# Patient Record
Sex: Female | Born: 1935 | Race: White | Hispanic: No | Smoking: Former smoker
Health system: Southern US, Community
[De-identification: ages and names within clinical notes are randomized; demographics above are authoritative.]

## PROBLEM LIST (undated history)

## (undated) DIAGNOSIS — F0280 Dementia in other diseases classified elsewhere without behavioral disturbance: Secondary | ICD-10-CM

## (undated) DIAGNOSIS — I4891 Unspecified atrial fibrillation: Secondary | ICD-10-CM

## (undated) DIAGNOSIS — I739 Peripheral vascular disease, unspecified: Secondary | ICD-10-CM

## (undated) DIAGNOSIS — J449 Chronic obstructive pulmonary disease, unspecified: Secondary | ICD-10-CM

## (undated) DIAGNOSIS — Z7901 Long term (current) use of anticoagulants: Secondary | ICD-10-CM

## (undated) DIAGNOSIS — K219 Gastro-esophageal reflux disease without esophagitis: Secondary | ICD-10-CM

## (undated) DIAGNOSIS — Z973 Presence of spectacles and contact lenses: Secondary | ICD-10-CM

## (undated) DIAGNOSIS — N289 Disorder of kidney and ureter, unspecified: Secondary | ICD-10-CM

## (undated) DIAGNOSIS — I2510 Atherosclerotic heart disease of native coronary artery without angina pectoris: Secondary | ICD-10-CM

## (undated) DIAGNOSIS — I10 Essential (primary) hypertension: Secondary | ICD-10-CM

## (undated) DIAGNOSIS — G459 Transient cerebral ischemic attack, unspecified: Secondary | ICD-10-CM

## (undated) DIAGNOSIS — I509 Heart failure, unspecified: Secondary | ICD-10-CM

## (undated) DIAGNOSIS — F17201 Nicotine dependence, unspecified, in remission: Secondary | ICD-10-CM

## (undated) DIAGNOSIS — E785 Hyperlipidemia, unspecified: Secondary | ICD-10-CM

## (undated) DIAGNOSIS — I679 Cerebrovascular disease, unspecified: Secondary | ICD-10-CM

## (undated) DIAGNOSIS — H9190 Unspecified hearing loss, unspecified ear: Secondary | ICD-10-CM

## (undated) DIAGNOSIS — Z970 Presence of artificial eye: Secondary | ICD-10-CM

## (undated) DIAGNOSIS — M40209 Unspecified kyphosis, site unspecified: Secondary | ICD-10-CM

## (undated) DIAGNOSIS — G309 Alzheimer's disease, unspecified: Secondary | ICD-10-CM

## (undated) HISTORY — DX: Unspecified kyphosis, site unspecified: M40.209

## (undated) HISTORY — PX: ENUCLEATION: SHX628

## (undated) HISTORY — PX: ABDOMINAL HYSTERECTOMY: SHX81

## (undated) HISTORY — DX: Long term (current) use of anticoagulants: Z79.01

## (undated) HISTORY — DX: Peripheral vascular disease, unspecified: I73.9

## (undated) HISTORY — DX: Heart failure, unspecified: I50.9

## (undated) HISTORY — DX: Unspecified atrial fibrillation: I48.91

## (undated) HISTORY — DX: Essential (primary) hypertension: I10

## (undated) HISTORY — PX: TONSILLECTOMY: SUR1361

## (undated) HISTORY — DX: Atherosclerotic heart disease of native coronary artery without angina pectoris: I25.10

## (undated) HISTORY — DX: Nicotine dependence, unspecified, in remission: F17.201

## (undated) HISTORY — DX: Cerebrovascular disease, unspecified: I67.9

## (undated) HISTORY — PX: APPENDECTOMY: SHX54

## (undated) HISTORY — DX: Hyperlipidemia, unspecified: E78.5

---

## 1998-03-12 ENCOUNTER — Ambulatory Visit: Admission: RE | Admit: 1998-03-12 | Discharge: 1998-03-12 | Payer: Self-pay | Admitting: Cardiovascular Disease

## 2002-01-29 ENCOUNTER — Ambulatory Visit (HOSPITAL_COMMUNITY): Admission: RE | Admit: 2002-01-29 | Discharge: 2002-01-29 | Payer: Self-pay | Admitting: Family Medicine

## 2002-01-29 ENCOUNTER — Encounter: Payer: Self-pay | Admitting: Family Medicine

## 2003-11-10 ENCOUNTER — Ambulatory Visit (HOSPITAL_COMMUNITY): Admission: RE | Admit: 2003-11-10 | Discharge: 2003-11-10 | Payer: Self-pay | Admitting: Ophthalmology

## 2005-06-13 ENCOUNTER — Ambulatory Visit (HOSPITAL_COMMUNITY): Admission: RE | Admit: 2005-06-13 | Discharge: 2005-06-13 | Payer: Self-pay | Admitting: Family Medicine

## 2005-10-30 ENCOUNTER — Emergency Department (HOSPITAL_COMMUNITY): Admission: EM | Admit: 2005-10-30 | Discharge: 2005-10-30 | Payer: Self-pay | Admitting: Emergency Medicine

## 2005-11-01 ENCOUNTER — Ambulatory Visit: Payer: Self-pay | Admitting: *Deleted

## 2005-11-08 ENCOUNTER — Ambulatory Visit: Payer: Self-pay | Admitting: *Deleted

## 2005-11-08 ENCOUNTER — Encounter (HOSPITAL_COMMUNITY): Admission: RE | Admit: 2005-11-08 | Discharge: 2005-12-08 | Payer: Self-pay | Admitting: *Deleted

## 2005-11-25 ENCOUNTER — Ambulatory Visit: Payer: Self-pay | Admitting: Cardiology

## 2006-03-12 ENCOUNTER — Encounter: Payer: Self-pay | Admitting: Emergency Medicine

## 2006-03-12 ENCOUNTER — Inpatient Hospital Stay (HOSPITAL_COMMUNITY): Admission: EM | Admit: 2006-03-12 | Discharge: 2006-03-20 | Payer: Self-pay | Admitting: Cardiovascular Disease

## 2006-03-12 ENCOUNTER — Ambulatory Visit: Payer: Self-pay | Admitting: Pulmonary Disease

## 2006-03-17 ENCOUNTER — Encounter: Payer: Self-pay | Admitting: Cardiovascular Disease

## 2006-03-17 ENCOUNTER — Ambulatory Visit: Payer: Self-pay | Admitting: Cardiovascular Disease

## 2006-11-09 ENCOUNTER — Emergency Department (HOSPITAL_COMMUNITY): Admission: EM | Admit: 2006-11-09 | Discharge: 2006-11-09 | Payer: Self-pay | Admitting: Emergency Medicine

## 2006-12-04 ENCOUNTER — Ambulatory Visit: Payer: Self-pay | Admitting: Cardiology

## 2006-12-07 ENCOUNTER — Ambulatory Visit (HOSPITAL_COMMUNITY): Admission: RE | Admit: 2006-12-07 | Discharge: 2006-12-07 | Payer: Self-pay | Admitting: Cardiology

## 2007-11-08 ENCOUNTER — Ambulatory Visit (HOSPITAL_COMMUNITY): Admission: RE | Admit: 2007-11-08 | Discharge: 2007-11-08 | Payer: Self-pay | Admitting: Family Medicine

## 2008-02-05 ENCOUNTER — Ambulatory Visit: Payer: Self-pay | Admitting: Cardiology

## 2008-10-01 ENCOUNTER — Emergency Department (HOSPITAL_COMMUNITY): Admission: EM | Admit: 2008-10-01 | Discharge: 2008-10-01 | Payer: Self-pay | Admitting: Emergency Medicine

## 2008-11-25 ENCOUNTER — Ambulatory Visit (HOSPITAL_COMMUNITY): Admission: RE | Admit: 2008-11-25 | Discharge: 2008-11-25 | Payer: Self-pay | Admitting: Family Medicine

## 2009-02-20 ENCOUNTER — Encounter: Payer: Self-pay | Admitting: Cardiology

## 2009-02-20 ENCOUNTER — Ambulatory Visit: Payer: Self-pay | Admitting: Cardiology

## 2009-02-20 DIAGNOSIS — M4000 Postural kyphosis, site unspecified: Secondary | ICD-10-CM | POA: Insufficient documentation

## 2009-07-07 ENCOUNTER — Encounter (INDEPENDENT_AMBULATORY_CARE_PROVIDER_SITE_OTHER): Payer: Self-pay | Admitting: *Deleted

## 2009-07-07 LAB — CONVERTED CEMR LAB
MCV: 98.1 fL
Platelets: 257 10*3/uL
WBC: 8.7 10*3/uL

## 2009-07-08 ENCOUNTER — Encounter (INDEPENDENT_AMBULATORY_CARE_PROVIDER_SITE_OTHER): Payer: Self-pay | Admitting: *Deleted

## 2009-09-08 ENCOUNTER — Emergency Department (HOSPITAL_COMMUNITY): Admission: EM | Admit: 2009-09-08 | Discharge: 2009-09-08 | Payer: Self-pay | Admitting: Emergency Medicine

## 2009-12-14 ENCOUNTER — Ambulatory Visit (HOSPITAL_COMMUNITY): Admission: RE | Admit: 2009-12-14 | Discharge: 2009-12-14 | Payer: Self-pay | Admitting: Family Medicine

## 2010-02-09 ENCOUNTER — Encounter (INDEPENDENT_AMBULATORY_CARE_PROVIDER_SITE_OTHER): Payer: Self-pay | Admitting: *Deleted

## 2010-02-09 LAB — CONVERTED CEMR LAB
AST: 17 units/L
BUN: 24 mg/dL
Basophils Relative: 0 %
CO2: 33 meq/L
GFR calc non Af Amer: 37 mL/min
Glomerular Filtration Rate, Af Am: 44 mL/min/{1.73_m2}
Glucose, Bld: 87 mg/dL
Lymphocytes Relative: 16 %
Lymphs Abs: 1.9 10*3/uL
Platelets: 249 10*3/uL
Sodium: 144 meq/L
Total Protein: 6.6 g/dL

## 2010-02-19 ENCOUNTER — Ambulatory Visit (HOSPITAL_COMMUNITY): Admission: RE | Admit: 2010-02-19 | Discharge: 2010-02-19 | Payer: Self-pay | Admitting: Family Medicine

## 2010-03-30 ENCOUNTER — Encounter (INDEPENDENT_AMBULATORY_CARE_PROVIDER_SITE_OTHER): Payer: Self-pay | Admitting: *Deleted

## 2010-03-31 ENCOUNTER — Ambulatory Visit: Payer: Self-pay | Admitting: Cardiology

## 2010-03-31 DIAGNOSIS — I10 Essential (primary) hypertension: Secondary | ICD-10-CM | POA: Insufficient documentation

## 2010-03-31 DIAGNOSIS — F0390 Unspecified dementia without behavioral disturbance: Secondary | ICD-10-CM | POA: Insufficient documentation

## 2010-03-31 DIAGNOSIS — E785 Hyperlipidemia, unspecified: Secondary | ICD-10-CM | POA: Insufficient documentation

## 2010-04-02 ENCOUNTER — Ambulatory Visit (HOSPITAL_COMMUNITY): Admission: RE | Admit: 2010-04-02 | Discharge: 2010-04-02 | Payer: Self-pay | Admitting: Cardiology

## 2010-04-09 ENCOUNTER — Encounter (INDEPENDENT_AMBULATORY_CARE_PROVIDER_SITE_OTHER): Payer: Self-pay | Admitting: *Deleted

## 2010-05-07 ENCOUNTER — Emergency Department (HOSPITAL_COMMUNITY): Admission: EM | Admit: 2010-05-07 | Discharge: 2010-05-07 | Payer: Self-pay | Admitting: Emergency Medicine

## 2010-07-16 ENCOUNTER — Encounter (INDEPENDENT_AMBULATORY_CARE_PROVIDER_SITE_OTHER): Payer: Self-pay | Admitting: *Deleted

## 2010-07-16 ENCOUNTER — Ambulatory Visit (HOSPITAL_COMMUNITY): Admission: RE | Admit: 2010-07-16 | Discharge: 2010-07-16 | Payer: Self-pay | Admitting: Internal Medicine

## 2010-07-16 LAB — CONVERTED CEMR LAB
BUN: 15 mg/dL
Basophils Absolute: 0.1 10*3/uL
CO2: 29 meq/L
Calcium: 9.7 mg/dL
Creatinine, Ser: 1.18 mg/dL
Eosinophils Absolute: 0.3 10*3/uL
Eosinophils Relative: 4 %
GFR calc non Af Amer: 43 mL/min
HCT: 44.3 %
Hemoglobin: 13.8 g/dL
Monocytes Absolute: 0.8 10*3/uL
RDW: 13.7 %
Sodium: 144 meq/L

## 2010-07-20 ENCOUNTER — Encounter (INDEPENDENT_AMBULATORY_CARE_PROVIDER_SITE_OTHER): Payer: Self-pay | Admitting: *Deleted

## 2010-07-20 ENCOUNTER — Ambulatory Visit: Payer: Self-pay | Admitting: Cardiology

## 2010-08-01 ENCOUNTER — Observation Stay (HOSPITAL_COMMUNITY)
Admission: EM | Admit: 2010-08-01 | Discharge: 2010-08-03 | Payer: Self-pay | Source: Home / Self Care | Admitting: Emergency Medicine

## 2010-08-01 ENCOUNTER — Ambulatory Visit: Payer: Self-pay | Admitting: Cardiology

## 2010-08-02 ENCOUNTER — Encounter: Payer: Self-pay | Admitting: Cardiology

## 2010-08-02 ENCOUNTER — Encounter (INDEPENDENT_AMBULATORY_CARE_PROVIDER_SITE_OTHER): Payer: Self-pay | Admitting: Internal Medicine

## 2010-08-03 ENCOUNTER — Telehealth (INDEPENDENT_AMBULATORY_CARE_PROVIDER_SITE_OTHER): Payer: Self-pay | Admitting: *Deleted

## 2010-08-05 ENCOUNTER — Ambulatory Visit (HOSPITAL_COMMUNITY): Admission: RE | Admit: 2010-08-05 | Payer: Self-pay | Admitting: Cardiology

## 2010-08-13 ENCOUNTER — Ambulatory Visit: Payer: Self-pay | Admitting: Cardiology

## 2010-09-16 ENCOUNTER — Ambulatory Visit: Payer: Self-pay | Admitting: Adult Health

## 2010-10-21 ENCOUNTER — Encounter (INDEPENDENT_AMBULATORY_CARE_PROVIDER_SITE_OTHER): Payer: Self-pay | Admitting: *Deleted

## 2010-10-21 ENCOUNTER — Ambulatory Visit
Admission: RE | Admit: 2010-10-21 | Discharge: 2010-10-21 | Payer: Self-pay | Source: Home / Self Care | Attending: Cardiology | Admitting: Cardiology

## 2010-11-04 NOTE — Progress Notes (Signed)
Summary: PT IS NOT GOING TO DO PFT  Phone Note Call from Patient Call back at Home Phone [phone]   Caller: PT Reason for Call: Lab or Test Results Summary of Call: PT AND FAMILY HAVE DECIDED NOT TO DO PFT. Initial call taken by: Faythe Ghee,  August 03, 2010 2:08 PM  Follow-up for Phone Call        cancelled test Follow-up by: Teressa Lower RN,  August 03, 2010 5:49 PM

## 2010-11-04 NOTE — Miscellaneous (Signed)
Summary: labs cmp,cbcd,bnp,07/16/2010  Clinical Lists Changes  Observations: Added new observation of BNP: 187.3  (07/16/2010 8:17) Added new observation of ABSOLUTE BAS: 0.1 K/uL (07/16/2010 8:17) Added new observation of BASOPHIL %: 1 % (07/16/2010 8:17) Added new observation of EOS ABSLT: 0.3 K/uL (07/16/2010 8:17) Added new observation of % EOS AUTO: 4 % (07/16/2010 8:17) Added new observation of ABSOLUTE MON: 0.8 K/uL (07/16/2010 8:17) Added new observation of MONOCYTE %: 9 % (07/16/2010 8:17) Added new observation of ABS LYMPHOCY: 2.0 K/uL (07/16/2010 8:17) Added new observation of PLATELETK/UL: 248 K/uL (07/16/2010 8:17) Added new observation of RDW: 13.7 % (07/16/2010 8:17) Added new observation of MCHC RBC: 31.2 g/dL (09/81/1914 7:82) Added new observation of MCV: 94.7 fL (07/16/2010 8:17) Added new observation of HCT: 44.3 % (07/16/2010 8:17) Added new observation of HGB: 13.8 g/dL (95/62/1308 6:57) Added new observation of RBC M/UL: 4.68 M/uL (07/16/2010 8:17) Added new observation of WBC COUNT: 9.8 10*3/microliter (07/16/2010 8:17)

## 2010-11-04 NOTE — Miscellaneous (Signed)
Summary: labs cmp 07/16/2010  Clinical Lists Changes  Observations: Added new observation of CALCIUM: 9.7 mg/dL (16/07/9603 5:40) Added new observation of ALBUMIN: 4.1 g/dL (98/08/9146 8:29) Added new observation of PROTEIN, TOT: 6.4 g/dL (56/21/3086 5:78) Added new observation of SGPT (ALT): 10 units/L (07/16/2010 8:23) Added new observation of SGOT (AST): 13 units/L (07/16/2010 8:23) Added new observation of ALK PHOS: 109 units/L (07/16/2010 8:23) Added new observation of GFR AA: 52 mL/min/1.34m2 (07/16/2010 8:23) Added new observation of GFR: 43 mL/min (07/16/2010 8:23) Added new observation of CREATININE: 1.18 mg/dL (46/96/2952 8:41) Added new observation of BUN: 15 mg/dL (32/44/0102 7:25) Added new observation of BG RANDOM: 98 mg/dL (36/64/4034 7:42) Added new observation of CO2 PLSM/SER: 29 meq/L (07/16/2010 8:23) Added new observation of CL SERUM: 105 meq/L (07/16/2010 8:23) Added new observation of K SERUM: 4.2 meq/L (07/16/2010 8:23) Added new observation of NA: 144 meq/L (07/16/2010 8:23)

## 2010-11-04 NOTE — Letter (Signed)
Summary: [place] Results Engineer, agricultural at [facility]  [address], [place] [address]   Phone: [phone]  Fax: [phone]      April 09, 2010 MRN: [id number]   Angela [name] [address] [place], [place]  [address]   Dear Ms. Vazguez,  Your test ordered by Selena Batten has been reviewed by your physician (or physician assistant) and was found to be normal or stable. Your physician (or physician assistant) felt no changes were needed at this time.  ____ Echocardiogram  ____ Cardiac Stress Test  ____ Lab Work  __x__ Peripheral vascular study of arms, legs or neck  ____ CT scan or X-ray  ____ Lung or Breathing test  ____ Other:  No change in medical treatment at this time, per Dr. Dietrich Pates.  If you have any questions feel free to call our office at anytime.   [phone]  Thank you, Teressa Lower RN    Hayward Bing, MD, F.A.C.Gaylord Shih, MD, F.A.C.C Lewayne Bunting, MD, F.A.C.C Nona Dell, MD, F.A.C.C Charlton Haws, MD, Lenise Arena.C.C

## 2010-11-04 NOTE — Miscellaneous (Signed)
Summary: labs cmp,cbcd,tsh,02/09/2010  Clinical Lists Changes  Observations: Added new observation of ABSOLUTE BAS: 0.0 K/uL (02/09/2010 13:26) Added new observation of BASOPHIL %: 0 % (02/09/2010 13:26) Added new observation of EOS ABSLT: 0.2 K/uL (02/09/2010 13:26) Added new observation of % EOS AUTO: 2 % (02/09/2010 13:26) Added new observation of ABSOLUTE MON: 1.0 K/uL (02/09/2010 13:26) Added new observation of MONOCYTE %: 9 % (02/09/2010 13:26) Added new observation of ABS LYMPHOCY: 1.9 K/uL (02/09/2010 13:26) Added new observation of LYMPHS %: 16 % (02/09/2010 13:26) Added new observation of CALCIUM: 10.2 mg/dL (16/07/9603 54:09) Added new observation of ALBUMIN: 4.2 g/dL (81/19/1478 29:56) Added new observation of PROTEIN, TOT: 6.6 g/dL (21/30/8657 84:69) Added new observation of SGPT (ALT): 15 units/L (02/09/2010 13:26) Added new observation of SGOT (AST): 17 units/L (02/09/2010 13:26) Added new observation of ALK PHOS: 109 units/L (02/09/2010 13:26) Added new observation of GFR AA: 44 mL/min/1.37m2 (02/09/2010 13:26) Added new observation of GFR: 37 mL/min (02/09/2010 13:26) Added new observation of CREATININE: 1.37 mg/dL (62/95/2841 32:44) Added new observation of BUN: 24 mg/dL (10/05/7251 66:44) Added new observation of BG RANDOM: 87 mg/dL (03/47/4259 56:38) Added new observation of CO2 PLSM/SER: 33 meq/L (02/09/2010 13:26) Added new observation of CL SERUM: 102 meq/L (02/09/2010 13:26) Added new observation of K SERUM: 4.9 meq/L (02/09/2010 13:26) Added new observation of NA: 144 meq/L (02/09/2010 13:26) Added new observation of PLATELETK/UL: 249 K/uL (02/09/2010 13:26) Added new observation of MCV: 97.2 fL (02/09/2010 13:26) Added new observation of HCT: 45.7 % (02/09/2010 13:26) Added new observation of HGB: 13.7 g/dL (75/64/3329 51:88) Added new observation of WBC COUNT: 11.7 10*3/microliter (02/09/2010 13:26) Added new observation of TSH: 2.256 microintl units/mL  (02/09/2010 13:26)

## 2010-11-04 NOTE — Assessment & Plan Note (Signed)
Summary: post hosp [facility] per pt's daughter/tg   Visit Type:  Follow-up Primary Provider:  Dr. Dorthey Sawyer  CC:  no cardiology complaints.  History of Present Illness: Angela [name] is a 77 y/o CF with known history of diastolic heart failure, hypertension, afib (refuses coumadin) dementia and COPD that we are seeing on hospital follow-up after admission for CHF exacerbation and bronchiits.  She was appropriately diuresed and also placed on abx therapy.  She is not a candidate for beta blocker as she is rate controlled and does not tolerate well secondary to bradycardia.  She is not on any rate control medications.  She is without complaint at this time.  She is here with her daughter who manages her medications and monitors her wt.  There is a sitter that comes daily who bathes her and weighs her.  The patient denies chest pain, shortness of breath, cough or edema.  Current Medications (verified): 1)  Pravachol 40 Mg Tabs (Pravastatin Sodium) .... Take 1 Tablet By Mouth At Bedtime 2)  Namenda 10 Mg Tabs (Memantine Hcl) .... Take 1 Tab Two Times A Day 3)  Zyrtec Allergy 10 Mg Caps (Cetirizine Hcl) .... Take 1 Tab Daily 4)  Aspirin 81 Mg Tbec (Aspirin) .... Take 1 Tab Daily 5)  Daily Multi  Tabs (Multiple Vitamins-Minerals) .... Take 1 Tab Daily 6)  Vitamin C Cr 500 Mg Cr-Caps (Ascorbic Acid) .... Take 1 Tab Daily 7)  Vitamin D 400 Unit Tabs (Cholecalciferol) .... Take 1 Cap Daily 8)  Fish Oil 1000 Mg Caps (Omega-3 Fatty Acids) .... Take 1 Cap Daily 9)  Polyethylene Glycol 3350  Pack (Polyethylene Glycol 3350) .... Use As Needed 10)  Clobetasol Propionate 0.05 % Crea (Clobetasol Propionate) .... Apply Two Times A Day 11)  Salex 6 % Sham (Salicylic Acid) 12)  Furosemide 40 Mg Tabs (Furosemide) .... Take One Tablet By Mouth Daily. 13)  Klor-Con M20 20 Meq Cr-Tabs (Potassium Chloride Crys Cr) .... Take 1 Tab Daily 14)  Boniva 150 Mg Tabs (Ibandronate Sodium) .... Take 1 Tab Monthly 15)   Lorazepam 0.5 Mg Tabs (Lorazepam) .... 1/4 Tab Am 1/2 Tab Qhs 16)  Aricept 5 Mg Tabs (Donepezil Hcl) .... Take 1 Tab Two Times A Day 17)  Axona  Pack (Dietary Management Product) .... 1/2 Pack in The Am 18)  Combivent 18-103 Mcg/act Aero (Ipratropium-Albuterol) .... Use 1 Puff Q6hrs As Needed  Allergies (verified): No Known Drug Allergies  Comments:  Nurse/Medical Assistant: reviewed med list with daughter  Review of Systems       All other systems have been reviewed and are negative unless stated above.   Vital Signs:  Patient profile:   77 year old female Weight:      141 pounds BMI:     27.64 Pulse rate:   93 / minute BP sitting:   127 / 69  (right arm)  Vitals Entered By: Angela Saa, CNA (August 13, 2010 1:16 PM)  Physical Exam  General:  Well developed, well nourished, in no acute distress. Lungs:  Clear bilaterally to auscultation and percussion. Heart:  Non-displaced PMI, chest non-tender; regular rate and rhythm, S1, S2 without murmurs, rubs or gallops. Carotid upstroke normal, no bruit. Normal abdominal aortic size, no bruits. Femorals normal pulses, no bruits. Pedals normal pulses. No edema, no varicosities. Msk:  Back normal, normal gait. Muscle strength and tone normal. Pulses:  pulses normal in all 4 extremities Extremities:  trace left pedal edema and trace right pedal  edema.   Neurologic:  Alert and oriented x 3. Psych:  Normal affect.   Impression & Recommendations:  Problem # 1:  ATHEROSCLEROTIC CARDIOVASCULAR DISEASE (ICD-429.2) Assessment Unchanged She is not on BB due to bradycardia.    Problem # 2:  HYPERTENSION (ICD-401.9) Well controlled at present. The following medications were removed from the medication list:    Lisinopril 5 Mg Tabs (Lisinopril) .Marland Kitchen... Take 1 tab daily    Carvedilol 6.25 Mg Tabs (Carvedilol) .Marland Kitchen... Take one tablet by mouth twice a day Her updated medication list for this problem includes:    Aspirin 81 Mg Tbec  (Aspirin) .Marland Kitchen... Take 1 tab daily    Furosemide 40 Mg Tabs (Furosemide) .Marland Kitchen... Take one tablet by mouth daily.  Problem # 3:  CHRONIC DIASTOLIC HEART FAILURE (ICD-428.32) She is euvolemic at present.  She is advised on a low salt diet, the need to buy a digital scale and weigh at the same time daily.  If she gains 2-3 lbs in 1-2 days, or 5 lbs in a week she is to call.  She is to talk Lasix on an empty stomach.  She will be seen in one month for close follow-up of weight and symptoms. The following medications were removed from the medication list:    Lisinopril 5 Mg Tabs (Lisinopril) .Marland Kitchen... Take 1 tab daily    Carvedilol 6.25 Mg Tabs (Carvedilol) .Marland Kitchen... Take one tablet by mouth twice a day Her updated medication list for this problem includes:    Aspirin 81 Mg Tbec (Aspirin) .Marland Kitchen... Take 1 tab daily    Furosemide 40 Mg Tabs (Furosemide) .Marland Kitchen... Take one tablet by mouth daily.  Patient Instructions: 1)  Your physician recommends that you schedule a follow-up appointment in: 1 MONTH 2)  Your physician recommends that you weigh, daily, at the same time every day, and in the same amount of clothing.  Please record your daily weights on the handout provided and bring it to your next appointment. RECORD AND BRING TO OFFICE VISIT

## 2010-11-04 NOTE — Assessment & Plan Note (Signed)
Summary: Angela.[name]   Primary Provider:  Dr. Dorthey Sawyer   History of Present Illness: Angela [name] is reevaluated in the office today for coronary artery disease and a history of congestive heart failure with preserved LV systolic function.  Since her last visit, she has done well with no dyspnea, orthopnea, chest pain, lightheadedness nor syncope.  She has carefully followed weights and blood pressures for the past few months.  The former have varied from 135-139 pounds with the lowest values in recent days.  Blood pressure has varied from 90-130 systolic and 60-95 diastolic.     Preventive Screening-Counseling & Management  Alcohol-Tobacco     Smoking Status: quit  Current Medications (verified): 1)  Pravachol 40 Mg Tabs (Pravastatin Sodium) .... Take 1 Tablet By Mouth At Bedtime 2)  Namenda 10 Mg Tabs (Memantine Hcl) .... Take 1 Tab Two Times A Day 3)  Zyrtec Allergy 10 Mg Caps (Cetirizine Hcl) .... Take 1 Tab Daily 4)  Aspirin 81 Mg Tbec (Aspirin) .... Take 1 Tab Daily 5)  Daily Multi  Tabs (Multiple Vitamins-Minerals) .... Take 1 Tab Daily 6)  Vitamin C Cr 500 Mg Cr-Caps (Ascorbic Acid) .... Take 1 Tab Daily 7)  Vitamin D 400 Unit Tabs (Cholecalciferol) .... Take 1 Cap Daily 8)  Polyethylene Glycol 3350  Pack (Polyethylene Glycol 3350) .... Use As Needed 9)  Clobetasol Propionate 0.05 % Crea (Clobetasol Propionate) .... Apply Two Times A Day 10)  Salex 6 % Sham (Salicylic Acid) 11)  Furosemide 40 Mg Tabs (Furosemide) .... Take One Tablet By Mouth Daily. 12)  Klor-Con M20 20 Meq Cr-Tabs (Potassium Chloride Crys Cr) .... Take 1 Tab Daily 13)  Boniva 150 Mg Tabs (Ibandronate Sodium) .... Take 1 Tab Monthly 14)  Lorazepam 0.5 Mg Tabs (Lorazepam) .... 1/4 Tab Am 1/2 Tab Qhs 15)  Aricept 5 Mg Tabs (Donepezil Hcl) .... Take 1 Tablets Once Daily 16)  Axona  Pack (Dietary Management Product) .... 1/2 Pack in The Am 17)  Combivent 18-103 Mcg/act Aero (Ipratropium-Albuterol) .... Use 1 Puff  Q6hrs As Needed 18)  Fish Oil 1000 Mg Caps (Omega-3 Fatty Acids) .... Take 1 Tablet By Mouth Two Times A Day  Allergies (verified): 1)  ! * Sulfa  Past History:  PMH, FH, and Social History reviewed and updated.  Past Medical History: ASCVD: Acute IMI in 1993; 40% left main, 80% LAD, 100% RCA-PTCA; EF of 40%; Stress nuclear in 2007-       normal EF;  mixed inferolateral ischemia and infarction; CHF with normal EF in 2011 Hypertension Hyperlipidemia Cerebrovascular disease: CVA by CT In 2007, but not noted in 2009; Duplex in 3/08-plaque without stenosis Tobacco abuse: 60 pack years; quit in 1993 Claudication Dementia-mild Kyphosis-severe Decreased hearing acuity  Social History: Smoking Status:  quit  Review of Systems       See history of present illness.  Vital Signs:  Patient profile:   77 year old female Weight:      137 pounds O2 Sat:      93 % on Room air Pulse rate:   100 / minute BP sitting:   115 / 64  (left arm)  Vitals Entered By: [name] Via LPN (October 21, 2010 3:03 PM)  O2 Flow:  Room air  Physical Exam  General:  Proportionate weight and height; well developed; no acute distress; mentally sharp, but with markedly impaired hearing acuity Neck-No JVD; no carotid bruits; visible carotid pulsations Lungs-No tachypnea, no rales; no rhonchi; no wheezes;  severe kyphosis Cardiovascular-normal PMI; normal S1 and S2; grade 2-3/6 systolic ejection murmur at the left sternal border and cardiac base Abdomen-BS normal; soft and non-tender without masses or organomegaly:  Musculoskeletal-No deformities, no cyanosis or clubbing: Neurologic-Normal cranial nerves; symmetric strength and tone:  Skin-Warm, no significant lesions: Extremities-Nl distal pulses; no edema:   Head:      Impression & Recommendations:  Problem # 1:  CHRONIC DIASTOLIC HEART FAILURE (ICD-428.32) Patient is well compensated with respect to CHF; current management will be continued.  She is  cautioned to call for a 5 pound weight gain and will continue to weigh daily at home.  Problem # 2:  ATHEROSCLEROTIC CARDIOVASCULAR DISEASE (ICD-429.2) No symptoms of myocardial ischemia at present.  Management of this problem will focus on optimal control of risk factors.  Problem # 3:  HYPERLIPIDEMIA (ICD-272.4) Control was excellent 3 months ago; current therapy will be continued.  Problem # 4:  HYPERTENSION (ICD-401.9) Blood pressure control is excellent; current therapy will be continued.  BP today: 115/64 Prior BP: 127/69 (08/13/2010)  Labs Reviewed: K+: 4.2 (07/16/2010) Creat: : 1.18 (07/16/2010)   Other Orders: Future Orders: T-Basic Metabolic Panel [id number]) ... 02/18/2011  Patient Instructions: 1)  Your physician recommends that you schedule a follow-up appointment in: 7 months 2)  Your physician recommends that you return for lab work in: 4 months 3)  Your physician has recommended you make the following change in your medication: fish oil 1000mg  two times a day 4)  Your physician recommends that you weigh, daily, at the same time every day, and in the same amount of clothing.  Please record your daily weights on the handout provided and bring it to your next appointment. call for wt gain 5lbs  Prevention & Chronic Care Immunizations   Influenza vaccine: Not documented    Tetanus booster: Not documented    Pneumococcal vaccine: Not documented    H. zoster vaccine: Not documented  Colorectal Screening   Hemoccult: Not documented    Colonoscopy: Not documented  Other Screening   Pap smear: Not documented    Mammogram: Not documented    DXA bone density scan: Not documented   Smoking status: quit  (10/21/2010)    Screening comments: quit 56yrs. ago  Lipids   Total Cholesterol: 140  (07/07/2009)   LDL: 65  (07/07/2009)   LDL Direct: Not documented   HDL: 58  (07/07/2009)   Triglycerides: 84  (07/07/2009)    SGOT (AST): 13  (07/16/2010)   SGPT  (ALT): 10  (07/16/2010)   Alkaline phosphatase: 109  (07/16/2010)   Total bilirubin: Not documented  Hypertension   Last Blood Pressure: 115 / 64  (10/21/2010)   Serum creatinine: 1.18  (07/16/2010)   Serum potassium 4.2  (07/16/2010)  Self-Management Support :    Hypertension self-management support: Not documented    Lipid self-management support: Not documented

## 2010-11-04 NOTE — Assessment & Plan Note (Signed)
Summary: 1 yr /fu per checkout on 02/20/09/tg   Visit Type:  Follow-up Primary Provider:  Dr. Dorthey Sawyer   History of Present Illness: Angela [name] returns to the office as scheduled for continued assessment and treatment of coronary artery disease and multiple cardiovascular risk factors.  Since her last visit one year ago, she has done generally well.  She was admitted to hospital in [place] approximately 12 months ago with asthmatic bronchitis.  Eventually, all pulmonary medications were discontinued, but she continues to have oxygen available at home, which he rarely utilizes.  She has had no chest discomfort nor dyspnea.  She was last in our local hospital system in 2009 for an episode of confusion.  A CT scan showed some white matter disease, but no important abnormalities.  Oxygen saturation on room air is 98%.  Patient and her daughter deny orthopnea, PND, cough, sputum, fever, wheezing or weight gain.  A decrease in appetite has been noted without much in the way of weight loss.   Current Medications (verified): 1)  Pravachol 40 Mg Tabs (Pravastatin Sodium) .... Take 1 Tablet By Mouth At Bedtime 2)  Namenda 10 Mg Tabs (Memantine Hcl) .... Take 1 Tab Two Times A Day 3)  Lisinopril 5 Mg Tabs (Lisinopril) .... Take 1 Tab Daily 4)  Zyrtec Allergy 10 Mg Caps (Cetirizine Hcl) .... Take 1 Tab Daily 5)  Aspirin 81 Mg Tbec (Aspirin) .... Take 1 Tab Daily 6)  Daily Multi  Tabs (Multiple Vitamins-Minerals) .... Take 1 Tab Daily 7)  Rivastigmine Tartrate 1.5 Mg Caps (Rivastigmine Tartrate) .... Take 1 Tablet By Mouth Two Times A Day 8)  Calci-Chew 1250 Mg Chew (Calcium Carbonate) .... Take 1 Daily 9)  Vitamin C Cr 500 Mg Cr-Caps (Ascorbic Acid) .... Take 1 Tab Daily 10)  Vitamin D 400 Unit Tabs (Cholecalciferol) .... Take 1 Cap Daily 11)  Fish Oil 1000 Mg Caps (Omega-3 Fatty Acids) .... Take 1 Cap Daily 12)  Antacid 500 Mg Chew (Calcium Carbonate Antacid) .... Take 1 Three Times A Day 13)   Haloperidol 0.5 Mg Tabs (Haloperidol) .... Take 1 Tab Three Times A Day 14)  Polyethylene Glycol 3350  Pack (Polyethylene Glycol 3350) .... Use As Needed 15)  Clobetasol Propionate 0.05 % Crea (Clobetasol Propionate) .... Apply Two Times A Day 16)  Salex 6 % Sham (Salicylic Acid) 17)  Furosemide 20 Mg Tabs (Furosemide) .... Take As Needed For Edema 18)  Klor-Con M20 20 Meq Cr-Tabs (Potassium Chloride Crys Cr) .... Take Only With Furosemide 19)  Boniva 150 Mg Tabs (Ibandronate Sodium) .... Take 1 Tab Monthly  Allergies (verified): No Known Drug Allergies  Past History:  PMH, FH, and Social History reviewed and updated.  Past Medical History: ASCVD: Acute IMI in 1993; 40% left main, 80% LAD, 100% RCA-PTCA; EF of 40%; Stress nuclear in 2007-       normal EF;  mixed inferolateral ischemia and infarction HYPERTENSION (ICD-401.9) Hyperlipidemia Cerebrovascular disease: CVA by CT In 2007, but not noted in 2009; Duplex in 3/08-plaque without stenosis Tobacco abuse: 60 pack years; quit in 1993 Claudication Dementia Decreased hearing acuity  Review of Systems       See history of present illness.  Vital Signs:  Patient profile:   77 year old female Weight:      139 pounds BMI:     27.24 Pulse rate:   77 / minute BP sitting:   132 / 75  (right arm)  Vitals Entered By: Angela Saa,  CNA (March 31, 2010 12:57 PM)  Physical Exam  General:  Somewhat overweight; well developed; no acute distress:   Weight-139, 2 pounds more than in 01/2009 Neck-No JVD;soft bilateral carotid bruits: Lungs-No tachypnea, no rales; no rhonchi; no wheezes; moderate kyphosis; coarse breath sounds at the bases Cardiovascular-normal PMI; normal S1 and S2:mild systolic ejection murmur at the cardiac base Abdomen-BS normal; soft and non-tender without masses or organomegaly:  Musculoskeletal-No deformities, no cyanosis or clubbing: Neurologic-Normal cranial nerves; symmetric strength and tone:  Skin-Warm, no  significant lesions: Extremities-Nl distal pulses; 1/2+ edema; surface varicosities     Impression & Recommendations:  Problem # 1:  CAROTID BRUITS, BILATERAL (ICD-785.9) Her most recent duplex study was performed 3 years ago and showed atherosclerosis without critical disease.  A repeat study will be obtained; future studies can probably be deferred entirely or intervals between studies can be lengthened.  Problem # 2:  HYPERTENSION (ICD-401.9) Blood pressure control is excellent.  Problem # 3:  HYPERLIPIDEMIA (ICD-272.4) Lipid profile 8 months ago was excellent with total cholesterol of 140, triglycerides of 84, HDL of 58 and LDL 65.      Current therapy will be continued.      Problem # 4:  ATHEROSCLEROTIC CARDIOVASCULAR DISEASE (ICD-429.2) Patient has had no problems with coronary disease for nearly 20 years.  We continue to optimize control of cardiovascular risk factors, and I will plan to reassess Ms. Rehm again in one year.  Other Orders: Carotid Duplex (Carotid Duplex)  Patient Instructions: 1)  Your physician recommends that you schedule a follow-up appointment in: 1 year 2)  Your physician has requested that you have a carotid duplex. This test is an ultrasound of the carotid arteries in your neck. It looks at blood flow through these arteries that supply the brain with blood. Allow one hour for this exam. There are no restrictions or special instructions.

## 2010-11-04 NOTE — Letter (Signed)
Summary: [name], agricultural at [employer]  [address], [place] [address]   Phone: [phone]  Fax: [phone]     October 21, 2010 MRN: [id number]   Angela [name] [address] [place], [place]  [address]      YOUR LAB WORK IS DUE   Feb 18, 2011  Please go to [facility], located across the street from [facility] on the second floor.  Hours are Monday - Friday 7am until 7:30pm         Saturday 8am until 12noon      _X_ YOUR LABWORK IS NOT FASTING --YOU MAY EAT PRIOR TO LABWORK

## 2010-11-04 NOTE — Letter (Signed)
Summary: [name], agricultural at [employer]  [address] [place] [address]   Phone: [phone]  Fax: [phone]     July 20, 2010 MRN: [id number]   Angela [name] [address] [place], [place]  [address]      YOUR LAB WORK IS DUE   July 27, 2010  Please go to [facility], located across the street from [facility] on the second floor.  Hours are Monday - Friday 7am until 7:30pm         Saturday 8am until 12noon      _X_ YOUR LABWORK IS NOT FASTING --YOU MAY EAT PRIOR TO LABWORK

## 2010-11-04 NOTE — Assessment & Plan Note (Signed)
Summary: **per Dr.Fusco for ? Aortic Stenosis and Biventricular Failur...   Visit Type:  Follow-up Primary Provider:  Dr. Dorthey Sawyer  CC:  .Marland Kitchen  History of Present Illness: [name] returns to the office at the request of her primary care physician for reassessment of cardiac and pulmonary disease.  She has complained of dyspnea and was found to have a systolic murmur raising the question of hemodynamically significant aortic stenosis.  Angela [name] has chronic dyspnea on exertion, known coronary disease having suffered a remote IMI, and multiple cardiovascular risk factors.  She has cerebrovascular disease with CT evidence for a prior CVA but no clinical syndrome for same.  Recent chest x-ray shows marked kyphosis with osteopenia, cardiomegaly, normal lung fields and minimal pleural effusions.  Echocardiogram in 2007 revealed no significant aortic valve disease.  Records were obtained from Dr. Sharyon Medicus office and reviewed  Echocardiogram  Procedure date:  03/17/2006  Findings:      LEFT VENTRICLE:   -  Left ventricular size was normal.   -  Overall left ventricular systolic function was normal.   -  There was mild focal basal septal hypertrophy.   AORTIC VALVE:   -  Nodular calcification of AV especially NCC but no significant         stenosis    Doppler interpretation(s):   -  There was trivial aortic valvular regurgitation.   AORTA:   -  The aortic root was normal in size.   MITRAL VALVE:   Doppler interpretation(s):   -  There was mild mitral valvular regurgitation.   LEFT ATRIUM:   -  The left atrium was mild to moderately dilated.   RIGHT VENTRICLE:   -  Right ventricular size was normal.   -  Right ventricular systolic function was normal.   -  Right ventricular wall thickness was normal.   TRICUSPID VALVE:   Doppler interpretation(s):   -  There was mild tricuspid valvular regurgitation.   RIGHT ATRIUM:   -  Right atrial size was normal.   SYSTEMIC VEINS:   -  The  inferior vena cava was normal.   PERICARDIUM:   -  There was no pericardial effusion.   -  The pericardium was normal in appearance.    ---------------------------------------------------------------   SUMMARY   -  Overall left ventricular systolic function was normal. There was         mild focal basal septal hypertrophy.   -  Nodular calcification of AV especially NCC but no significant         stenosis There was trivial aortic valvular regurgitation.   -  There was mild mitral valvular regurgitation.   -  The left atrium was mild to moderately dilated.    ---------------------------------------------------------------   Prepared and Electronically Authenticated by   Charlton Haws M.D.  EKG  Procedure date:  07/22/2010  Findings:      Atrial fibrillation with controlled ventricular response Heart rate of 88 bpm Right bundle branch block T-Wave abnormality, consider inferolateral ischemia or LVH  Comparison with prior study of 02/20/09: Atrial fibrillation now present; Q waves more prominent in leads 3.  CXR  Procedure date:  07/16/2010  Findings:      Mild cardiomegaly Trace bilateral pleural effusions Marked kyphosis Osteopenia Clear lung fields Probable right midlung granuloma   Current Medications (verified): 1)  Pravachol 40 Mg Tabs (Pravastatin Sodium) .... Take 1 Tablet By Mouth At Bedtime 2)  Namenda 10 Mg Tabs (Memantine Hcl) .Marland KitchenMarland KitchenMarland Kitchen  Take 1 Tab Two Times A Day 3)  Lisinopril 5 Mg Tabs (Lisinopril) .... Take 1 Tab Daily 4)  Zyrtec Allergy 10 Mg Caps (Cetirizine Hcl) .... Take 1 Tab Daily 5)  Aspirin 81 Mg Tbec (Aspirin) .... Take 1 Tab Daily 6)  Daily Multi  Tabs (Multiple Vitamins-Minerals) .... Take 1 Tab Daily 7)  Vitamin C Cr 500 Mg Cr-Caps (Ascorbic Acid) .... Take 1 Tab Daily 8)  Vitamin D 400 Unit Tabs (Cholecalciferol) .... Take 1 Cap Daily 9)  Fish Oil 1000 Mg Caps (Omega-3 Fatty Acids) .... Take 1 Cap Daily 10)  Polyethylene Glycol 3350  Pack  (Polyethylene Glycol 3350) .... Use As Needed 11)  Clobetasol Propionate 0.05 % Crea (Clobetasol Propionate) .... Apply Two Times A Day 12)  Salex 6 % Sham (Salicylic Acid) 13)  Furosemide 40 Mg Tabs (Furosemide) .... Take One Tablet By Mouth Daily. 14)  Klor-Con M20 20 Meq Cr-Tabs (Potassium Chloride Crys Cr) .... Take 1 Tab Daily 15)  Boniva 150 Mg Tabs (Ibandronate Sodium) .... Take 1 Tab Monthly 16)  Lorazepam 0.5 Mg Tabs (Lorazepam) .... 1/4 Tab Am 1/2 Tab Qhs 17)  Aricept 5 Mg Tabs (Donepezil Hcl) .... Take 1 Tab Daily  Allergies (verified): No Known Drug Allergies  Comments:  Nurse/Medical Assistant: patient brought med list reviewed with son and daughter  Past History:  PMH, FH, and Social History reviewed and updated.  Review of Systems       No lightheadedness, syncope, orthopnea, chest pain or PND.  Denies abdominal pain or other GI symptoms.  No focal neurologic symptoms.  Vital Signs:  Patient profile:   77 year old female Weight:      141 pounds BMI:     27.64 Pulse rate:   81 / minute BP sitting:   123 / 72  (right arm)  Vitals Entered By: Dreama Saa, CNA (July 20, 2010 3:13 PM)  Physical Exam  General:  Overweight; well developed; no acute distress:   Weight-141, 2 pounds more than in 03/2010 Neck-No JVD;soft bilateral carotid bruits: Lungs-No tachypnea, no rales; no rhonchi; no wheezes; moderate to marked kyphosis; coarse breath sounds at the bases Cardiovascular-normal PMI; normal S1 and S2:mild systolic ejection murmur at the cardiac base Abdomen-BS normal; soft and non-tender without masses or organomegaly:  Musculoskeletal-No deformities, no cyanosis or clubbing: Neurologic-Normal cranial nerves; symmetric strength and tone:  Skin-Warm, no significant lesions: Extremities-Nl distal pulses; 1-2+ edema; surface varicosities     EKG  Procedure date:  07/16/2010  Findings:       Findings: Marked kyphosis noted with osteopenia but no  overt   compression deformity noted.  Bilateral trace pleural effusions are   noted.  Heart size is mildly enlarged without edema.  Mild   prominence of the central hila is stable.  No new focal pulmonary   opacity.  Probable right mid lung zone granuloma stable.    IMPRESSION:   Mild cardiomegaly without edema, trace pleural effusions.    Read By:  Harrel Lemon,  MD   Released By:  Harrel Lemon,  MD  Impression & Recommendations:  Problem # 1:  ATHEROSCLEROTIC CARDIOVASCULAR DISEASE (ICD-429.2) Patient has no symptoms to suggest recurrent myocardial ischemia.  Exertional dyspnea is likely related to pulmonary problems related to kyphosis.  Pedal edema has increased, but does not necessarily reflect CHF.  Echocardiography will be performed to assess left ventricular systolic function and to evaluate the aortic valve.  By exam, aortic stenosis is trivial  to mild.  PFTs will also be performed, but will almost certainly indicates significant ventilatory abnormalities.  Furosemide dosage will be increased.  Renal function and electrolytes will be followed.  Problem # 2:  HYPERLIPIDEMIA (ICD-272.4) Lipid profile was excellent one year ago; repeat testing will be performed.  CHOL: 140 (07/07/2009)   LDL: 65 (07/07/2009)   HDL: 58 (07/07/2009)   TG: 84 (07/07/2009)  Problem # 3:  HYPERTENSION (ICD-401.9) Blood pressure control appears excellent on current medications, which will be continued.  I will plan to reassess this very nice woman in 3 weeks.  Other Orders: Hemoccult Cards (Take Home) (Hemoccult Cards) 2-D Echocardiogram (2D Echo) Pulmonary Function Test (PFT) Future Orders: T-CBC w/Diff ([id number]) ... 07/27/2010 T-TSH [phone]) ... 07/27/2010 T-BNP  (B Natriuretic Peptide) [phone]) ... 07/27/2010 T-Magnesium [phone]) ... 07/27/2010 T-Basic Metabolic Panel [id number]) ... 07/27/2010  Patient Instructions: 1)  Your physician recommends that you  schedule a follow-up appointment in: 3 WEEKS 2)  Your physician recommends that you return for lab work in: 1 WEEL 3)  Your physician has requested that you have an echocardiogram.  Echocardiography is a painless test that uses sound waves to create images of your heart. It provides your doctor with information about the size and shape of your heart and how well your heart's chambers and valves are working.  This procedure takes approximately one hour. There are no restrictions for this procedure. 4)  Your physician has recommended that you have a pulmonary function test.  Pulmonary Function Tests are a group of tests that measure how well air moves in and out of your lungs. 5)  Your physician has recommended you make the following change in your medication: START CARVEDILOL 6.25MG  TWICE DAILY, CHANGE FUROSEMIDE TO 40MG  DAILY Prescriptions: CARVEDILOL 6.25 MG TABS (CARVEDILOL) Take one tablet by mouth twice a day  #60 x 3   Entered by:   Teressa Lower RN   Authorized by:   Kathlen Brunswick, MD, [facility]   Signed by:   Teressa Lower RN on 07/20/2010   Method used:   Electronically to        [facility] [phone]* (retail)       [address]       [place], [place]  [address]       Ph: [phone] or [phone]       Fax: [phone]   RxID:   [id number] FUROSEMIDE 40 MG TABS (FUROSEMIDE) Take one tablet by mouth daily.  #30 x 3   Entered by:   Teressa Lower RN   Authorized by:   Kathlen Brunswick, MD, [facility]   Signed by:   Teressa Lower RN on 07/20/2010   Method used:   Electronically to        [facility] [phone]* (retail)       [address]       [place], [place]  [address]       Ph: [phone] or [phone]       Fax: [phone]   RxID:   [id number]

## 2010-11-04 NOTE — Miscellaneous (Signed)
Summary: Echo  Clinical Lists Changes  Observations: Added new observation of ECHOINTERP: Study Conclusions    - Left ventricle: The cavity size was normal. Wall thickness was     normal. The estimated ejection fraction was 55%. There is     hypokinesis of the basal-mid inferior myocardium. The study is not     technically sufficient to allow evaluation of LV diastolic     function.   - Aortic valve: Probably trileaflet; moderately calcified leaflets,     particularly noncoronary cusp. Cusp separation was mildly reduced.     There was mild stenosis. Trivial regurgitation. Mean gradient:     11mm Hg (S). Peak gradient: 18mm Hg (S). Valve area:     1.02cm^2(VTI). Valve area: 1.03cm^2 (Vmax).   - Mitral valve: Mildly thickened leaflets . Moderate regurgitation.   - Left atrium: The atrium was moderately dilated.   - Tricuspid valve: Trivial regurgitation.   - Pericardium, extracardiac: There was no pericardial effusion.   Transthoracic echocardiography. M-mode, complete 2D, spectral   Doppler, and color Doppler. Patient status: Inpatient. Location:   Bedside.    --------------------------------------------------------------------    --------------------------------------------------------------------  (08/02/2010 11:19)      Echocardiogram  Procedure date:  08/02/2010  Findings:      Study Conclusions    - Left ventricle: The cavity size was normal. Wall thickness was     normal. The estimated ejection fraction was 55%. There is     hypokinesis of the basal-mid inferior myocardium. The study is not     technically sufficient to allow evaluation of LV diastolic     function.   - Aortic valve: Probably trileaflet; moderately calcified leaflets,     particularly noncoronary cusp. Cusp separation was mildly reduced.     There was mild stenosis. Trivial regurgitation. Mean gradient:     11mm Hg (S). Peak gradient: 18mm Hg (S). Valve area:     1.02cm^2(VTI). Valve area:  1.03cm^2 (Vmax).   - Mitral valve: Mildly thickened leaflets . Moderate regurgitation.   - Left atrium: The atrium was moderately dilated.   - Tricuspid valve: Trivial regurgitation.   - Pericardium, extracardiac: There was no pericardial effusion.   Transthoracic echocardiography. M-mode, complete 2D, spectral   Doppler, and color Doppler. Patient status: Inpatient. Location:   Bedside.    --------------------------------------------------------------------    --------------------------------------------------------------------

## 2010-12-10 ENCOUNTER — Emergency Department (HOSPITAL_COMMUNITY)
Admission: EM | Admit: 2010-12-10 | Discharge: 2010-12-11 | Disposition: A | Discharge status: Home / Self Care | Payer: PRIVATE HEALTH INSURANCE | Attending: Emergency Medicine | Admitting: Emergency Medicine

## 2010-12-10 ENCOUNTER — Emergency Department (HOSPITAL_COMMUNITY): Payer: PRIVATE HEALTH INSURANCE

## 2010-12-10 DIAGNOSIS — I10 Essential (primary) hypertension: Secondary | ICD-10-CM | POA: Insufficient documentation

## 2010-12-10 DIAGNOSIS — G459 Transient cerebral ischemic attack, unspecified: Secondary | ICD-10-CM | POA: Insufficient documentation

## 2010-12-10 DIAGNOSIS — F0280 Dementia in other diseases classified elsewhere without behavioral disturbance: Secondary | ICD-10-CM | POA: Insufficient documentation

## 2010-12-10 DIAGNOSIS — R5383 Other fatigue: Secondary | ICD-10-CM | POA: Insufficient documentation

## 2010-12-10 DIAGNOSIS — N390 Urinary tract infection, site not specified: Secondary | ICD-10-CM | POA: Insufficient documentation

## 2010-12-10 DIAGNOSIS — G309 Alzheimer's disease, unspecified: Secondary | ICD-10-CM | POA: Insufficient documentation

## 2010-12-10 DIAGNOSIS — Z79899 Other long term (current) drug therapy: Secondary | ICD-10-CM | POA: Insufficient documentation

## 2010-12-10 DIAGNOSIS — R42 Dizziness and giddiness: Secondary | ICD-10-CM | POA: Insufficient documentation

## 2010-12-10 DIAGNOSIS — I2510 Atherosclerotic heart disease of native coronary artery without angina pectoris: Secondary | ICD-10-CM | POA: Insufficient documentation

## 2010-12-10 DIAGNOSIS — I252 Old myocardial infarction: Secondary | ICD-10-CM | POA: Insufficient documentation

## 2010-12-10 DIAGNOSIS — R279 Unspecified lack of coordination: Secondary | ICD-10-CM | POA: Insufficient documentation

## 2010-12-10 DIAGNOSIS — R5381 Other malaise: Secondary | ICD-10-CM | POA: Insufficient documentation

## 2010-12-10 DIAGNOSIS — I4891 Unspecified atrial fibrillation: Secondary | ICD-10-CM | POA: Insufficient documentation

## 2010-12-11 ENCOUNTER — Emergency Department (HOSPITAL_COMMUNITY): Payer: PRIVATE HEALTH INSURANCE

## 2010-12-11 LAB — CBC
HCT: 43.5 % (ref 36.0–46.0)
MCH: 29.4 pg (ref 26.0–34.0)
MCHC: 31.7 g/dL (ref 30.0–36.0)
MCV: 92.8 fL (ref 78.0–100.0)
RDW: 14 % (ref 11.5–15.5)

## 2010-12-11 LAB — URINALYSIS, ROUTINE W REFLEX MICROSCOPIC
Bilirubin Urine: NEGATIVE
Ketones, ur: NEGATIVE mg/dL
Nitrite: POSITIVE — AB
Urobilinogen, UA: 0.2 mg/dL (ref 0.0–1.0)
pH: 5.5 (ref 5.0–8.0)

## 2010-12-11 LAB — BASIC METABOLIC PANEL
BUN: 21 mg/dL (ref 6–23)
Calcium: 9.5 mg/dL (ref 8.4–10.5)
GFR calc non Af Amer: 33 mL/min — ABNORMAL LOW (ref >60)
Glucose, Bld: 121 mg/dL — ABNORMAL HIGH (ref 70–99)

## 2010-12-11 LAB — URINE MICROSCOPIC-ADD ON

## 2010-12-11 LAB — DIFFERENTIAL
Eosinophils Relative: 2 % (ref 0–5)
Lymphocytes Relative: 16 % (ref 12–46)
Lymphs Abs: 2 10*3/uL (ref 0.7–4.0)
Monocytes Absolute: 1.1 10*3/uL — ABNORMAL HIGH (ref 0.1–1.0)

## 2010-12-14 LAB — CBC
HCT: 38.9 % (ref 36.0–46.0)
Hemoglobin: 12.8 g/dL (ref 12.0–15.0)
MCH: 29.8 pg (ref 26.0–34.0)
MCHC: 32.8 g/dL (ref 30.0–36.0)
MCV: 90.6 fL (ref 78.0–100.0)
Platelets: 181 K/uL (ref 150–400)
RBC: 4.3 MIL/uL (ref 3.87–5.11)
RDW: 13.5 % (ref 11.5–15.5)
WBC: 8.1 K/uL (ref 4.0–10.5)

## 2010-12-14 LAB — CARDIAC PANEL(CRET KIN+CKTOT+MB+TROPI)
CK, MB: 0.8 ng/mL (ref 0.3–4.0)
Relative Index: INVALID (ref 0.0–2.5)
Total CK: 22 U/L (ref 7–177)

## 2010-12-14 LAB — BASIC METABOLIC PANEL WITH GFR
BUN: 19 mg/dL (ref 6–23)
CO2: 33 meq/L — ABNORMAL HIGH (ref 19–32)
Calcium: 9 mg/dL (ref 8.4–10.5)
Chloride: 104 meq/L (ref 96–112)
Creatinine, Ser: 1.27 mg/dL — ABNORMAL HIGH (ref 0.4–1.2)
GFR calc non Af Amer: 40 mL/min — ABNORMAL LOW
Glucose, Bld: 98 mg/dL (ref 70–99)
Potassium: 4 meq/L (ref 3.5–5.1)
Sodium: 144 meq/L (ref 135–145)

## 2010-12-14 LAB — DIFFERENTIAL
Basophils Absolute: 0 10*3/uL (ref 0.0–0.1)
Basophils Relative: 0 % (ref 0–1)
Eosinophils Absolute: 0.5 10*3/uL (ref 0.0–0.7)
Monocytes Relative: 10 % (ref 3–12)
Neutro Abs: 4.7 10*3/uL (ref 1.7–7.7)
Neutrophils Relative %: 59 % (ref 43–77)

## 2010-12-15 LAB — CBC
Hemoglobin: 13.1 g/dL (ref 12.0–15.0)
Hemoglobin: 14.6 g/dL (ref 12.0–15.0)
Platelets: 203 10*3/uL (ref 150–400)
Platelets: 226 10*3/uL (ref 150–400)
RBC: 4.31 MIL/uL (ref 3.87–5.11)
RBC: 4.91 MIL/uL (ref 3.87–5.11)
WBC: 8.6 10*3/uL (ref 4.0–10.5)
WBC: 9.4 10*3/uL (ref 4.0–10.5)

## 2010-12-15 LAB — URINALYSIS, ROUTINE W REFLEX MICROSCOPIC
Bilirubin Urine: NEGATIVE
Nitrite: NEGATIVE
Specific Gravity, Urine: 1.015 (ref 1.005–1.030)
Urobilinogen, UA: 0.2 mg/dL (ref 0.0–1.0)
pH: 5.5 (ref 5.0–8.0)

## 2010-12-15 LAB — MAGNESIUM: Magnesium: 2.3 mg/dL (ref 1.5–2.5)

## 2010-12-15 LAB — CK TOTAL AND CKMB (NOT AT ARMC)
CK, MB: 1.1 ng/mL (ref 0.3–4.0)
Relative Index: INVALID (ref 0.0–2.5)
Total CK: 32 U/L (ref 7–177)

## 2010-12-15 LAB — BASIC METABOLIC PANEL
CO2: 33 mEq/L — ABNORMAL HIGH (ref 19–32)
Calcium: 9 mg/dL (ref 8.4–10.5)
Creatinine, Ser: 1.3 mg/dL — ABNORMAL HIGH (ref 0.4–1.2)
GFR calc Af Amer: 47 mL/min — ABNORMAL LOW (ref >60)
GFR calc non Af Amer: 39 mL/min — ABNORMAL LOW (ref >60)
Sodium: 140 mEq/L (ref 135–145)

## 2010-12-15 LAB — DIFFERENTIAL
Eosinophils Absolute: 0.4 10*3/uL (ref 0.0–0.7)
Eosinophils Absolute: 0.5 10*3/uL (ref 0.0–0.7)
Eosinophils Relative: 5 % (ref 0–5)
Lymphocytes Relative: 20 % (ref 12–46)
Lymphocytes Relative: 28 % (ref 12–46)
Lymphs Abs: 1.8 10*3/uL (ref 0.7–4.0)
Lymphs Abs: 2.4 10*3/uL (ref 0.7–4.0)
Monocytes Relative: 11 % (ref 3–12)
Monocytes Relative: 8 % (ref 3–12)
Neutro Abs: 4.7 10*3/uL (ref 1.7–7.7)
Neutrophils Relative %: 55 % (ref 43–77)

## 2010-12-15 LAB — TROPONIN I: Troponin I: 0.02 ng/mL (ref 0.00–0.06)

## 2010-12-15 LAB — COMPREHENSIVE METABOLIC PANEL
ALT: 12 U/L (ref 0–35)
AST: 18 U/L (ref 0–37)
Calcium: 9.3 mg/dL (ref 8.4–10.5)
Creatinine, Ser: 1.29 mg/dL — ABNORMAL HIGH (ref 0.4–1.2)
GFR calc Af Amer: 47 mL/min — ABNORMAL LOW (ref >60)
GFR calc non Af Amer: 39 mL/min — ABNORMAL LOW (ref >60)
Sodium: 134 mEq/L — ABNORMAL LOW (ref 135–145)
Total Protein: 6.6 g/dL (ref 6.0–8.3)

## 2010-12-15 LAB — CARDIAC PANEL(CRET KIN+CKTOT+MB+TROPI)
CK, MB: 1.1 ng/mL (ref 0.3–4.0)
Relative Index: INVALID (ref 0.0–2.5)
Total CK: 28 U/L (ref 7–177)
Troponin I: 0.01 ng/mL (ref 0.00–0.06)

## 2010-12-15 LAB — BRAIN NATRIURETIC PEPTIDE: Pro B Natriuretic peptide (BNP): 241 pg/mL — ABNORMAL HIGH (ref 0.0–100.0)

## 2010-12-15 LAB — D-DIMER, QUANTITATIVE: D-Dimer, Quant: 0.98 ug/mL-FEU — ABNORMAL HIGH (ref 0.00–0.48)

## 2010-12-22 ENCOUNTER — Other Ambulatory Visit (HOSPITAL_COMMUNITY): Payer: Self-pay | Admitting: Internal Medicine

## 2010-12-22 DIAGNOSIS — G459 Transient cerebral ischemic attack, unspecified: Secondary | ICD-10-CM

## 2010-12-24 ENCOUNTER — Ambulatory Visit (HOSPITAL_COMMUNITY)
Admission: RE | Admit: 2010-12-24 | Discharge: 2010-12-24 | Disposition: A | Discharge status: Home / Self Care | Payer: PRIVATE HEALTH INSURANCE | Source: Ambulatory Visit | Attending: Internal Medicine | Admitting: Internal Medicine

## 2010-12-24 DIAGNOSIS — I10 Essential (primary) hypertension: Secondary | ICD-10-CM | POA: Insufficient documentation

## 2010-12-24 DIAGNOSIS — Z87891 Personal history of nicotine dependence: Secondary | ICD-10-CM | POA: Insufficient documentation

## 2010-12-24 DIAGNOSIS — G459 Transient cerebral ischemic attack, unspecified: Secondary | ICD-10-CM

## 2010-12-28 ENCOUNTER — Other Ambulatory Visit: Payer: Self-pay | Admitting: Cardiology

## 2010-12-30 NOTE — Telephone Encounter (Signed)
[place] °

## 2011-01-06 ENCOUNTER — Encounter: Payer: Self-pay | Admitting: Physician Assistant

## 2011-01-06 ENCOUNTER — Ambulatory Visit (INDEPENDENT_AMBULATORY_CARE_PROVIDER_SITE_OTHER): Payer: PRIVATE HEALTH INSURANCE | Admitting: Physician Assistant

## 2011-01-06 VITALS — BP 104/71 | HR 82 | Ht 64.0 in | Wt 135.0 lb

## 2011-01-06 DIAGNOSIS — I2510 Atherosclerotic heart disease of native coronary artery without angina pectoris: Secondary | ICD-10-CM

## 2011-01-06 DIAGNOSIS — I8290 Acute embolism and thrombosis of unspecified vein: Secondary | ICD-10-CM

## 2011-01-06 DIAGNOSIS — I749 Embolism and thrombosis of unspecified artery: Secondary | ICD-10-CM

## 2011-01-06 DIAGNOSIS — I4891 Unspecified atrial fibrillation: Secondary | ICD-10-CM

## 2011-01-06 DIAGNOSIS — I10 Essential (primary) hypertension: Secondary | ICD-10-CM

## 2011-01-06 DIAGNOSIS — G459 Transient cerebral ischemic attack, unspecified: Secondary | ICD-10-CM

## 2011-01-06 MED ORDER — WARFARIN SODIUM 5 MG PO TABS: 5.0000 mg | ORAL_TABLET | Freq: Every day | ORAL | Status: DC

## 2011-01-06 NOTE — Patient Instructions (Signed)
**Note De-Identified  Obfuscation** Your physician recommends that you schedule a follow-up appointment in: on Monday with [facility] and in 2 months Your physician has requested that you have an echocardiogram. Echocardiography is a painless test that uses sound waves to create images of your heart. It provides your doctor with information about the size and shape of your heart and how well your heart's chambers and valves are working. This procedure takes approximately one hour. There are no restrictions for this procedure. Your physician has recommended you make the following change in your medication: Stop taking Plavix and start taking Coumadin (1/2  of a 5mg  tablet daily) PLEASE TAKE AT 4:00pm everyday.

## 2011-01-06 NOTE — Assessment & Plan Note (Signed)
Stable without chest pain 

## 2011-01-06 NOTE — Assessment & Plan Note (Signed)
Blood pressure stable ? ?

## 2011-01-06 NOTE — Progress Notes (Signed)
HPI This is an 77 year old white female patient who has a history of chronic atrial fibrillation for the past 3 years. She has not taken Coumadin in the past because of easy bruising and worried about bleeding. She recently had 2 TIAs in which she had garbled speech and she loss the use of her arms and legs. These episodes lasted less than a minute. She was started on Plavix, and her aspirin was stopped by Dr. Sherwood Gambler. She was referred here for further cardiac evaluation.  The patient has Alzheimer's but she is accompanied by her daughter and caregiver who stays with her. The patient can't recall the events of the TIA. I discussed the patient with Dr. Dietrich Pates who recommends Coumadin. I had a long discussion about the risks and benefits of Coumadin with her daughter who has agreed to give it a try. She states that the patient bumps into things and bruises easily and sometimes tears her skin. She is worried about bleeding in this instance. The patient does not fall and has 24-hour care. She has had no GI bleeds.  Allergies  Allergen Reactions  . Sulfa Antibiotics   . Sulfonamide Derivatives     Current Outpatient Prescriptions on File Prior to Visit  Medication Sig Dispense Refill  . furosemide (LASIX) 40 MG tablet TAKE ONE TABLET BY MOUTH DAILY.  30 tablet  3    Past Medical History  Diagnosis Date  . ASCVD (arteriosclerotic cardiovascular disease)     acute MI in 1993;40%left main,80%LAD, 100%RCA-PTCA; EF of 40%;stress nuclear in2007  . CHF (congestive heart failure)     normal EF;mixed inferolateral ischemic and infarction;chf with normal EF in 2011  . Hypertension   . Hyperlipidemia   . CVA (cerebral infarction)     cva by ct in 2007,but not noted in 2009;duplex in 12/2006 plaque without stenosis  . Tobacco abuse   . Claudication   . Dementia     mild  . Kyphosis     severe  . Decreased hearing        History   Social History  . Marital Status: Divorced    Spouse Name: N/A      Number of Children: 2  . Years of Education: N/A   Occupational History  . retired    Social History Main Topics  . Smoking status: Former Smoker    Quit date: 01/05/1994  . Smokeless tobacco: Never Used  . Alcohol Use: No  . Drug Use: No  . Sexually Active:    Other Topics Concern  . Not on file   Social History Narrative  . No narrative on file    ROS: See HPI Eyes: Negative Ears: Positive for hearing loss, no tinnitus Cardiovascular: Negative for chest pain, palpitations dyspnea, dyspnea on exertion, near-syncope, orthopnea, paroxysmal nocturnal dyspnia and syncope,edema, claudication, cyanosis,.  Respiratory:   Negative for cough, hemoptysis, shortness of breath, sleep disturbances due to breathing, sputum production and wheezing.   Endocrine: Negative for cold intolerance and heat intolerance.  Hematologic/Lymphatic: Negative for adenopathy and bleeding problem. Does  bruise/bleed easily.  Musculoskeletal: Negative.   Gastrointestinal: Negative for nausea, vomiting, reflux, abdominal pain, diarrhea, constipation.   Genitourinary: Negative for bladder incontinence, dysuria, flank pain, frequency, hematuria, hesitancy, nocturia and urgency.  Neurological: Negative.  Allergic/Immunologic: Negative for environmental allergies.   PHYSICAL EXAM Elderly, in no acute distress. Neck:right carotid and subclavian bruit, No JVD, HJR, or thyroid enlargement Lungs: No tachypnea, clear without wheezing, rales, or rhonchi Cardiovascular:Irregular rate and rhythm,  PMI not displaced, 2/6 systolic murmur at the right sternal border and left sternal border, no gallops, bruit, thrill, or heave. Abdomen: BS normal. Soft without organomegaly, masses, lesions or tenderness. Extremities: without cyanosis, clubbing or edema. Good distal pulses bilateral SKin: Warm, no lesions or rashes  Musculoskeletal: No deformities Neuro: no focal signs  BP 104/71  Pulse 82  Ht 5\' 4"  (1.626 m)  Wt  135 lb (61.236 kg)  BMI 23.17 kg/m2  SpO2 92%  ACZ:YSAYTK fibrillation with right bundle branch block nonspecific T wave changes  ASSESSMENT AND PLAN:

## 2011-01-10 ENCOUNTER — Ambulatory Visit (HOSPITAL_COMMUNITY)
Admission: RE | Admit: 2011-01-10 | Discharge: 2011-01-10 | Disposition: A | Discharge status: Home / Self Care | Payer: PRIVATE HEALTH INSURANCE | Source: Ambulatory Visit | Attending: Cardiology | Admitting: Cardiology

## 2011-01-10 ENCOUNTER — Ambulatory Visit (INDEPENDENT_AMBULATORY_CARE_PROVIDER_SITE_OTHER): Payer: PRIVATE HEALTH INSURANCE | Admitting: *Deleted

## 2011-01-10 ENCOUNTER — Encounter: Payer: PRIVATE HEALTH INSURANCE | Admitting: *Deleted

## 2011-01-10 DIAGNOSIS — I059 Rheumatic mitral valve disease, unspecified: Secondary | ICD-10-CM

## 2011-01-10 DIAGNOSIS — Z7901 Long term (current) use of anticoagulants: Secondary | ICD-10-CM

## 2011-01-10 DIAGNOSIS — I4891 Unspecified atrial fibrillation: Secondary | ICD-10-CM

## 2011-01-10 DIAGNOSIS — D696 Thrombocytopenia, unspecified: Secondary | ICD-10-CM | POA: Insufficient documentation

## 2011-01-10 LAB — POCT INR: INR: 2.2

## 2011-01-13 ENCOUNTER — Encounter: Payer: Self-pay | Admitting: Physician Assistant

## 2011-01-17 ENCOUNTER — Ambulatory Visit (INDEPENDENT_AMBULATORY_CARE_PROVIDER_SITE_OTHER): Payer: PRIVATE HEALTH INSURANCE | Admitting: *Deleted

## 2011-01-17 DIAGNOSIS — I4891 Unspecified atrial fibrillation: Secondary | ICD-10-CM

## 2011-01-17 DIAGNOSIS — Z7901 Long term (current) use of anticoagulants: Secondary | ICD-10-CM

## 2011-01-25 ENCOUNTER — Encounter: Payer: Self-pay | Admitting: Cardiology

## 2011-01-25 DIAGNOSIS — I679 Cerebrovascular disease, unspecified: Secondary | ICD-10-CM | POA: Insufficient documentation

## 2011-01-27 ENCOUNTER — Ambulatory Visit (INDEPENDENT_AMBULATORY_CARE_PROVIDER_SITE_OTHER): Payer: PRIVATE HEALTH INSURANCE | Admitting: *Deleted

## 2011-01-27 DIAGNOSIS — Z7901 Long term (current) use of anticoagulants: Secondary | ICD-10-CM

## 2011-01-27 DIAGNOSIS — I4891 Unspecified atrial fibrillation: Secondary | ICD-10-CM

## 2011-01-27 LAB — POCT INR: INR: 1.7

## 2011-02-10 ENCOUNTER — Ambulatory Visit (INDEPENDENT_AMBULATORY_CARE_PROVIDER_SITE_OTHER): Payer: PRIVATE HEALTH INSURANCE | Admitting: *Deleted

## 2011-02-10 DIAGNOSIS — I4891 Unspecified atrial fibrillation: Secondary | ICD-10-CM

## 2011-02-10 DIAGNOSIS — Z7901 Long term (current) use of anticoagulants: Secondary | ICD-10-CM

## 2011-02-10 LAB — POCT INR: INR: 1.8

## 2011-02-15 NOTE — Letter (Signed)
Feb 05, 2008    Corrie Mckusick, M.D.  [address] [place]. [place], [place] [address]   RE:  GEORGANNA, MAXSON  MRN:  [id number]  /  DOB:  January 26, 1921   Dear Vonna Kotyk:   Ms. Kathman returns to the office for continued assessment and treatment of  coronary disease and cardiovascular risk factors, now 16 years following  intervention for acute inferior myocardial infarction.  She has not had  any trouble whatsoever with respect to coronary disease since that  event.  Cardiovascular risk factors have been well controlled.  She has  cerebrovascular disease, but vascular studies in March of 2008 showed  obstructive disease except for a possible left subclavian occlusion.  Since this was not symptomatic, no further investigation was performed.  Lipid profile was excellent when checked a few months ago.   CURRENT MEDICATIONS:  1. Aspirin 81 mg daily.  2. Pravastatin 40 mg daily.  3. Vitamin DIAGNOSIS.  4. Lisinopril, dose uncertain, daily.  5. KCl 20 mEq daily.  6. Fish oil 2400 mg daily.   PHYSICAL EXAMINATION:  GENERAL:  A pleasant woman who is hard of hearing  but in no acute distress.  VITAL SIGNS:  The weight is 145, 3 pounds more than last year.  Blood  pressure 115/70, heart rate 60 and regular, respirations 14.  NECK:  No jugular venous distention; bilateral carotid bruits, more  prominent on the right.  THORAX:  Marked kyphosis; clear lung fields except for some coarse  breath sounds at the left base.  CARDIAC:  Normal first and second heart sounds; modest basilar systolic  ejection murmur.  ABDOMEN:  Soft and nontender; no bruits; no masses; no organomegaly.  EXTREMITIES:  Brownish discoloration of both shins; varicosities; 1/2+  ankle edema; distal pulses intact.   IMPRESSION:  Ms. Zolman is doing beautifully with no significant medical  events over the past 14 months.  She will continue her current  medications and return to see me in 1 year.    Sincerely,      Gerrit Friends.  Dietrich Pates, MD, [facility]  Electronically Signed    RMR/MedQ  DD: 02/05/2008  DT: 02/05/2008  Job #: [id number]

## 2011-02-15 NOTE — Letter (Signed)
Feb 20, 2009    Corrie Mckusick, MD  [phone] [place], [place] [address]   RE:  Angela, [name]  MRN:  [id number]  /  DOB:  04-Mar-1921   Dear Jonny Ruiz:   Angela [name] returns to the office for an annual visit due to a remote  history of coronary artery disease, hypertension and hyperlipidemia.  As  you know, she has a degree of dementia and cannot provide adequate  history.  She reports no symptoms, no medical contacts and notes that  she is not currently taking any medications.  We are attempting to  verify that information with her son.  She lives at home with an aide  and does some housework without difficulty.   PHYSICAL EXAMINATION:  GENERAL:  A pleasant [name] in no acute distress.  VITAL SIGNS:  The weight is 137, 8 pounds less than last year.  Blood  pressure 125/75, heart rate 75 and regular, respirations 14 and  unlabored.  NECK:  No jugular venous distention; normal carotid upstrokes with  bilateral bruits.  LUNGS:  Moderate kyphosis; clear lung fields.  CARDIAC:  Normal first and second heart sounds; grade 2/6 basilar  systolic ejection murmur.  ABDOMEN:  Soft and nontender; no bruits; no organomegaly.  EXTREMITIES:  Trace edema; 1+ distal pulses.  NEUROLOGIC:  Symmetric strength and tone; oriented to x2 - knows it is  Friday, but does not know the month or date.   EKG:  Normal sinus rhythm; right bundle-branch block; no change when  compared to previous tracing of May 03, 2001.   IMPRESSION:  Angela [name] is doing well clinically.  She requires at a  minimum pravastatin and low-dose aspirin to continue to minimize the  likelihood of her recurrent coronary or cerebrovascular event.  We will  discuss Angela with her son.  Basic laboratory studies including a lipid  profile will be obtained in 1 month.  I will see Angela [name] again  in 1 year.    Sincerely,      Gerrit Friends. Dietrich Pates, MD, [facility]  Electronically Signed    RMR/MedQ  DD: 02/20/2009  DT:  02/21/2009  Job #: [id number]

## 2011-02-18 ENCOUNTER — Other Ambulatory Visit: Payer: Self-pay | Admitting: Cardiology

## 2011-02-18 LAB — BASIC METABOLIC PANEL
Calcium: 9.1 mg/dL (ref 8.4–10.5)
Creat: 1.35 mg/dL — ABNORMAL HIGH (ref 0.40–1.20)

## 2011-02-18 NOTE — Discharge Summary (Signed)
NAMECHANEY, INGRAM NO.:  [id number]   MEDICAL RECORD NO.:  [id number]          PATIENT TYPE:  INP   LOCATION:  3741                         FACILITY:  [facility]   PHYSICIAN:  Nicki Guadalajara, M.D.     DATE OF BIRTH:  1921/03/03   DATE OF ADMISSION:  03/12/2006  DATE OF DISCHARGE:  03/18/2006                                 DISCHARGE SUMMARY   DISCHARGE DIAGNOSES:  1.  Status post hypovolemic shock secondary to poor p.o. intake.  2.  Hypotension.  Likely secondary to volume depletion with poor p.o. intake      and nausea and vomiting.  3.  Transient atrial fibrillation - resolved.  Probably due to hypotension.  4.  Status post recent urinary tract infection evaluation of the patient      prior this admission.  5.  Pneumonia by chest radiographs.  6.  Leukocytes during this admission.  7.  Known coronary artery disease status post myocardial infarction in 1993      requiring PCI.  8.  Renal insufficiency.  Probably secondary to volume depletion.  9.  Dyslipidemia.  10. Alzheimer dementia with alternating mental status.   HOSPITAL PROCEDURES:  A 2D echo was performed on March 17, 2006 revealed  normal left ventricular systolic function with a mild focal basil septal  hypertrophy, nodular calcification ,especially NCC but no significant  stenosis.  Aortic valvular regurgitation, mild mitral aortic regurgitation,  mitral valve area by pressure half time was 2.58 sq cm.  Mild to moderate  dilatation of the left atrium.   HISTORY OF PRESENT ILLNESS/HOSPITAL COURSE:  This is an 77 year old  Caucasian female who was admitted to [facility] secondary to  hypovolemic shock and hypotension.  She was initially seen at [facility] with complaints of weakness, nausea, vomiting and altered mental  status and the patient was found to be hypotensive, dehydrated and immediate  fluid resuscitation was started at [facility] and then she was  transferred to  [facility].  Here in [facility], she was seen by Dr.  Waldon Reining and then pulmonology and critical care consult was ordered. They saw  the patient and felt like her hypovolemic shock could be caused by poor p.o.  intake and it was aggravated by nausea and vomiting, and was likely  secondary to medication side effects.  She was treated with Septra for UTI,  so this medicine was discontinued and she was continued on IV fluids and IV  antibiotic Zosyn.   Chest x-ray revealed patchy bilateral pneumonia.   She was continued on fluid resuscitation and because of low blood pressure  was kept on pressors, but eventually we were able to wean her off of  Levophed and her blood pressure significantly improved and on the day of  discharge it was 157/79.   Because of mental status changes a head CT was performed on March 12, 2006,  showed no acute or reversible process.  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  The chest x-ray the next  day  after admission revealed stable cardiomegaly with mild patchy bibasilar  atelectasis/infiltrates and no significant change in aeration.  Repeat chest  x-ray showed increasing capacity at the basis most likely consistent with  atelectasis and possible small effusion and questionable mild congestion and  the patient was given IV Lasix with clinical improvement.  We checked her  BNP and it was elevated.  It was 310.  The second reading was 297 and now  the BNP today prior to discharge was 716, so we gave the patient a dose of  Lasix to reduce excess fluid volume.  She had 2D echo on March 18, 2006 the  result which was mentioned above.  Essentially she did not have any  significant systolic dysfunction.   Dr. Allyson Sabal assessed patient on March 18, 2006 and her lungs sounded much  better.  They did not have any wheezing and has improved air exchange.  Her  potassium was low and it was repleted and we ordered another STAT BNP  prior  to patient's discharge home.   DISCHARGE MEDICATIONS:  1.  Aspirin 81 mg q.d.  2.  Lasix 40 mg alternating with 20 mg q.d.  3.  Potassium 40 mEq q.d.  4.  Promethazine 25 mg q.8-12 h.  5.  Altace 10 mg q.d.  6.  Allegra 180 mg q.d.   DISCHARGE DIET:  Low salt, low cholesterol diet.   DISCHARGE FOLLOWUP:  She will be seen by Dr. Tresa Endo and our office will call  to set up this appointment.   Patient discharged pending improved BNP and we also ordered case manager to  evaluate the patient to provide patient with assistance at home during the  day hours.      Raymon Mutton, P.A.    ______________________________  Nicki Guadalajara, M.D.    MK/MEDQ  D:  03/18/2006  T:  03/18/2006  Job:  [id number]

## 2011-02-18 NOTE — Procedures (Signed)
NAMEKAMORIE, ALDOUS NO.:  [id number]   MEDICAL RECORD NO.:  [id number]         PATIENT TYPE:  REC   LOCATION:                                FACILITY:  [facility]   PHYSICIAN:  Vida Roller, M.D.   DATE OF BIRTH:  1921/08/28   DATE OF PROCEDURE:  DATE OF DISCHARGE:                                    STRESS TEST   HISTORY:  Ms. Geers is an 77 year old female with known coronary artery  disease status post percutaneous angioplasty to her RCA in 1993. At that  time she had residual 40% left main, 80% LAD and an EF of 40%.  No  evaluations at that time with stress test or repeat catheterization.   BASELINE DATA:  Electrocardiogram reveals a sinus rhythm at 71 beats per  minute with right bundle branch block pattern.   Blood pressure is 112/60.   Thirty nine milligrams of adenosine is infused over 4-minute protocol.  Myoview was injected at 3 minutes. The patient reported chest discomfort and  a funny feeling all over that resolved in recovery. EKG revealed transient  AV block that resolved quickly. Right bundle branch block was present  throughout.   Final images and results are pending MD review.      Jae Dire, P.A. [facility]      Vida Roller, M.D.  Electronically Signed    AB/MEDQ  D:  11/08/2005  T:  11/08/2005  Job:  [id number]

## 2011-02-18 NOTE — H&P (Signed)
NAMECARAMIA, BOUTIN NO.:  [id number]   MEDICAL RECORD NO.:  [id number]          PATIENT TYPE:  INP   LOCATION:  2915                         FACILITY:  [facility]   PHYSICIAN:  Ulyses Amor, MD DATE OF BIRTH:  Jul 14, 1921   DATE OF ADMISSION:  03/12/2006  DATE OF DISCHARGE:                                HISTORY & PHYSICAL   Angela [name] is an 77 year old white woman who was transferred from [facility] to [facility] for further management of pneumonia,  dehydration and possible sepsis.  The patient presented to [facility] today because of altered mental status.  She also experienced  several episodes of nausea and vomiting.  She had been treated beginning  yesterday with an oral antibiotic for a urinary tract infection.  She had  apparently not been eating or drinking much.  Her family noted her to have  an abnormal mental status.  Upon presentation to the emergency department at  [facility] she was noted to be hypotensive.  Treatment was  initiated with fluids.  A CBC demonstrated an elevated white count and a  chest x-ray demonstrated pneumonia.  In addition, Levophed was started.  A  brief transient period of atrial fibrillation occurred but resolved  spontaneously.  She was then transferred to [facility] for further  management.   The patient has a history of myocardial infarction in 1993.  She was  subsequently treated with a percutaneous coronary intervention.  Her cardiac  course has been uncomplicated since then.  She has experienced no angina, no  congestive heart failure and no arrhythmia since that time.   Other medical problems include dyslipidemia and dementia.   MEDICATIONS:  Furosemide, Pravastatin and fexofenadine.   ALLERGIES:  None.   SURGERY:  Right ankle surgery in the remote past.   SOCIAL HISTORY:  The patient lives with her son.  She neither smokes nor  drinks alcohol.  She is  widowed.   FAMILY HISTORY:  Unknown.   REVIEW OF SYSTEMS:  As obtained from her daughter and son.  Reveals no new  problems related to her head, eyes, ears, nose, mouth, throat, lungs,  gastrointestinal system, genitourinary system or extremities.  There is no  history of neurologic or psychiatric disorder other than as described above.  There is no history of fever, chills or weight loss.  There is no history of  cough, sputum production or chest congestion.   PHYSICAL EXAMINATION:  Blood pressure 84/31, pulse 84 and regular,  respirations 21, temperature normal.  The patient was an elderly white woman  in no discomfort.  She was alert, oriented and responsive.  Head, eyes, nose  and mouth were normal.  The neck was without thyromegaly or adenopathy.  Carotid pulses were palpable bilaterally without bruits.  Cardiac  examination revealed a normal S1 and S2.  There was no S3, S4, murmur, rub  or click.  Cardiac rhythm was regular.  No chest wall tenderness was noted.  Lungs were clear.  The abdomen was soft and nontender.  There was  no mass,  hepatosplenomegaly, bruit, distention, rebound, guarding or rigidity.  Bowel  sounds were normal.  Breasts, pelvic and rectal examinations were not  performed as they were not pertinent to the reason for acute care  hospitalization.  The extremities were without edema, deviation or  deformity.  Radial and dorsalis pedis pulses were palpable bilaterally.  Brief screening neurologic survey was unremarkable.   The electrocardiogram, as obtained at [facility], revealed atrial  fibrillation with a ventricular rate of 102 beats per minute, right bundle  branch block was present.  There were no changes specific for ischemia or  infarction.  The cardiac rhythm has subsequently reverted to normal sinus  rhythm.  The chest radiograph, according to the radiologist, demonstrated a  patchy bilateral pneumonia.  The head CT, according to the  radiologist,  demonstrated a old right parietal infarct.  The white count was 18.2 with  91% neutrophils and 4% leukocytes.  Hemoglobin and hematocrit were 13.6 and  41.4 respectively.  Potassium was 4.2, BUN 27 and creatinine 2.0.  Initial  CK was 65 with a CK-MB of 1.4.  Urinalysis revealed a specific gravity of  greater than 1.03 with negative glucose, hemoglobin, bilirubin, ketones,  protein, urobilinogen, nitrate and leukocyte esterase.  Fibrin derivatives  were 1.02.  The remaining studies were pending at the time of this  dictation.   IMPRESSION:  1.  Pneumonia by chest radiograph and leukocytosis.  2.  Hypotension probably due to volume depletion:  Rule out sepsis.  3.  Urinary tract infection.  4.  Transient atrial fibrillation resolved:  Probably due to hypotension.  5.  Coronary artery disease:  Status post myocardial infarction 1993      resulting in percutaneous coronary intervention.  Her cardiac history      has been unremarkable since then.  There is no history of congestive      heart failure, arrhythmia or angina.  6.  Dyslipidemia.  7.  Renal insufficiency:  Probably due, at least in part, to volume      depletion.  8.  Dementia.   PLAN:  1.  Coronary care unit.  2.  Serial cardiac enzymes.  3.  Fluid and pressor support.  4.  Fluid resuscitation can be aggressive as there is no history of      congestive heart failure.  5.  Further management per critical care.  I have spoken with Dr. Craige Cotta.      Ulyses Amor, MD  Electronically Signed     MSC/MEDQ  D:  03/12/2006  T:  03/13/2006  Job:  [id number]   cc:   Ulyses Amor, MD  Fax: [phone]   Nicki Guadalajara, M.D.  Fax: [phone]

## 2011-02-18 NOTE — Letter (Signed)
December 04, 2006    Angela [name], M.D.  [address] [place]. Angela [name], [place] [address]   RE:  Angela [name], HAROS  MRN:  [id number]  /  DOB:  13-Mar-1921   Dear Angela [name]:   Angela [name] returns to the office for continued assessment and treatment of  coronary disease and cardiovascular risk factors, now 15 years following  percutaneous intervention for acute inferior myocardial infarction with  total occlusion of the right coronary artery.  She has done extremely  well since that procedure.  She was seen a few weeks ago in the  emergency department for chest pain.  Basic studies including a D-dimer  level, cardiac markers, EKG, and chest x-ray were unremarkable.  She has  felt well since.   The patient has known cerebrovascular disease with atherosclerotic  changes in the carotids and CT evidence of a prior CVA.  Her last  carotid ultrasound study was in 2002.   CURRENT MEDICATIONS:  1. Aspirin 81 mg daily.  2. Pravastatin either 20 or 40 mg daily.  3. Ramipril 10 mg daily.  4. Fexofenadine 180 mg daily.  5. Furosemide 20 mg daily was recently discontinued due to relative      hypotension.   PHYSICAL EXAMINATION:  GENERAL:  Pleasant, somewhat vague older [name] in  no acute distress.  VITAL SIGNS: Weight 142, 11 pounds less than last year.  Blood pressure  125/75, heart rate 70 and regular, respirations 16.  NECK:  No jugular venous distention; prominent right carotid bruit.  THORAX:  Marked kyphosis; clear lung fields.  CARDIAC: Normal first and second heart sounds; grade 2/6 basilar  systolic ejection murmur.  ABDOMEN: Soft and nontender; no masses; no bruits  EXTREMITIES: 1-2+ distal pulses; trace edema.   The patient brings in a list of blood pressures.  Typical systolics  range from 100 to 120.  In mid February, systolic blood pressures were  between 83 and 95, prompting discontinuation of furosemide.  Hospital  records and reports of previous radiographic studies were also  reviewed.   IMPRESSION:  Angela [name] is doing generally well.  We will reassess the  lipid profile.  She will be asked to return with her medications so we  can determine exactly what she is taking.  Influenza vaccination is up  to date.  We are not certain regarding pneumococcal vaccine.  Due to her  very impressive kyphosis, you may wish to assess Angela [name] for  osteoporosis if Angela has not previously been done.  I will plan to  reevaluate Angela [name] in one year.    Sincerely,      Gerrit Friends. Dietrich Pates, MD, [facility]  Electronically Signed    RMR/MedQ  DD: 12/04/2006  DT: 12/04/2006  Job #: [id number]

## 2011-02-21 ENCOUNTER — Encounter: Payer: Self-pay | Admitting: *Deleted

## 2011-02-24 ENCOUNTER — Ambulatory Visit (INDEPENDENT_AMBULATORY_CARE_PROVIDER_SITE_OTHER): Payer: PRIVATE HEALTH INSURANCE | Admitting: *Deleted

## 2011-02-24 DIAGNOSIS — I4891 Unspecified atrial fibrillation: Secondary | ICD-10-CM

## 2011-02-24 DIAGNOSIS — Z7901 Long term (current) use of anticoagulants: Secondary | ICD-10-CM

## 2011-03-09 ENCOUNTER — Other Ambulatory Visit: Payer: Self-pay | Admitting: Cardiology

## 2011-03-11 ENCOUNTER — Ambulatory Visit (INDEPENDENT_AMBULATORY_CARE_PROVIDER_SITE_OTHER): Payer: PRIVATE HEALTH INSURANCE | Admitting: *Deleted

## 2011-03-11 ENCOUNTER — Encounter: Payer: Self-pay | Admitting: Cardiology

## 2011-03-11 ENCOUNTER — Ambulatory Visit (INDEPENDENT_AMBULATORY_CARE_PROVIDER_SITE_OTHER): Payer: PRIVATE HEALTH INSURANCE | Admitting: Cardiology

## 2011-03-11 ENCOUNTER — Encounter: Payer: Self-pay | Admitting: *Deleted

## 2011-03-11 DIAGNOSIS — I509 Heart failure, unspecified: Secondary | ICD-10-CM

## 2011-03-11 DIAGNOSIS — I2510 Atherosclerotic heart disease of native coronary artery without angina pectoris: Secondary | ICD-10-CM

## 2011-03-11 DIAGNOSIS — Z7901 Long term (current) use of anticoagulants: Secondary | ICD-10-CM

## 2011-03-11 DIAGNOSIS — I679 Cerebrovascular disease, unspecified: Secondary | ICD-10-CM

## 2011-03-11 DIAGNOSIS — I739 Peripheral vascular disease, unspecified: Secondary | ICD-10-CM

## 2011-03-11 DIAGNOSIS — I4891 Unspecified atrial fibrillation: Secondary | ICD-10-CM

## 2011-03-11 DIAGNOSIS — F17200 Nicotine dependence, unspecified, uncomplicated: Secondary | ICD-10-CM

## 2011-03-11 DIAGNOSIS — E785 Hyperlipidemia, unspecified: Secondary | ICD-10-CM

## 2011-03-11 DIAGNOSIS — Z720 Tobacco use: Secondary | ICD-10-CM

## 2011-03-11 NOTE — Assessment & Plan Note (Signed)
Patient has done well since anticoagulation was started.  We will monitor for occult GI blood loss and adjust warfarin dosing as necessary.

## 2011-03-11 NOTE — Assessment & Plan Note (Signed)
Patient had no definite obstructive disease on a recent carotid ultrasound study.  Prior neurologic symptoms apparently reflected cardioembolism.  No further treatment or testing is required at present.

## 2011-03-11 NOTE — Patient Instructions (Addendum)
Your physician recommends that you schedule a follow-up appointment in: April 11, 2011 Continue coumadin to 2.5mg  daily  IF to thin next visit will need to change tablet to 2.5mg 

## 2011-03-11 NOTE — Assessment & Plan Note (Addendum)
Patient is asymptomatic with good control of heart rate.  We will continue to pursue a rate control strategy with protective anticoagulation.

## 2011-03-11 NOTE — Assessment & Plan Note (Signed)
CHF appears compensated on examination today.  BNP was only modestly elevated when checked at her last visit.  Current therapy appears appropriate and effective.

## 2011-03-11 NOTE — Assessment & Plan Note (Signed)
No recent lipid profile available; one will be obtained along with other needed laboratory included a CBC, chemistry profile and TSH level.

## 2011-03-11 NOTE — Progress Notes (Signed)
HPI : Angela [name] is seen in the office as scheduled for continuing assessment and treatment of atrial fibrillation requiring anticoagulation.  This nice woman has done well since her last visit 2 months ago.  She has some unsteadiness of gait, but has not fallen.  She has not had recurrent neurologic symptoms.  She denies chest discomfort or dyspnea, but activity is limited.  She has had no problems with Coumadin since starting it 2 months ago.  Patient does not believe that she has ever undergone colonoscopy, and that there is no record of such a procedure in her computerized data.  A single study might be reasonable to exclude any developing neoplasms.  Patient will discuss this with her PCP at her next visit.  Current Outpatient Prescriptions on File Prior to Visit  Medication Sig Dispense Refill  . albuterol-ipratropium (COMBIVENT) 18-103 MCG/ACT inhaler Inhale 2 puffs into the lungs every 6 (six) hours as needed.        . Ascorbic Acid (VITAMIN C) 500 MG tablet Take 500 mg by mouth daily.        . cetirizine (ZYRTEC) 10 MG tablet Take 10 mg by mouth daily.        . Cholecalciferol (VITAMIN D) 400 UNITS capsule Take 400 Units by mouth daily.        . clobetasol (OLUX) 0.05 % topical foam Apply 1 application topically 2 (two) times daily.        . Dietary Management Product (AXONA) packet Take 40 g by mouth daily.        Marland Kitchen donepezil (ARICEPT) 5 MG tablet Take 5 mg by mouth at bedtime as needed.        . furosemide (LASIX) 40 MG tablet TAKE ONE TABLET BY MOUTH DAILY.  30 tablet  3  . ibandronate (BONIVA) 150 MG tablet Take 150 mg by mouth every 30 (thirty) days. Take in the morning with a full glass of water, on an empty stomach, and do not take anything else by mouth or lie down for the next 30 min.       Marland Kitchen LORazepam (ATIVAN) 0.5 MG tablet Take 0.5 mg by mouth every 8 (eight) hours. 1/4 tab am 1/2 tab pm       . memantine (NAMENDA) 10 MG tablet Take 10 mg by mouth 2 (two) times daily.        .  Multiple Vitamins-Minerals (MULTIVITAMIN WITH MINERALS) tablet Take 1 tablet by mouth daily.        . Omega-3 Fatty Acids (FISH OIL) 1000 MG CAPS Take 1 capsule by mouth daily.        Marland Kitchen POLYETHYLENE GLYCOL 3350 PO Take 1 Units by mouth daily.        . potassium chloride SA (K-DUR,KLOR-CON) 20 MEQ tablet Take 20 mEq by mouth daily.        . pravastatin (PRAVACHOL) 40 MG tablet TAKE 1 TABLET BY MOUTH AT BEDTIME  30 tablet  11  . Salicylic Acid-Cleanser (SALEX) 6 % (LOTION) KIT Apply topically.        . warfarin (COUMADIN) 5 MG tablet Take 1 tablet (5 mg total) by mouth daily. Take as directed by [facility]  45 tablet  2     Allergies  Allergen Reactions  . Sulfa Antibiotics   . Sulfonamide Derivatives       Past medical history, social history, and family history reviewed and updated.  ROS: See history of present illness.  PHYSICAL EXAM: BP 108/72  Pulse 78  Ht 4\' 6"  (1.372 m)  Wt 133 lb (60.328 kg)  BMI 32.07 kg/m2  SpO2 92%  General-Well developed; no acute distress Body habitus-overweight HEENT:  Right ptosis; artificial right eye Neck-No JVD; no carotid bruits Lungs-clear lung fields; resonant to percussion; marked kyphosis Cardiovascular-normal PMI; normal S1 and S2; grade 2/6 basilar systolic ejection murmur; irregular rhythm with apical pulse of 72 Abdomen-normal bowel sounds; soft and non-tender without masses or organomegaly Musculoskeletal-No deformities, no cyanosis or clubbing Neurologic-Normal cranial nerves; symmetric strength and tone Skin-Warm, no significant lesions Extremities-distal pulses intact; 1/2+ edema  Laboratory: 07/2010-normal complete metabolic profile; normal CBC; BNP of 187; chest x-ray-mild cardiomegaly; marked kyphosis; osteopenia; slight pleural effusions; right mid lung granuloma.  ASSESSMENT AND PLAN:

## 2011-03-11 NOTE — Patient Instructions (Signed)
Your physician recommends that you schedule a follow-up appointment in: 1 year Your physician recommends that you return for lab work in: next week Stool cards- please follow directions in packet and return to our office as soon as complete

## 2011-03-11 NOTE — Assessment & Plan Note (Signed)
Patient has no symptoms to suggest progression of coronary disease.  She has done very well since presenting with moderately severe coronary disease 20 years ago.  Our approach will be continued optimal management of cardiovascular risk factors.

## 2011-03-12 ENCOUNTER — Other Ambulatory Visit: Payer: Self-pay | Admitting: Cardiology

## 2011-03-13 LAB — CBC WITH DIFFERENTIAL/PLATELET
Basophils Absolute: 0.1 10*3/uL (ref 0.0–0.1)
Basophils Relative: 1 % (ref 0–1)
Eosinophils Absolute: 0.3 10*3/uL (ref 0.0–0.7)
Eosinophils Relative: 4 % (ref 0–5)
HCT: 47.9 % — ABNORMAL HIGH (ref 36.0–46.0)
Hemoglobin: 14.7 g/dL (ref 12.0–15.0)
Lymphocytes Relative: 30 % (ref 12–46)
Lymphs Abs: 2.3 10*3/uL (ref 0.7–4.0)
MCH: 29.2 pg (ref 26.0–34.0)
MCHC: 30.7 g/dL (ref 30.0–36.0)
MCV: 95.2 fL (ref 78.0–100.0)
Monocytes Absolute: 0.7 10*3/uL (ref 0.1–1.0)
Monocytes Relative: 9 % (ref 3–12)
Neutro Abs: 4.2 10*3/uL (ref 1.7–7.7)
Neutrophils Relative %: 55 % (ref 43–77)
Platelets: 245 10*3/uL (ref 150–400)
RBC: 5.03 MIL/uL (ref 3.87–5.11)
RDW: 14.3 % (ref 11.5–15.5)
WBC: 7.6 10*3/uL (ref 4.0–10.5)

## 2011-03-13 LAB — COMPREHENSIVE METABOLIC PANEL
ALT: 10 U/L (ref 0–35)
AST: 17 U/L (ref 0–37)
Albumin: 4.2 g/dL (ref 3.5–5.2)
Alkaline Phosphatase: 116 U/L (ref 39–117)
BUN: 21 mg/dL (ref 6–23)
CO2: 33 mEq/L — ABNORMAL HIGH (ref 19–32)
Calcium: 9.3 mg/dL (ref 8.4–10.5)
Chloride: 101 mEq/L (ref 96–112)
Creat: 1.31 mg/dL — ABNORMAL HIGH (ref 0.50–1.10)
Glucose, Bld: 80 mg/dL (ref 70–99)
Potassium: 4.4 mEq/L (ref 3.5–5.3)
Sodium: 141 mEq/L (ref 135–145)
Total Bilirubin: 0.5 mg/dL (ref 0.3–1.2)
Total Protein: 6.2 g/dL (ref 6.0–8.3)

## 2011-03-13 LAB — LIPID PANEL
HDL: 39 mg/dL — ABNORMAL LOW (ref >39)
LDL Cholesterol: 78 mg/dL (ref 0–99)
Total CHOL/HDL Ratio: 3.8 Ratio

## 2011-03-13 LAB — MAGNESIUM: Magnesium: 2.4 mg/dL (ref 1.5–2.5)

## 2011-03-14 ENCOUNTER — Encounter: Payer: Self-pay | Admitting: Cardiology

## 2011-03-16 ENCOUNTER — Encounter: Payer: Self-pay | Admitting: *Deleted

## 2011-03-17 ENCOUNTER — Encounter: Payer: PRIVATE HEALTH INSURANCE | Admitting: *Deleted

## 2011-04-04 ENCOUNTER — Other Ambulatory Visit: Payer: Self-pay | Admitting: Cardiology

## 2011-04-11 ENCOUNTER — Ambulatory Visit (INDEPENDENT_AMBULATORY_CARE_PROVIDER_SITE_OTHER): Payer: PRIVATE HEALTH INSURANCE | Admitting: *Deleted

## 2011-04-11 DIAGNOSIS — I4891 Unspecified atrial fibrillation: Secondary | ICD-10-CM

## 2011-04-11 LAB — POCT INR: INR: 3.7

## 2011-04-25 ENCOUNTER — Ambulatory Visit (INDEPENDENT_AMBULATORY_CARE_PROVIDER_SITE_OTHER): Payer: PRIVATE HEALTH INSURANCE | Admitting: *Deleted

## 2011-04-25 DIAGNOSIS — Z7901 Long term (current) use of anticoagulants: Secondary | ICD-10-CM

## 2011-04-25 DIAGNOSIS — I4891 Unspecified atrial fibrillation: Secondary | ICD-10-CM

## 2011-04-25 LAB — POCT INR: INR: 3.3

## 2011-04-26 ENCOUNTER — Other Ambulatory Visit: Payer: Self-pay | Admitting: Cardiology

## 2011-05-16 ENCOUNTER — Ambulatory Visit (INDEPENDENT_AMBULATORY_CARE_PROVIDER_SITE_OTHER): Payer: PRIVATE HEALTH INSURANCE | Admitting: *Deleted

## 2011-05-16 DIAGNOSIS — Z7901 Long term (current) use of anticoagulants: Secondary | ICD-10-CM

## 2011-05-16 DIAGNOSIS — I4891 Unspecified atrial fibrillation: Secondary | ICD-10-CM

## 2011-05-16 LAB — POCT INR: INR: 4

## 2011-05-25 ENCOUNTER — Ambulatory Visit (INDEPENDENT_AMBULATORY_CARE_PROVIDER_SITE_OTHER): Payer: PRIVATE HEALTH INSURANCE | Admitting: Adult Health

## 2011-05-25 ENCOUNTER — Ambulatory Visit (INDEPENDENT_AMBULATORY_CARE_PROVIDER_SITE_OTHER): Payer: PRIVATE HEALTH INSURANCE | Admitting: *Deleted

## 2011-05-25 ENCOUNTER — Encounter: Payer: Self-pay | Admitting: Adult Health

## 2011-05-25 DIAGNOSIS — I4891 Unspecified atrial fibrillation: Secondary | ICD-10-CM

## 2011-05-25 DIAGNOSIS — I2510 Atherosclerotic heart disease of native coronary artery without angina pectoris: Secondary | ICD-10-CM

## 2011-05-25 DIAGNOSIS — Z7901 Long term (current) use of anticoagulants: Secondary | ICD-10-CM

## 2011-05-25 MED ORDER — NITROGLYCERIN 0.4 MG SL SUBL: 0.4000 mg | SUBLINGUAL_TABLET | SUBLINGUAL | Status: DC | PRN

## 2011-05-25 NOTE — Patient Instructions (Signed)
Your physician has recommended you make the following change in your medication: start taking Nitroglycerin for your chest pain, please follow directions on medication bottle  Your physician recommends that you schedule a follow-up appointment in: 9 months

## 2011-05-25 NOTE — Assessment & Plan Note (Signed)
She has been having an occasional incidence of chest discomfort which is relieved with NTG X 1.  EKG reviewed is unchanged from prior EKG. I will continue to treat her medically for now with Rx for NTG as her son used a family member's doses.  IF chest pain is persistent, my consider adding long acting nitrate to medication regimen.  She will follow with Dr. Dietrich Pates in 9 months.

## 2011-05-25 NOTE — Progress Notes (Signed)
HPI:  Angela [name] is a 77 y/o patient of Dr. Dietrich Pates with multiple medical problems. We are following her for Atrial fibrillation, coumadin clinic follow-up in our office, CVA, TIA. She is accompanied by her son who states she had two episodes of chest discomfort over the last two weeks. She was given SL NTG X 1 for this and pain resolved.  The pain occurred once with ambulation and once at rest. The patient has dementia and does not remember these events. She has also been diagnosed with TIA's in June.  Her son is very attentive and gives her the medications she requires and brings her to appointments.  She has not had any further complaints of CP for over a week.  Allergies  Allergen Reactions  . Sulfa Antibiotics   . Sulfonamide Derivatives     Current Outpatient Prescriptions  Medication Sig Dispense Refill  . albuterol-ipratropium (COMBIVENT) 18-103 MCG/ACT inhaler Inhale 2 puffs into the lungs every 6 (six) hours as needed.        Marland Kitchen alendronate (FOSAMAX) 70 MG tablet Take 70 mg by mouth every 7 (seven) days. Take with a full glass of water on an empty stomach.       . Ascorbic Acid (VITAMIN C) 500 MG tablet Take 500 mg by mouth daily.        . cetirizine (ZYRTEC) 10 MG tablet Take 10 mg by mouth daily.        . Cholecalciferol (VITAMIN D) 1000 UNITS capsule Take 1,000 Units by mouth daily.        . clobetasol (OLUX) 0.05 % topical foam Apply 1 application topically 2 (two) times daily.        . Dietary Management Product (AXONA) packet Take 40 g by mouth daily.        Marland Kitchen donepezil (ARICEPT) 5 MG tablet Take 5 mg by mouth at bedtime as needed.        . furosemide (LASIX) 40 MG tablet TAKE ONE TABLET BY MOUTH DAILY.  30 tablet  6  . LORazepam (ATIVAN) 0.5 MG tablet Take 0.5 mg by mouth every 8 (eight) hours. 1/4 tab am 1/2 tab pm       . memantine (NAMENDA) 10 MG tablet Take 10 mg by mouth daily.       . Multiple Vitamins-Minerals (MULTIVITAMIN WITH MINERALS) tablet Take 1 tablet by mouth  daily.        . Omega-3 Fatty Acids (FISH OIL) 1000 MG CAPS Take 1 capsule by mouth daily.        Marland Kitchen POLYETHYLENE GLYCOL 3350 PO Take 1 Units by mouth daily.        . potassium chloride SA (K-DUR,KLOR-CON) 20 MEQ tablet Take 20 mEq by mouth daily.        . pravastatin (PRAVACHOL) 40 MG tablet TAKE 1 TABLET BY MOUTH AT BEDTIME  30 tablet  11  . Salicylic Acid-Cleanser (SALEX) 6 % (LOTION) KIT Apply topically.        . warfarin (COUMADIN) 5 MG tablet Take 1 tablet (5 mg total) by mouth daily. Take as directed by Coumadin Clinic  45 tablet  2  . nitroGLYCERIN (NITROSTAT) 0.4 MG SL tablet Place 1 tablet (0.4 mg total) under the tongue every 5 (five) minutes as needed for chest pain.  25 tablet  3    Past Medical History  Diagnosis Date  . ASCVD (arteriosclerotic cardiovascular disease)     acute MI in 1993;40%left main,80%LAD, 100%RCA-PTCA; EF of 40%;stress nuclear in2007;  normal EF;  mixed inferolateral ischemia and infarction; CHF with normal EF in 2011  . CHF (congestive heart failure)     normal EF;mixed inferolateral ischemic and infarction;chf with normal EF in 2011  . Hypertension   . Hyperlipidemia   . Cerebrovascular disease     CVA by CT in 2007,but not noted in 2009;duplex in 12/2006 plaque without stenosis; Emergency Room evaluation in 12/2010 for transient aphasia and paresis-clopidogrel initially added to ASA Rx, but subsequently warfarin was substituted for these 2 agents  . Tobacco abuse   . Claudication   . Dementia     mild  . Kyphosis     severe  . Decreased hearing   . Atrial fibrillation   . Chronic anticoagulation     Past Surgical History  Procedure Date  . Abdominal hysterectomy   . Appendectomy   . Tonsillectomy   . Enucleation     Right-resulted from infection    ZOX:WRUEAV of systems complete and found to be negative unless listed above PHYSICAL EXAM BP 137/71  Pulse 69  Resp 18  Ht 5\' 3"  (1.6 m)  Wt 136 lb 6.4 oz (61.871 kg)  BMI 24.16 kg/m2  SpO2  92% General: Well developed, well nourished, in no acute distress Head: Eyes PERRLA, No xanthomas.   Normal cephalic and atramatic  Lungs: Clear bilaterally to auscultation and percussion. Heart: Irregular rhythm, with 1/6 systolic murmur. .  Pulses are 2+ & equal.            No carotid bruit. No JVD.  No abdominal bruits. No femoral bruits. Abdomen: Bowel sounds are positive, abdomen soft and non-tender without masses or                  Hernia's noted. Msk:  Back significant kyphosis , slow, but steady gait. Normal strength and tone for age. Extremities: No clubbing, cyanosis or edema.  DP +1 Neuro: Alert and oriented X 3. Very HOH. Psych:  Good affect, responds appropriately  EKG: Atrial fibrillation with PVC's.  T-wave abnormality inferiorly, unchanged since EKG in April of 2012. Rate of 79 bpm.  ASSESSMENT AND PLAN

## 2011-05-25 NOTE — Assessment & Plan Note (Signed)
Rate is well controlled on current medications. She will continue follow-up with coumadin clinic for dosing.

## 2011-06-09 ENCOUNTER — Ambulatory Visit (INDEPENDENT_AMBULATORY_CARE_PROVIDER_SITE_OTHER): Payer: PRIVATE HEALTH INSURANCE | Admitting: *Deleted

## 2011-06-09 DIAGNOSIS — Z7901 Long term (current) use of anticoagulants: Secondary | ICD-10-CM

## 2011-06-09 DIAGNOSIS — I4891 Unspecified atrial fibrillation: Secondary | ICD-10-CM

## 2011-06-09 LAB — POCT INR: INR: 2

## 2011-06-29 ENCOUNTER — Ambulatory Visit (INDEPENDENT_AMBULATORY_CARE_PROVIDER_SITE_OTHER): Payer: PRIVATE HEALTH INSURANCE | Admitting: *Deleted

## 2011-06-29 DIAGNOSIS — Z7901 Long term (current) use of anticoagulants: Secondary | ICD-10-CM

## 2011-06-29 DIAGNOSIS — I4891 Unspecified atrial fibrillation: Secondary | ICD-10-CM

## 2011-06-29 MED ORDER — WARFARIN SODIUM 2.5 MG PO TABS: 2.5000 mg | ORAL_TABLET | Freq: Every day | ORAL | Status: DC

## 2011-07-08 LAB — URINALYSIS, ROUTINE W REFLEX MICROSCOPIC
Glucose, UA: NEGATIVE mg/dL
Ketones, ur: NEGATIVE mg/dL
pH: 5.5 (ref 5.0–8.0)

## 2011-07-08 LAB — DIFFERENTIAL
Basophils Absolute: 0 10*3/uL (ref 0.0–0.1)
Eosinophils Absolute: 0.1 10*3/uL (ref 0.0–0.7)
Eosinophils Relative: 1 % (ref 0–5)
Lymphs Abs: 1.6 10*3/uL (ref 0.7–4.0)
Monocytes Absolute: 0.7 10*3/uL (ref 0.1–1.0)

## 2011-07-08 LAB — CBC
HCT: 43.1 % (ref 36.0–46.0)
Hemoglobin: 14.2 g/dL (ref 12.0–15.0)
MCV: 89.7 fL (ref 78.0–100.0)
Platelets: 254 10*3/uL (ref 150–400)
RDW: 13.6 % (ref 11.5–15.5)

## 2011-07-08 LAB — BASIC METABOLIC PANEL
BUN: 14 mg/dL (ref 6–23)
Chloride: 102 mEq/L (ref 96–112)
Glucose, Bld: 98 mg/dL (ref 70–99)
Potassium: 3.9 mEq/L (ref 3.5–5.1)
Sodium: 140 mEq/L (ref 135–145)

## 2011-07-08 LAB — URINE MICROSCOPIC-ADD ON

## 2011-07-08 LAB — RAPID URINE DRUG SCREEN, HOSP PERFORMED: Barbiturates: NOT DETECTED

## 2011-07-20 ENCOUNTER — Ambulatory Visit (INDEPENDENT_AMBULATORY_CARE_PROVIDER_SITE_OTHER): Payer: PRIVATE HEALTH INSURANCE | Admitting: *Deleted

## 2011-07-20 DIAGNOSIS — Z7901 Long term (current) use of anticoagulants: Secondary | ICD-10-CM

## 2011-07-20 DIAGNOSIS — I4891 Unspecified atrial fibrillation: Secondary | ICD-10-CM

## 2011-07-20 LAB — POCT INR: INR: 3.2

## 2011-08-10 ENCOUNTER — Ambulatory Visit (INDEPENDENT_AMBULATORY_CARE_PROVIDER_SITE_OTHER): Payer: PRIVATE HEALTH INSURANCE | Admitting: *Deleted

## 2011-08-10 DIAGNOSIS — I4891 Unspecified atrial fibrillation: Secondary | ICD-10-CM

## 2011-08-10 DIAGNOSIS — Z7901 Long term (current) use of anticoagulants: Secondary | ICD-10-CM

## 2011-08-22 ENCOUNTER — Other Ambulatory Visit (HOSPITAL_COMMUNITY): Payer: Self-pay | Admitting: Physician Assistant

## 2011-08-22 ENCOUNTER — Ambulatory Visit (HOSPITAL_COMMUNITY)
Admission: RE | Admit: 2011-08-22 | Discharge: 2011-08-22 | Disposition: A | Discharge status: Home / Self Care | Payer: PRIVATE HEALTH INSURANCE | Source: Ambulatory Visit | Attending: Physician Assistant | Admitting: Physician Assistant

## 2011-08-22 DIAGNOSIS — M25579 Pain in unspecified ankle and joints of unspecified foot: Secondary | ICD-10-CM

## 2011-08-22 DIAGNOSIS — L02419 Cutaneous abscess of limb, unspecified: Secondary | ICD-10-CM | POA: Insufficient documentation

## 2011-08-22 DIAGNOSIS — L02818 Cutaneous abscess of other sites: Secondary | ICD-10-CM

## 2011-08-22 DIAGNOSIS — L03818 Cellulitis of other sites: Secondary | ICD-10-CM

## 2011-08-22 DIAGNOSIS — L03119 Cellulitis of unspecified part of limb: Secondary | ICD-10-CM | POA: Insufficient documentation

## 2011-08-31 ENCOUNTER — Ambulatory Visit (INDEPENDENT_AMBULATORY_CARE_PROVIDER_SITE_OTHER): Payer: PRIVATE HEALTH INSURANCE | Admitting: *Deleted

## 2011-08-31 DIAGNOSIS — I4891 Unspecified atrial fibrillation: Secondary | ICD-10-CM

## 2011-08-31 DIAGNOSIS — Z7901 Long term (current) use of anticoagulants: Secondary | ICD-10-CM

## 2011-09-22 ENCOUNTER — Ambulatory Visit (INDEPENDENT_AMBULATORY_CARE_PROVIDER_SITE_OTHER): Payer: PRIVATE HEALTH INSURANCE | Admitting: *Deleted

## 2011-09-22 DIAGNOSIS — Z7901 Long term (current) use of anticoagulants: Secondary | ICD-10-CM

## 2011-09-22 DIAGNOSIS — I4891 Unspecified atrial fibrillation: Secondary | ICD-10-CM

## 2011-09-22 LAB — POCT INR: INR: 2.1

## 2011-10-20 ENCOUNTER — Ambulatory Visit (INDEPENDENT_AMBULATORY_CARE_PROVIDER_SITE_OTHER): Payer: 59 | Admitting: *Deleted

## 2011-10-20 DIAGNOSIS — Z7901 Long term (current) use of anticoagulants: Secondary | ICD-10-CM

## 2011-10-20 DIAGNOSIS — I4891 Unspecified atrial fibrillation: Secondary | ICD-10-CM

## 2011-10-20 LAB — POCT INR: INR: 1.6

## 2011-10-22 ENCOUNTER — Other Ambulatory Visit: Payer: Self-pay | Admitting: Cardiology

## 2011-11-03 ENCOUNTER — Ambulatory Visit (INDEPENDENT_AMBULATORY_CARE_PROVIDER_SITE_OTHER): Payer: 59 | Admitting: *Deleted

## 2011-11-03 DIAGNOSIS — I4891 Unspecified atrial fibrillation: Secondary | ICD-10-CM

## 2011-11-03 DIAGNOSIS — Z7901 Long term (current) use of anticoagulants: Secondary | ICD-10-CM

## 2011-11-18 ENCOUNTER — Emergency Department (HOSPITAL_COMMUNITY): Payer: PRIVATE HEALTH INSURANCE

## 2011-11-18 ENCOUNTER — Other Ambulatory Visit: Payer: Self-pay

## 2011-11-18 ENCOUNTER — Encounter (HOSPITAL_COMMUNITY): Payer: Self-pay | Admitting: Emergency Medicine

## 2011-11-18 ENCOUNTER — Inpatient Hospital Stay (HOSPITAL_COMMUNITY)
Admission: EM | Admit: 2011-11-18 | Discharge: 2011-11-20 | DRG: 689 | Disposition: A | Discharge status: Home Health | Payer: PRIVATE HEALTH INSURANCE | Attending: Internal Medicine | Admitting: Internal Medicine

## 2011-11-18 DIAGNOSIS — F0390 Unspecified dementia without behavioral disturbance: Secondary | ICD-10-CM | POA: Diagnosis present

## 2011-11-18 DIAGNOSIS — N390 Urinary tract infection, site not specified: Principal | ICD-10-CM | POA: Diagnosis present

## 2011-11-18 DIAGNOSIS — Z7901 Long term (current) use of anticoagulants: Secondary | ICD-10-CM

## 2011-11-18 DIAGNOSIS — G459 Transient cerebral ischemic attack, unspecified: Secondary | ICD-10-CM | POA: Diagnosis present

## 2011-11-18 DIAGNOSIS — G929 Unspecified toxic encephalopathy: Secondary | ICD-10-CM | POA: Diagnosis present

## 2011-11-18 DIAGNOSIS — Z87891 Personal history of nicotine dependence: Secondary | ICD-10-CM

## 2011-11-18 DIAGNOSIS — I959 Hypotension, unspecified: Secondary | ICD-10-CM | POA: Diagnosis present

## 2011-11-18 DIAGNOSIS — Z970 Presence of artificial eye: Secondary | ICD-10-CM

## 2011-11-18 DIAGNOSIS — H9190 Unspecified hearing loss, unspecified ear: Secondary | ICD-10-CM | POA: Diagnosis present

## 2011-11-18 DIAGNOSIS — I509 Heart failure, unspecified: Secondary | ICD-10-CM | POA: Insufficient documentation

## 2011-11-18 DIAGNOSIS — I4891 Unspecified atrial fibrillation: Secondary | ICD-10-CM | POA: Diagnosis present

## 2011-11-18 DIAGNOSIS — I739 Peripheral vascular disease, unspecified: Secondary | ICD-10-CM | POA: Diagnosis present

## 2011-11-18 DIAGNOSIS — Z79899 Other long term (current) drug therapy: Secondary | ICD-10-CM

## 2011-11-18 DIAGNOSIS — G92 Toxic encephalopathy: Secondary | ICD-10-CM | POA: Diagnosis present

## 2011-11-18 DIAGNOSIS — I252 Old myocardial infarction: Secondary | ICD-10-CM

## 2011-11-18 DIAGNOSIS — I2510 Atherosclerotic heart disease of native coronary artery without angina pectoris: Secondary | ICD-10-CM | POA: Diagnosis present

## 2011-11-18 DIAGNOSIS — M810 Age-related osteoporosis without current pathological fracture: Secondary | ICD-10-CM | POA: Diagnosis present

## 2011-11-18 DIAGNOSIS — M4000 Postural kyphosis, site unspecified: Secondary | ICD-10-CM | POA: Diagnosis present

## 2011-11-18 DIAGNOSIS — E785 Hyperlipidemia, unspecified: Secondary | ICD-10-CM | POA: Insufficient documentation

## 2011-11-18 DIAGNOSIS — Z8673 Personal history of transient ischemic attack (TIA), and cerebral infarction without residual deficits: Secondary | ICD-10-CM

## 2011-11-18 DIAGNOSIS — I10 Essential (primary) hypertension: Secondary | ICD-10-CM | POA: Insufficient documentation

## 2011-11-18 DIAGNOSIS — Z9861 Coronary angioplasty status: Secondary | ICD-10-CM

## 2011-11-18 DIAGNOSIS — R5381 Other malaise: Secondary | ICD-10-CM | POA: Diagnosis present

## 2011-11-18 LAB — POCT I-STAT, CHEM 8
Chloride: 102 mEq/L (ref 96–112)
Glucose, Bld: 104 mg/dL — ABNORMAL HIGH (ref 70–99)
HCT: 47 % — ABNORMAL HIGH (ref 36.0–46.0)
Hemoglobin: 16 g/dL — ABNORMAL HIGH (ref 12.0–15.0)
Potassium: 4.7 mEq/L (ref 3.5–5.1)
Sodium: 143 mEq/L (ref 135–145)

## 2011-11-18 LAB — URINALYSIS, ROUTINE W REFLEX MICROSCOPIC
Nitrite: NEGATIVE
Protein, ur: NEGATIVE mg/dL
Specific Gravity, Urine: 1.009 (ref 1.005–1.030)
Urobilinogen, UA: 0.2 mg/dL (ref 0.0–1.0)

## 2011-11-18 LAB — URINE MICROSCOPIC-ADD ON

## 2011-11-18 LAB — CBC
HCT: 46.5 % — ABNORMAL HIGH (ref 36.0–46.0)
MCHC: 31.8 g/dL (ref 30.0–36.0)
Platelets: 181 10*3/uL (ref 150–400)
RDW: 13.6 % (ref 11.5–15.5)
WBC: 9.2 10*3/uL (ref 4.0–10.5)

## 2011-11-18 LAB — CARDIAC PANEL(CRET KIN+CKTOT+MB+TROPI)
Relative Index: INVALID (ref 0.0–2.5)
Troponin I: <0.3 ng/mL (ref <0.30)

## 2011-11-18 LAB — APTT: aPTT: 27 seconds (ref 24–37)

## 2011-11-18 LAB — PROTIME-INR: INR: 1.51 — ABNORMAL HIGH (ref 0.00–1.49)

## 2011-11-18 MED ORDER — ASPIRIN 325 MG PO TABS
325.0000 mg | ORAL_TABLET | Freq: Every day | ORAL | Status: DC
  Administered 2011-11-19: 325 mg via ORAL
  Filled 2011-11-18 (×2): qty 1

## 2011-11-18 MED ORDER — SIMVASTATIN 20 MG PO TABS
20.0000 mg | ORAL_TABLET | Freq: Every day | ORAL | Status: DC
  Administered 2011-11-19: 20 mg via ORAL
  Filled 2011-11-18 (×2): qty 1

## 2011-11-18 MED ORDER — ACETAMINOPHEN 325 MG PO TABS: 650.0000 mg | ORAL_TABLET | ORAL | Status: DC | PRN

## 2011-11-18 MED ORDER — IPRATROPIUM-ALBUTEROL 18-103 MCG/ACT IN AERO
2.0000 | INHALATION_SPRAY | Freq: Four times a day (QID) | RESPIRATORY_TRACT | Status: DC | PRN
  Filled 2011-11-18: qty 14.7

## 2011-11-18 MED ORDER — POLYETHYLENE GLYCOL 3350 17 G PO PACK
17.0000 g | PACK | Freq: Every day | ORAL | Status: DC | PRN
  Filled 2011-11-18: qty 1

## 2011-11-18 MED ORDER — DEXTROSE 5 % IV SOLN
1.0000 g | INTRAVENOUS | Status: DC
  Administered 2011-11-18 – 2011-11-19 (×2): 1 g via INTRAVENOUS
  Filled 2011-11-18 (×3): qty 10

## 2011-11-18 MED ORDER — NITROGLYCERIN 0.4 MG SL SUBL: 0.4000 mg | SUBLINGUAL_TABLET | SUBLINGUAL | Status: DC | PRN

## 2011-11-18 MED ORDER — MEMANTINE HCL 10 MG PO TABS
10.0000 mg | ORAL_TABLET | Freq: Two times a day (BID) | ORAL | Status: DC
  Administered 2011-11-18 – 2011-11-20 (×4): 10 mg via ORAL
  Filled 2011-11-18 (×5): qty 1

## 2011-11-18 MED ORDER — LORAZEPAM 1 MG PO TABS
1.0000 mg | ORAL_TABLET | Freq: Once | ORAL | Status: AC
  Administered 2011-11-18: 1 mg via ORAL
  Filled 2011-11-18: qty 1

## 2011-11-18 MED ORDER — DONEPEZIL HCL 5 MG PO TABS
5.0000 mg | ORAL_TABLET | Freq: Every day | ORAL | Status: DC
  Administered 2011-11-19: 5 mg via ORAL
  Filled 2011-11-18 (×2): qty 1

## 2011-11-18 MED ORDER — LORAZEPAM 0.5 MG PO TABS
0.2500 mg | ORAL_TABLET | Freq: Three times a day (TID) | ORAL | Status: DC | PRN
  Filled 2011-11-18: qty 1

## 2011-11-18 MED ORDER — SODIUM CHLORIDE 0.9 % IV SOLN
INTRAVENOUS | Status: DC
  Administered 2011-11-18 – 2011-11-20 (×5): via INTRAVENOUS

## 2011-11-18 NOTE — ED Notes (Signed)
3020-01 Ready 

## 2011-11-18 NOTE — ED Provider Notes (Signed)
History     CSN: [id number]  Arrival date & time 11/18/11  1455   First MD Initiated Contact with Patient 11/18/11 1512      Chief Complaint  Patient presents with  . Code Stroke    (Consider location/radiation/quality/duration/timing/severity/associated sxs/prior treatment) The history is provided by the EMS personnel. The history is limited by the condition of the patient.   the patient has profound dementia.  She was at home with her caregiver today.  Her care giver said that she became confused and then had right-sided weakness, and right facial droop.  EMS was called.  They say her symptoms have improved en route.  There are no other symptoms reported.  The patient states she does not know why she is here.  She denies pain anywhere.  A level V caveat applies however, because of profound dementia.  Past Medical History  Diagnosis Date  . ASCVD (arteriosclerotic cardiovascular disease)     acute MI in 1993;40%left main,80%LAD, 100%RCA-PTCA; EF of 40%;stress nuclear in2007; normal EF;  mixed inferolateral ischemia and infarction; CHF with normal EF in 2011  . CHF (congestive heart failure)     normal EF;mixed inferolateral ischemic and infarction;chf with normal EF in 2011  . Hypertension   . Hyperlipidemia   . Cerebrovascular disease     CVA by CT in 2007,but not noted in 2009;duplex in 12/2006 plaque without stenosis; Emergency Room evaluation in 12/2010 for transient aphasia and paresis-clopidogrel initially added to ASA Rx, but subsequently warfarin was substituted for these 2 agents  . Tobacco abuse   . Claudication   . Dementia     mild  . Kyphosis     severe  . Decreased hearing   . Atrial fibrillation   . Chronic anticoagulation     Past Surgical History  Procedure Date  . Abdominal hysterectomy   . Appendectomy   . Tonsillectomy   . Enucleation     Right-resulted from infection    No family history on file.  History  Substance Use Topics  . Smoking status:  Former Smoker    Quit date: 01/05/1994  . Smokeless tobacco: Never Used  . Alcohol Use: No    OB History    Grav Para Term Preterm Abortions TAB SAB Ect Mult Living                  Review of Systems  Unable to perform ROS   Allergies  Sulfa antibiotics and Sulfonamide derivatives  Home Medications   Current Outpatient Rx  Name Route Sig Dispense Refill  . IPRATROPIUM-ALBUTEROL 18-103 MCG/ACT IN AERO Inhalation Inhale 2 puffs into the lungs every 6 (six) hours as needed. For shortness of breath    . ALENDRONATE SODIUM 70 MG PO TABS Oral Take 70 mg by mouth every 7 (seven) days. Take with a full glass of water on an empty stomach.     Marland Kitchen VITAMIN C 500 MG PO TABS Oral Take 500 mg by mouth daily.      Marland Kitchen CETIRIZINE HCL 10 MG PO TABS Oral Take 10 mg by mouth daily.      Marland Kitchen VITAMIN D 1000 UNITS PO CAPS Oral Take 1,000 Units by mouth daily.      Marland Kitchen CLOBETASOL PROPIONATE 0.05 % EX FOAM Topical Apply 1 application topically 2 (two) times daily as needed. For psoriasis    . AXONA PO PACK Oral Take 40 g by mouth daily.      . DONEPEZIL HCL 5 MG  PO TABS Oral Take 5 mg by mouth at bedtime.     . FUROSEMIDE 40 MG PO TABS Oral Take 40 mg by mouth daily.    Marland Kitchen LORAZEPAM 0.5 MG PO TABS Oral Take 0.5 mg by mouth every 8 (eight) hours. 1/4 tab am 1/2 tab pm     . MEMANTINE HCL 10 MG PO TABS Oral Take 10 mg by mouth 2 (two) times daily.     . MULTI-VITAMIN/MINERALS PO TABS Oral Take 1 tablet by mouth daily.      Marland Kitchen NITROGLYCERIN 0.4 MG SL SUBL Sublingual Place 0.4 mg under the tongue every 5 (five) minutes as needed. For chest pain    . FISH OIL 1000 MG PO CAPS Oral Take 1 capsule by mouth 2 (two) times daily.     Marland Kitchen POLYETHYLENE GLYCOL 3350 PO Oral Take 1 Units by mouth daily.      Marland Kitchen POTASSIUM CHLORIDE CRYS ER 20 MEQ PO TBCR Oral Take 20 mEq by mouth daily.      Marland Kitchen PRAVASTATIN SODIUM 40 MG PO TABS Oral Take 40 mg by mouth daily.    . WARFARIN SODIUM 2.5 MG PO TABS Oral Take 2.5 mg by mouth daily. TAKE  1 TABLET (2.5 MG TOTAL) BY MOUTH DAILY. TAKE 1 TABLET DAILY EXCEPT 1/2 TABLET ON T,TH,SAT    . SALEX 6 % (LOTION) EX KIT Apply externally Apply topically.        BP 102/81  Pulse 58  Resp 16  SpO2 95%  Physical Exam  Constitutional: She appears well-developed and well-nourished. No distress.  HENT:  Head: Normocephalic and atraumatic.  Eyes:       Ptosis right eye, however, she has an artificial right eye  Neck: Normal range of motion. Neck supple.  Cardiovascular:  Murmur heard.      Irregular  Pulmonary/Chest: Effort normal and breath sounds normal. No respiratory distress.  Abdominal: Soft. Bowel sounds are normal. She exhibits no distension. There is no tenderness.  Musculoskeletal: Normal range of motion. She exhibits no edema.  Neurological: She is alert.  Skin: Skin is warm and dry.  Psychiatric:       Confused    ED Course  Procedures (including critical care time) 78 year old, female, with profound dementia, and atrial fibrillation presents to emergency department after she had reported episode of right-sided weakness, confusion, and right facial droop.  She has a chronic right ptosis associated with an artificial right eye.  CAT scan did not show an acute stroke.  The neurologist, thinks, that she's had a TIA.  So we will complete her evaluation and then get her admitted to medicine for further evaluation. Labs Reviewed  POCT I-STAT, CHEM 8 - Abnormal; Notable for the following:    Creatinine, Ser 1.20 (*)    Glucose, Bld 104 (*)    Hemoglobin 16.0 (*)    HCT 47.0 (*)    All other components within normal limits  PROTIME-INR - Abnormal; Notable for the following:    Prothrombin Time 18.5 (*)    INR 1.51 (*)    All other components within normal limits  CBC - Abnormal; Notable for the following:    HCT 46.5 (*)    All other components within normal limits  APTT  URINALYSIS, ROUTINE W REFLEX MICROSCOPIC   Ct Head Wo Contrast  11/18/2011  *RADIOLOGY REPORT*   Clinical Data: Code stroke.  Sudden onset right-sided weakness  CT HEAD WITHOUT CONTRAST  Technique:  Contiguous axial images were obtained  from the base of the skull through the vertex without contrast.  Comparison: CT 12/11/2010  Findings: Generalized atrophy.  Chronic ischemic changes throughout the white matter are stable from the  prior study.  No acute infarct.  No acute hemorrhage or mass.  Extensive calcification is present in the interhemispheric fissure on the right extending posteriorly into the occipital lobe.  These are stable and unchanged from 03/12/2006.  These appear to be tubular  vascular calcifications.  This could be due to a vascular malformation.  No surrounding edema or hemorrhage is present.  This is unchanged from prior studies.  Right eye prosthesis is noted.  Calvarium is intact.  IMPRESSION: Atrophy and chronic ischemic change.  No acute infarct.  Tubular vascular calcifications right occipital parietal lobe are unchanged from 2007.  This may reflect underlying vascular malformation.  These results were called by telephone on 11/18/2011  at  1515 hours to  Dr. Christianne Borrow, who verbally acknowledged these results.  Original Report Authenticated By: Camelia Phenes, M.D.     No diagnosis found.  ED ECG REPORT   Date: 11/18/2011  EKG Time: 5:02 PM  Rate: 82  Rhythm: atrial fibrillation,    Axis: nl  Intervals:right bundle branch block  ST&T Change: nonspefic  Narrative Interpretation: afib with rbbb and nonspecific sttw changes          i spoke with [facility]. She will arrange admission   MDM  Dementia, with chronic confusion Reported history of right facial droop and right sided weakness.  This is resolved, except for the right ptosis, which is chronic  No evidence of acute stroke       Nicholes Stairs, MD 11/18/11 [phone]

## 2011-11-18 NOTE — Consult Note (Addendum)
Chief Complaint: "  HPI: Angela [name] is an 78 y.o. female who had sudden onset confusion with right facial droop and right hemiparesis that started at 2:00 pm. Not t-PA candidate due to NIHSS of 3 on arrival to ED. All deficits believed to be attributed to pre-existing dementia and eye surgery with eye prosthesis in right eye. Symptoms have resolved on arrival to ED.   LSN: 14:00 tPA Given: No: low NIHSS, deficits believed to be pre-existing mRankin: 4  Past Medical History  Diagnosis Date  . ASCVD (arteriosclerotic cardiovascular disease)     acute MI in 1993;40%left main,80%LAD, 100%RCA-PTCA; EF of 40%;stress nuclear in2007; normal EF;  mixed inferolateral ischemia and infarction; CHF with normal EF in 2011  . CHF (congestive heart failure)     normal EF;mixed inferolateral ischemic and infarction;chf with normal EF in 2011  . Hypertension   . Hyperlipidemia   . Cerebrovascular disease     CVA by CT in 2007,but not noted in 2009;duplex in 12/2006 plaque without stenosis; Emergency Room evaluation in 12/2010 for transient aphasia and paresis-clopidogrel initially added to ASA Rx, but subsequently warfarin was substituted for these 2 agents  . Tobacco abuse   . Claudication   . Dementia     mild  . Kyphosis     severe  . Decreased hearing   . Atrial fibrillation   . Chronic anticoagulation    Past Surgical History  Procedure Date  . Abdominal hysterectomy   . Appendectomy   . Tonsillectomy   . Enucleation     Right-resulted from infection   No family history on file. Social History:  reports that she quit smoking about 17 years ago. She has never used smokeless tobacco. She reports that she does not drink alcohol or use illicit drugs.  Allergies:  Allergies  Allergen Reactions  . Sulfa Antibiotics   . Sulfonamide Derivatives    Medications: I have reviewed the patient's current medications.  ROS: as above  Physical Examination: Blood pressure 102/81, pulse 58,  resp. rate 16, SpO2 95.00%.  Neurologic Examination: Neurological exam: AAO*1. No aphasia. Followed simple commands. Cranial nerves: EOMI, PERRL. Visual fields were full. Sensation to V1 through V3 areas of the face was intact and symmetric throughout. There was no facial asymmetry. Hearing to finger rub was equal and symmetrical bilaterally. Shoulder shrug was 5/5 and symmetric bilaterally. Head rotation was 5/5 bilaterally. There was no dysarthria or palatal deviation. Motor: strength was 5/5 and symmetric throughout. Sensory: was intact throughout to light touch, pinprick, vibration and proprioception. Coordination: finger-to-nose and heel-to-shin were intact and symmetric bilaterally. Reflexes: were 2+ in upper extremities and 1+ at the knees and 1+ at the ankles. Plantar response was downgoing bilaterally. Gait: deferred  Results for orders placed during the hospital encounter of 11/18/11 (from the past 48 hour(s))  POCT I-STAT, CHEM 8     Status: Abnormal   Collection Time   11/18/11  2:59 PM      Component Value Range Comment   Sodium 143  135 - 145 (mEq/L)    Potassium 4.7  3.5 - 5.1 (mEq/L)    Chloride 102  96 - 112 (mEq/L)    BUN 14  6 - 23 (mg/dL)    Creatinine, Ser 1.61 (*) 0.50 - 1.10 (mg/dL)    Glucose, Bld 096 (*) 70 - 99 (mg/dL)    Calcium, Ion 0.45  1.12 - 1.32 (mmol/L)    TCO2 33  0 - 100 (mmol/L)  Hemoglobin 16.0 (*) 12.0 - 15.0 (g/dL)    HCT 41.3 (*) 24.4 - 46.0 (%)   PROTIME-INR     Status: Abnormal   Collection Time   11/18/11  3:06 PM      Component Value Range Comment   Prothrombin Time 18.5 (*) 11.6 - 15.2 (seconds)    INR 1.51 (*) 0.00 - 1.49    APTT     Status: Normal   Collection Time   11/18/11  3:06 PM      Component Value Range Comment   aPTT 27  24 - 37 (seconds)   CBC     Status: Abnormal   Collection Time   11/18/11  3:06 PM      Component Value Range Comment   WBC 9.2  4.0 - 10.5 (K/uL) WHITE COUNT CONFIRMED ON SMEAR   RBC 4.90  3.87 - 5.11  (MIL/uL)    Hemoglobin 14.8  12.0 - 15.0 (g/dL)    HCT 01.0 (*) 27.2 - 46.0 (%)    MCV 94.9  78.0 - 100.0 (fL)    MCH 30.2  26.0 - 34.0 (pg)    MCHC 31.8  30.0 - 36.0 (g/dL)    RDW 53.6  64.4 - 03.4 (%)    Platelets 181  150 - 400 (K/uL)    Ct Head Wo Contrast  11/18/2011  *RADIOLOGY REPORT*  Clinical Data: Code stroke.  Sudden onset right-sided weakness  CT HEAD WITHOUT CONTRAST  Technique:  Contiguous axial images were obtained from the base of the skull through the vertex without contrast.  Comparison: CT 12/11/2010  Findings: Generalized atrophy.  Chronic ischemic changes throughout the white matter are stable from the  prior study.  No acute infarct.  No acute hemorrhage or mass.  Extensive calcification is present in the interhemispheric fissure on the right extending posteriorly into the occipital lobe.  These are stable and unchanged from 03/12/2006.  These appear to be tubular  vascular calcifications.  This could be due to a vascular malformation.  No surrounding edema or hemorrhage is present.  This is unchanged from prior studies.  Right eye prosthesis is noted.  Calvarium is intact.  IMPRESSION: Atrophy and chronic ischemic change.  No acute infarct.  Tubular vascular calcifications right occipital parietal lobe are unchanged from 2007.  This may reflect underlying vascular malformation.  These results were called by telephone on 11/18/2011  at  1515 hours to  Dr. Christianne Borrow, who verbally acknowledged these results.  Original Report Authenticated By: Camelia Phenes, M.D.   Assessment: 78 y.o. female who came in with transient right hemiparesis and right facial droop that resolved in ED. Not t-PA candidate due to low NIHSS. Likely TIA.   Stroke Risk Factors - atrial fibrillation  Plan: 1. HgbA1c, fasting lipid panel 2. MRI, MRA  of the brain without contrast 3. PT consult, OT consult, Speech consult 4. Echocardiogram 5. Carotid dopplers 6. Prophylactic therapy-Anticoagulation: Coumadin-  dose 2.5 mg PO daily - INR subtherpeutic, please address if no new CVA on MRI. If New CVA, call neurology 7. Risk factor modification 8. Cardiac monitoring 9. DVT prophylaxis 10. Neurochecks q4h 11. IV fluids 12. MAP b/w 120-130  (If above not ordered, please give reasons why)  Janit Cutter 11/18/2011, 4:09 PM

## 2011-11-18 NOTE — ED Notes (Signed)
Code stroke encoded 1429 Code stroke called 1430 Pt arrival 1443 edp exam 1443 Stroke team arrival 1443 Last seen normal 1400 Pt arrival in ct 1446

## 2011-11-18 NOTE — ED Notes (Signed)
Pt son states pt has sundowners and usually gets agitated in the evenings.  States pt takes lorazepam at home to help.  Pt son phone number is [phone] if needed call, pt daughter number is [phone] if needed call.

## 2011-11-18 NOTE — ED Notes (Signed)
Per ems- pt coming from home where she had been with her caregiver today and at 1400 began to have confusion, right sided weakness, right facial droop, right arm drift.  Ems state dysphagia got progressively better en route.  Pt is on coumadin.

## 2011-11-18 NOTE — H&P (Signed)
PCP:   Cassell Smiles., MD, MD    Chief Complaint:   Slurred speech  HPI: Angela [name] is a 78 y.o. female   has a past medical history of ASCVD (arteriosclerotic cardiovascular disease); CHF (congestive heart failure); Hypertension; Hyperlipidemia; Cerebrovascular disease; Tobacco abuse; Claudication; Dementia; Kyphosis; Decreased hearing; Atrial fibrillation; and Chronic anticoagulation.   Presented with  Episode of slurred speech and leg weakness, possible fascia droop of left side starting at noon By the time she got to [facility] all symptoms has resolved. Patient was at her baseline of health prior to this symptoms. She does have home with her son but during day the son isn't working she has a care provider who is taking care of her. Today they were out eating lunch when they returned home patient fell on well but could not reach rate exactly reach manner. The care provider noted possible on a slurred speech and facial droop unsure which side as well as a patch and sublingual leg weakness also sure which side at which point she activated emergency system and patient was brought in to [facility]. There she was evaluated by a neurologist who felt that this possibly TIA and wanted patient to be admitted and evaluated further. Of note last year she had similar presentation and was evaluated at [facility]. She had Dopplers of the carotids that showed mild stenosis. Back then she was not considered a candidate for any significant intervention. Of note patient has history of atrial fibrillation and has been on Coumadin. Her INR is slightly subtherapeutic today.  Review of Systems:   Pertinent positives include: Patient becomes irritable and requires regular doses of Ativan to calm her down  Constitutional:  No weight loss, night sweats, Fevers, chills, fatigue.  HEENT:  No headaches, Difficulty swallowing,Tooth/dental problems,Sore throat,  No sneezing, itching, ear ache, nasal congestion, post nasal drip,   Cardio-vascular:  No chest pain, Orthopnea, PND, anasarca, dizziness, palpitations.no Bilateral lower extremity swelling  GI:  No heartburn, indigestion, abdominal pain, nausea, vomiting, diarrhea, change in bowel habits, loss of appetite, melena, blood in stool, hematoemesis Resp:  no shortness of breath at rest. No dyspnea on exertion, No excess mucus, no productive cough, No non-productive cough, No coughing up of blood.No change in color of mucus.No wheezing.No chest wall deformity  Skin:  no rash or lesions.  GU:  no dysuria, change in color of urine, no urgency or frequency. No flank pain.  Musculoskeletal:  No joint pain or swelling. No decreased range of motion. No back pain.  Psych:  No change in mood or affect. No depression or anxiety. No memory loss.  Neuro: no localizing neurological complaints, no tingling, no weakness, no double vision, no gait abnormality, no slurred speech   Otherwise ROS are negative except for above, 10 systems were reviewed  Past Medical History: Past Medical History  Diagnosis Date  . ASCVD (arteriosclerotic cardiovascular disease)     acute MI in 1993;40%left main,80%LAD, 100%RCA-PTCA; EF of 40%;stress nuclear in2007; normal EF;  mixed inferolateral ischemia and infarction; CHF with normal EF in 2011  . CHF (congestive heart failure)     normal EF;mixed inferolateral ischemic and infarction;chf with normal EF in 2011  . Hypertension   . Hyperlipidemia   . Cerebrovascular disease     CVA by CT in 2007,but not noted in 2009;duplex in 12/2006 plaque without stenosis; Emergency Room evaluation in 12/2010 for transient aphasia and paresis-clopidogrel initially added to ASA Rx, but subsequently warfarin was substituted for these  2 agents  . Tobacco abuse   . Claudication   . Dementia     mild  . Kyphosis     severe  . Decreased hearing   . Atrial fibrillation   . Chronic anticoagulation    Past Surgical History  Procedure Date  . Abdominal  hysterectomy   . Appendectomy   . Tonsillectomy   . Enucleation     Right-resulted from infection     Medications: Prior to Admission medications   Medication Sig Start Date End Date Taking? Authorizing Provider  albuterol-ipratropium (COMBIVENT) 18-103 MCG/ACT inhaler Inhale 2 puffs into the lungs every 6 (six) hours as needed. For shortness of breath   Yes Historical Provider, MD  alendronate (FOSAMAX) 70 MG tablet Take 70 mg by mouth every 7 (seven) days. Take with a full glass of water on an empty stomach.    Yes Historical Provider, MD  Ascorbic Acid (VITAMIN C) 500 MG tablet Take 500 mg by mouth daily.     Yes Historical Provider, MD  cetirizine (ZYRTEC) 10 MG tablet Take 10 mg by mouth daily.     Yes Historical Provider, MD  Cholecalciferol (VITAMIN D) 1000 UNITS capsule Take 1,000 Units by mouth daily.     Yes Historical Provider, MD  clobetasol (OLUX) 0.05 % topical foam Apply 1 application topically 2 (two) times daily as needed. For psoriasis   Yes Historical Provider, MD  Dietary Management Product (AXONA) packet Take 40 g by mouth daily.     Yes Historical Provider, MD  donepezil (ARICEPT) 5 MG tablet Take 5 mg by mouth at bedtime.    Yes Historical Provider, MD  furosemide (LASIX) 40 MG tablet Take 40 mg by mouth daily.   Yes Historical Provider, MD  LORazepam (ATIVAN) 0.5 MG tablet Take 0.5 mg by mouth every 8 (eight) hours. 1/4 tab am 1/2 tab pm    Yes Historical Provider, MD  memantine (NAMENDA) 10 MG tablet Take 10 mg by mouth 2 (two) times daily.    Yes Historical Provider, MD  Multiple Vitamins-Minerals (MULTIVITAMIN WITH MINERALS) tablet Take 1 tablet by mouth daily.     Yes Historical Provider, MD  nitroGLYCERIN (NITROSTAT) 0.4 MG SL tablet Place 0.4 mg under the tongue every 5 (five) minutes as needed. For chest pain 05/25/11 05/24/12 Yes Joni Reining, NP  Omega-3 Fatty Acids (FISH OIL) 1000 MG CAPS Take 1 capsule by mouth 2 (two) times daily.    Yes Historical  Provider, MD  POLYETHYLENE GLYCOL 3350 PO Take 1 Units by mouth daily.     Yes Historical Provider, MD  potassium chloride SA (K-DUR,KLOR-CON) 20 MEQ tablet Take 20 mEq by mouth daily.     Yes Historical Provider, MD  pravastatin (PRAVACHOL) 40 MG tablet Take 40 mg by mouth daily.   Yes Historical Provider, MD  warfarin (COUMADIN) 2.5 MG tablet Take 2.5 mg by mouth daily. TAKE 1 TABLET (2.5 MG TOTAL) BY MOUTH DAILY. TAKE 1 TABLET DAILY EXCEPT 1/2 TABLET ON T,TH,SAT   Yes Historical Provider, MD  Salicylic Acid-Cleanser (SALEX) 6 % (LOTION) KIT Apply topically.      Historical Provider, MD    Allergies:   Allergies  Allergen Reactions  . Sulfa Antibiotics Other (See Comments)    uknown  . Sulfonamide Derivatives Other (See Comments)    unknown    Social History:  Ambulatory walks with assistance, refuses to use walker Lives at home with family and have a sitter with her during the day  reports that she quit smoking about 17 years ago. She has never used smokeless tobacco. She reports that she does not drink alcohol or use illicit drugs.   Family History: family history includes Atrial fibrillation in her son; Dementia in her mother; and Hypertension in her son.    Physical Exam: Patient Vitals for the past 24 hrs:  BP Pulse Resp SpO2  11/18/11 1930 103/71 mmHg - 21  -  11/18/11 1900 122/90 mmHg - 23  -  11/18/11 1859 103/71 mmHg 89  16  98 %  11/18/11 1703 106/71 mmHg - 21  95 %  11/18/11 1700 106/71 mmHg 83  19  96 %  11/18/11 1600 102/81 mmHg 58  16  95 %  11/18/11 1530 102/67 mmHg 66  16  95 %  11/18/11 1456 - - - 100 %    1. General:  in No Acute distress 2. Psychological: Alert and Oriented to self but not situation 3. Head/ENT:   Moist  Mucous Membranes                          Head Non traumatic, neck supple                          Normal  Dentition 4. SKIN: decreased Skin turgor,  Skin clean Dry and intact no rash 5. Heart: iregular rate and rhythm no Murmur,  Rub or gallop 6. Lungs: Clear to auscultation bilaterally, no wheezes or crackles   7. Abdomen: Soft, non-tender, Non distended 8. Lower extremities: no clubbing, cyanosis, or edema 9. Neurologically Grossly intact, moving all 4 extremities equally, no droop noted grips equal bilaterally. Given dementia patient has hard time cooperating with exam 10. MSK: Normal range of motion  body mass index is unknown because there is no height or weight on file.   Labs on Admission:   [facility] 11/18/11 1459  NA 143  K 4.7  CL 102  CO2 --  GLUCOSE 104*  BUN 14  CREATININE 1.20*  CALCIUM --  MG --  PHOS --   No results found for this basename: AST:2,ALT:2,ALKPHOS:2,BILITOT:2,PROT:2,ALBUMIN:2 in the last 72 hours No results found for this basename: LIPASE:2,AMYLASE:2 in the last 72 hours  Basename 11/18/11 1506 11/18/11 1459  WBC 9.2 --  NEUTROABS -- --  HGB 14.8 16.0*  HCT 46.5* 47.0*  MCV 94.9 --  PLT 181 --   No results found for this basename: CKTOTAL:3,CKMB:3,CKMBINDEX:3,TROPONINI:3 in the last 72 hours No results found for this basename: TSH,T4TOTAL,FREET3,T3FREE,THYROIDAB in the last 72 hours No results found for this basename: VITAMINB12:2,FOLATE:2,FERRITIN:2,TIBC:2,IRON:2,RETICCTPCT:2 in the last 72 hours No results found for this basename: HGBA1C    The CrCl is unknown because both a height and weight (above a minimum accepted value) are required for this calculation. ABG    Component Value Date/Time   TCO2 33 11/18/2011 1459     Lab Results  Component Value Date   DDIMER  Value: 0.98        AT THE INHOUSE ESTABLISHED CUTOFF VALUE OF 0.48 ug/mL FEU, THIS ASSAY HAS BEEN DOCUMENTED IN THE LITERATURE TO HAVE A SENSITIVITY AND NEGATIVE PREDICTIVE VALUE OF AT LEAST 98 TO 99%.  THE TEST RESULT SHOULD BE CORRELATED WITH AN ASSESSMENT OF THE CLINICAL PROBABILITY OF DVT / VTE.* 08/01/2010     Other results:  I have pearsonaly reviewed this: ECG REPORT  Rate:88  Rhythm:  Atrial with frequent  PVCs  ST&T Change: no significant change from prior  UA wbc too numerous to count   Cultures: No results found for this basename: sdes, specrequest, cult, reptstatus       Radiological Exams on Admission: Ct Head Wo Contrast  11/18/2011  *RADIOLOGY REPORT*  Clinical Data: Code stroke.  Sudden onset right-sided weakness  CT HEAD WITHOUT CONTRAST  Technique:  Contiguous axial images were obtained from the base of the skull through the vertex without contrast.  Comparison: CT 12/11/2010  Findings: Generalized atrophy.  Chronic ischemic changes throughout the white matter are stable from the  prior study.  No acute infarct.  No acute hemorrhage or mass.  Extensive calcification is present in the interhemispheric fissure on the right extending posteriorly into the occipital lobe.  These are stable and unchanged from 03/12/2006.  These appear to be tubular  vascular calcifications.  This could be due to a vascular malformation.  No surrounding edema or hemorrhage is present.  This is unchanged from prior studies.  Right eye prosthesis is noted.  Calvarium is intact.  IMPRESSION: Atrophy and chronic ischemic change.  No acute infarct.  Tubular vascular calcifications right occipital parietal lobe are unchanged from 2007.  This may reflect underlying vascular malformation.  These results were called by telephone on 11/18/2011  at  1515 hours to  Dr. Christianne Borrow, who verbally acknowledged these results.  Original Report Authenticated By: Camelia Phenes, M.D.    Assessment/Plan  This is an 78 year old female with past history of atrial rotation and TIAs in the past presents with questionable TIA but very vague symptoms. She does have urinary tract infection and appears to be slightly fluid down with slight hypotension.  Present on Admission:  .UTI (lower urinary tract infection) - will cover with Rocephin await urine culture  .TIA (transient ischemic attack) -  - will admit based on  TIA/CVA protocol, await results of MRA/MRI, Carotid Doppler and Echo, obtain cardiac enzymes,  ECG, Homocysteine, Lipid panel, TSH. Order PT/OT evaluation. Will make sure patient is on antiplatelet agent. Consider Neurology consult.      Marland KitchenHypotension - give gentle IV fluids watch for any evidence of sepsis hold her blood pressure medications  .DEMENTIA - continue home medications given agitation and possible delirium Will give Ativan as needed  .Atrial fibrillation  - continue Coumadin  .ASCVD (arteriosclerotic cardiovascular disease) - cycle cardiac markers and given the vague symptoms and elderly age   Prophylaxis: Patient is on Coumadin  CODE STATUS: Patient's family at this point leaning towards full code   Zack Crager 11/18/2011, 8:01 PM

## 2011-11-19 ENCOUNTER — Encounter (HOSPITAL_COMMUNITY): Payer: Self-pay | Admitting: *Deleted

## 2011-11-19 ENCOUNTER — Other Ambulatory Visit (HOSPITAL_COMMUNITY): Payer: 59

## 2011-11-19 ENCOUNTER — Inpatient Hospital Stay (HOSPITAL_COMMUNITY): Payer: PRIVATE HEALTH INSURANCE

## 2011-11-19 DIAGNOSIS — I4891 Unspecified atrial fibrillation: Secondary | ICD-10-CM

## 2011-11-19 LAB — GLUCOSE, CAPILLARY
Glucose-Capillary: 85 mg/dL (ref 70–99)
Glucose-Capillary: 86 mg/dL (ref 70–99)
Glucose-Capillary: 107 mg/dL — ABNORMAL HIGH (ref 70–99)

## 2011-11-19 LAB — LIPID PANEL
HDL: 41 mg/dL (ref >39)
LDL Cholesterol: 52 mg/dL (ref 0–99)
Triglycerides: 96 mg/dL (ref <150)
VLDL: 19 mg/dL (ref 0–40)

## 2011-11-19 LAB — CARDIAC PANEL(CRET KIN+CKTOT+MB+TROPI)
CK, MB: 2 ng/mL (ref 0.3–4.0)
Troponin I: <0.3 ng/mL (ref <0.30)

## 2011-11-19 LAB — HEMOGLOBIN A1C: Hgb A1c MFr Bld: 5.2 % (ref <5.7)

## 2011-11-19 MED ORDER — HALOPERIDOL 0.5 MG PO TABS
0.5000 mg | ORAL_TABLET | Freq: Every day | ORAL | Status: DC
  Administered 2011-11-19: 0.5 mg via ORAL
  Filled 2011-11-19 (×2): qty 1

## 2011-11-19 MED ORDER — WARFARIN SODIUM 5 MG PO TABS
5.0000 mg | ORAL_TABLET | Freq: Once | ORAL | Status: AC
  Administered 2011-11-19: 5 mg via ORAL
  Filled 2011-11-19: qty 1

## 2011-11-19 MED ORDER — HALOPERIDOL LACTATE 5 MG/ML IJ SOLN
5.0000 mg | Freq: Once | INTRAMUSCULAR | Status: AC
  Administered 2011-11-19: 5 mg via INTRAVENOUS
  Filled 2011-11-19: qty 1

## 2011-11-19 NOTE — Progress Notes (Signed)
Around 0100. Pt had run of Vtach. Pt asymptomatic. Elray Mcgregor, NP called regarding the episode. Pt will continue to be monitored. No orders at this time.  Salvadore Oxford, RN 11/19/11 [phone]

## 2011-11-19 NOTE — Progress Notes (Signed)
Subjective: Sleepy but easily aroused; patient with overnight agitation requiring ativan and haldol. AAOX3; mild dysarthria.  Objective: Vital signs in last 24 hours: Temp:  [97.7 F (36.5 C)-97.9 F (36.6 C)] 97.9 F (36.6 C) (02/16 0600) Pulse Rate:  [58-89] 62  (02/16 0600) Resp:  [16-27] 19  (02/16 0600) BP: (91-122)/(62-90) 98/68 mmHg (02/16 0600) SpO2:  [92 %-100 %] 95 % (02/16 0600) Weight:  [58.4 kg (128 lb 12 oz)] 58.4 kg (128 lb 12 oz) (02/15 2200) Weight change:     Intake/Output from previous day: 02/15 0701 - 02/16 0700 In: 1812.5 [I.V.:1762.5; IV Piggyback:50] Out: -      Physical Exam: General: Alert, awake, oriented x3, in no acute distress. Mild dysarthria HEENT: No bruits, no goiter. Heart: S1 and S2; positive systolic murmur Lungs: Clear to auscultation bilaterally. Abdomen: Soft, nontender, nondistended, positive bowel sounds. Extremities: No clubbing, cyanosis or edema; with positive pedal pulses. Neuro: Right blindness; mild dysarthria and 4/5 MS on her right side (probably due to poor effort); otherwise nonfocal.  Lab Results: Basic Metabolic Panel:  Basename 11/18/11 1459  NA 143  K 4.7  CL 102  CO2 --  GLUCOSE 104*  BUN 14  CREATININE 1.20*  CALCIUM --  MG --  PHOS --   Liver Function Tests: No results found for this basename: AST:2,ALT:2,ALKPHOS:2,BILITOT:2,PROT:2,ALBUMIN:2 in the last 72 hours No results found for this basename: LIPASE:2,AMYLASE:2 in the last 72 hours No results found for this basename: AMMONIA:2 in the last 72 hours CBC:  Basename 11/18/11 1506 11/18/11 1459  WBC 9.2 --  NEUTROABS -- --  HGB 14.8 16.0*  HCT 46.5* 47.0*  MCV 94.9 --  PLT 181 --   Cardiac Enzymes:  Basename 11/19/11 0600 11/18/11 2216  CKTOTAL 33 30  CKMB 2.0 1.9  CKMBINDEX -- --  TROPONINI <0.30 <0.30   CBG:  Basename 11/19/11 0710  GLUCAP 85   Fasting Lipid Panel:  Basename 11/19/11 0600  CHOL 112  HDL 41  LDLCALC 52  TRIG 96   CHOLHDL 2.7  LDLDIRECT --   Coagulation:  Basename 11/18/11 1506  LABPROT 18.5*  INR 1.51*   Urine Drug Screen: Drugs of Abuse     Component Value Date/Time   LABOPIA NONE DETECTED 10/01/2008 1330   COCAINSCRNUR NONE DETECTED 10/01/2008 1330   LABBENZ NONE DETECTED 10/01/2008 1330   AMPHETMU NONE DETECTED 10/01/2008 1330   THCU NONE DETECTED 10/01/2008 1330   LABBARB  Value: NONE DETECTED        DRUG SCREEN FOR MEDICAL PURPOSES ONLY.  IF CONFIRMATION IS NEEDED FOR ANY PURPOSE, NOTIFY LAB WITHIN 5 DAYS. 10/01/2008 1330    Alcohol Level: No results found for this basename: ETH:2 in the last 72 hours Urinalysis:  Basename 11/18/11 1738  COLORURINE YELLOW  LABSPEC 1.009  PHURINE 7.0  GLUCOSEU NEGATIVE  HGBUR TRACE*  BILIRUBINUR NEGATIVE  KETONESUR NEGATIVE  PROTEINUR NEGATIVE  UROBILINOGEN 0.2  NITRITE NEGATIVE  LEUKOCYTESUR LARGE*   Studies/Results: Ct Head Wo Contrast  11/18/2011  *RADIOLOGY REPORT*  Clinical Data: Code stroke.  Sudden onset right-sided weakness  CT HEAD WITHOUT CONTRAST  Technique:  Contiguous axial images were obtained from the base of the skull through the vertex without contrast.  Comparison: CT 12/11/2010  Findings: Generalized atrophy.  Chronic ischemic changes throughout the white matter are stable from the  prior study.  No acute infarct.  No acute hemorrhage or mass.  Extensive calcification is present in the interhemispheric fissure on the right extending posteriorly  into the occipital lobe.  These are stable and unchanged from 03/12/2006.  These appear to be tubular  vascular calcifications.  This could be due to a vascular malformation.  No surrounding edema or hemorrhage is present.  This is unchanged from prior studies.  Right eye prosthesis is noted.  Calvarium is intact.  IMPRESSION: Atrophy and chronic ischemic change.  No acute infarct.  Tubular vascular calcifications right occipital parietal lobe are unchanged from 2007.  This may reflect  underlying vascular malformation.  These results were called by telephone on 11/18/2011  at  1515 hours to  Dr. Christianne Borrow, who verbally acknowledged these results.  Original Report Authenticated By: Camelia Phenes, M.D.   Medications: Scheduled Meds:   . aspirin  325 mg Oral Daily  . cefTRIAXone (ROCEPHIN) IVPB 1 gram/50 mL D5W  1 g Intravenous Q24H  . donepezil  5 mg Oral QHS  . haloperidol lactate  5 mg Intravenous Once  . LORazepam  1 mg Oral Once  . memantine  10 mg Oral BID  . simvastatin  20 mg Oral q1800  . warfarin  5 mg Oral ONCE-1800   Continuous Infusions:   . sodium chloride 125 mL/hr at 11/19/11 0359   PRN Meds:.acetaminophen, albuterol-ipratropium, LORazepam, nitroGLYCERIN, polyethylene glycol  Assessment/Plan: 1-TIA (transient ischemic attack): will finish and follow results for acute TIA work up; especially with risk fx's and the fact she was subtherapeutic on her INR. Other consideration for her confusion and Transient TIA symptoms is TME due to UTI. Will continue rocephin; coumadin per pharmacy and ASA while patient is subtherapeutic. Appreciate neurology assistance  2-DEMENTIA: continue namenda and aricept. Continue supportive care.  3-Atrial fibrillation: rate control. Continue coumadin, INR goal 2-3.  4-UTI (lower urinary tract infection):Cx pending; continue rocephin for now.  5-HLD: continue statins.  6-presumed TME: due to UTI; continue antibiotics. Will be cautious with sedating medications.  7-DVT: coumadin per pharmacy and SCD's while subtherapeutic.    LOS: 1 day   Cartina Brousseau [facility] [phone]  11/19/2011, 10:12 AM

## 2011-11-19 NOTE — Progress Notes (Signed)
Physical Therapy Evaluation Patient Details Name: Angela [name] MRN: [id number] DOB: 25-Apr-1921 Today's Date: 11/19/2011  Problem List:  Patient Active Problem List  Diagnoses  . HYPERLIPIDEMIA  . DEMENTIA  . HYPERTENSION  . KYPHOSIS  . Atrial fibrillation  . Chronic anticoagulation  . Cerebrovascular disease  . ASCVD (arteriosclerotic cardiovascular disease)  . CHF (congestive heart failure)  . Tobacco abuse, in remission  . Claudication  . UTI (lower urinary tract infection)  . TIA (transient ischemic attack)  . Hypotension    Past Medical History:  Past Medical History  Diagnosis Date  . ASCVD (arteriosclerotic cardiovascular disease)     acute MI in 1993;40%left main,80%LAD, 100%RCA-PTCA; EF of 40%;stress nuclear in2007; normal EF;  mixed inferolateral ischemia and infarction; CHF with normal EF in 2011  . CHF (congestive heart failure)     normal EF;mixed inferolateral ischemic and infarction;chf with normal EF in 2011  . Hypertension   . Hyperlipidemia   . Cerebrovascular disease     CVA by CT in 2007,but not noted in 2009;duplex in 12/2006 plaque without stenosis; Emergency Room evaluation in 12/2010 for transient aphasia and paresis-clopidogrel initially added to ASA Rx, but subsequently warfarin was substituted for these 2 agents  . Tobacco abuse   . Claudication   . Dementia     mild  . Kyphosis     severe  . Decreased hearing   . Atrial fibrillation   . Chronic anticoagulation   . Chronic kidney disease    Past Surgical History:  Past Surgical History  Procedure Date  . Abdominal hysterectomy   . Appendectomy   . Tonsillectomy   . Enucleation     Right-resulted from infection    PT Assessment/Plan/Recommendation PT Assessment Clinical Impression Statement: Pt presents with a medical diagnosis of TIA along with history of stroke and dementia. Unable to determine if pt is at baseline, will speak with son and reassess. Currently recommending SNF for  safety upon d/c, will reevaluate after speaking with family. Pt will benefit from skilled PT in the acute care setting in order to improve safety prior to d/c PT Recommendation/Assessment: Patient will need skilled PT in the acute care venue PT Problem List: Decreased strength;Decreased activity tolerance;Decreased balance;Decreased mobility;Decreased safety awareness Barriers to Discharge: Other (comment) (Cognition(safety)) PT Therapy Diagnosis : Abnormality of gait PT Plan PT Frequency: Min 3X/week PT Treatment/Interventions: DME instruction;Gait training;Stair training;Functional mobility training;Therapeutic activities;Therapeutic exercise;Balance training;Neuromuscular re-education;Patient/family education PT Recommendation Follow Up Recommendations: Skilled nursing facility;Home health PT;Supervision/Assistance - 24 hour (SNF vs HH with 24 hr assist) Equipment Recommended: None recommended by PT PT Goals  Acute Rehab PT Goals PT Goal Formulation: Patient unable to participate in goal setting Time For Goal Achievement: 7 days Pt will go Supine/Side to Sit: with modified independence PT Goal: Supine/Side to Sit - Progress: Goal set today Pt will go Sit to Supine/Side: with modified independence PT Goal: Sit to Supine/Side - Progress: Goal set today Pt will go Sit to Stand: with modified independence PT Goal: Sit to Stand - Progress: Goal set today Pt will go Stand to Sit: with modified independence PT Goal: Stand to Sit - Progress: Goal set today Pt will Transfer Bed to Chair/Chair to Bed: with supervision PT Transfer Goal: Bed to Chair/Chair to Bed - Progress: Goal set today Pt will Stand: with supervision;3 - 5 min;with no upper extremity support PT Goal: Stand - Progress: Goal set today Pt will Ambulate: >150 feet;with supervision;with least restrictive assistive device PT Goal: Ambulate -  Progress: Goal set today  PT Evaluation Precautions/Restrictions   Precautions Precautions: Fall Restrictions Weight Bearing Restrictions: No Prior Functioning  Home Living Lives With: Sheran Spine Help From: Family (works during the day) Type of Home: House Home Layout: One level Home Access: Stairs to enter Entrance Stairs-Rails: None Secretary/administrator of Steps: 1 Bathroom Shower/Tub: Counselling psychologist: Yes How Accessible: Accessible via walker Prior Function Level of Independence: Other (comment) (unable to determine secondary to cognition) Cognition Cognition Arousal/Alertness: Awake/alert Overall Cognitive Status: History of cognitive impairments History of Cognitive Impairment:  (UTA) Orientation Level: Disoriented to place;Disoriented to situation;Disoriented to time;Oriented to person Cognition - Other Comments: unable to assess compared to baseline; difficulty with memory and slow to process Sensation/Coordination Sensation Light Touch: Appears Intact Coordination Gross Motor Movements are Fluid and Coordinated: Yes Extremity Assessment RLE Assessment RLE Assessment: Not tested (pt unable to follow commands for MMT; WFL) LLE Assessment LLE Assessment: Not tested (pt unable to follow commands for MMT; [facility]) Mobility (including Balance) Bed Mobility Bed Mobility: Yes Supine to Sit: 4: Min assist;With rails Supine to Sit Details (indicate cue type and reason): VC for sequencing. Min assist for trunk control into sitting Sitting - Scoot to Edge of Bed: 6: Modified independent (Device/Increase time) Transfers Transfers: Yes Sit to Stand: 4: Min assist;With upper extremity assist;From bed Sit to Stand Details (indicate cue type and reason): Min assist for stability with initiation of standing. VC for increased anterior translation Stand to Sit: 5: Supervision;With upper extremity assist;To chair/3-in-1 Stand to Sit Details: VC for hand placement for  safety Ambulation/Gait Ambulation/Gait: Yes Ambulation/Gait Assistance: 4: Min assist Ambulation/Gait Assistance Details (indicate cue type and reason): Min assist for stability during gait Ambulation Distance (Feet): 150 Feet Assistive device: 1 person hand held assist Gait Pattern: Step-to pattern;Decreased hip/knee flexion - right;Decreased hip/knee flexion - left;Decreased stride length;Lateral trunk lean to right Gait velocity: Decreased gait speed Stairs: No  Balance Balance Assessed: Yes Static Standing Balance Static Standing - Balance Support: No upper extremity supported Static Standing - Level of Assistance: 4: Min assist Static Standing - Comment/# of Minutes: Min assist for stability with feet together. Exercise    End of Session PT - End of Session Equipment Utilized During Treatment: Gait belt Activity Tolerance: Patient tolerated treatment well Patient left: in chair;with call bell in reach Nurse Communication: Mobility status for transfers;Mobility status for ambulation General Behavior During Session: [facility] for tasks performed Cognition: Impaired, at baseline  Milana Kidney 11/19/2011, 12:39 PM  11/19/2011 Milana Kidney DPT PAGER: [phone] OFFICE: [phone]

## 2011-11-19 NOTE — Progress Notes (Signed)
ANTICOAGULATION CONSULT NOTE - Initial Consult  Pharmacy Consult for Coumadin Indication: atrial fibrillation  Allergies  Allergen Reactions  . Sulfa Antibiotics Other (See Comments)    uknown  . Sulfonamide Derivatives Other (See Comments)    unknown    Patient Measurements: Height: 5\' 3"  (160 cm) Weight: 128 lb 12 oz (58.4 kg) IBW/kg (Calculated) : 52.4   Vital Signs: Temp: 97.7 F (36.5 C) (02/16 0200) Temp src: Oral (02/16 0200) BP: 93/67 mmHg (02/16 0200) Pulse Rate: 60  (02/16 0200)  Labs:  Basename 11/18/11 2216 11/18/11 1506 11/18/11 1459  HGB -- 14.8 16.0*  HCT -- 46.5* 47.0*  PLT -- 181 --  APTT -- 27 --  LABPROT -- 18.5* --  INR -- 1.51* --  HEPARINUNFRC -- -- --  CREATININE -- -- 1.20*  CKTOTAL 30 -- --  CKMB 1.9 -- --  TROPONINI <0.30 -- --   Estimated Creatinine Clearance: 26.8 ml/min (by C-G formula based on Cr of 1.2).  Medical History: Past Medical History  Diagnosis Date  . ASCVD (arteriosclerotic cardiovascular disease)     acute MI in 1993;40%left main,80%LAD, 100%RCA-PTCA; EF of 40%;stress nuclear in2007; normal EF;  mixed inferolateral ischemia and infarction; CHF with normal EF in 2011  . CHF (congestive heart failure)     normal EF;mixed inferolateral ischemic and infarction;chf with normal EF in 2011  . Hypertension   . Hyperlipidemia   . Cerebrovascular disease     CVA by CT in 2007,but not noted in 2009;duplex in 12/2006 plaque without stenosis; Emergency Room evaluation in 12/2010 for transient aphasia and paresis-clopidogrel initially added to ASA Rx, but subsequently warfarin was substituted for these 2 agents  . Tobacco abuse   . Claudication   . Dementia     mild  . Kyphosis     severe  . Decreased hearing   . Atrial fibrillation   . Chronic anticoagulation   . Chronic kidney disease     Medications:  Prescriptions prior to admission  Medication Sig Dispense Refill  . albuterol-ipratropium (COMBIVENT) 18-103 MCG/ACT  inhaler Inhale 2 puffs into the lungs every 6 (six) hours as needed. For shortness of breath      . alendronate (FOSAMAX) 70 MG tablet Take 70 mg by mouth every 7 (seven) days. Take with a full glass of water on an empty stomach.       . Ascorbic Acid (VITAMIN C) 500 MG tablet Take 500 mg by mouth daily.        . cetirizine (ZYRTEC) 10 MG tablet Take 10 mg by mouth daily.        . Cholecalciferol (VITAMIN D) 1000 UNITS capsule Take 1,000 Units by mouth daily.        . clobetasol (OLUX) 0.05 % topical foam Apply 1 application topically 2 (two) times daily as needed. For psoriasis      . Dietary Management Product (AXONA) packet Take 40 g by mouth daily.        Marland Kitchen donepezil (ARICEPT) 5 MG tablet Take 5 mg by mouth at bedtime.       . furosemide (LASIX) 40 MG tablet Take 40 mg by mouth daily.      Marland Kitchen LORazepam (ATIVAN) 0.5 MG tablet Take 0.5 mg by mouth every 8 (eight) hours. 1/4 tab am 1/2 tab pm       . memantine (NAMENDA) 10 MG tablet Take 10 mg by mouth 2 (two) times daily.       . Multiple Vitamins-Minerals (MULTIVITAMIN  WITH MINERALS) tablet Take 1 tablet by mouth daily.        . nitroGLYCERIN (NITROSTAT) 0.4 MG SL tablet Place 0.4 mg under the tongue every 5 (five) minutes as needed. For chest pain      . Omega-3 Fatty Acids (FISH OIL) 1000 MG CAPS Take 1 capsule by mouth 2 (two) times daily.       Marland Kitchen POLYETHYLENE GLYCOL 3350 PO Take 1 Units by mouth daily.        . potassium chloride SA (K-DUR,KLOR-CON) 20 MEQ tablet Take 20 mEq by mouth daily.        . pravastatin (PRAVACHOL) 40 MG tablet Take 40 mg by mouth daily.      Marland Kitchen warfarin (COUMADIN) 2.5 MG tablet Take 2.5 mg by mouth daily. TAKE 1 TABLET (2.5 MG TOTAL) BY MOUTH DAILY. TAKE 1 TABLET DAILY EXCEPT 1/2 TABLET ON T,TH,SAT      . Salicylic Acid-Cleanser (SALEX) 6 % (LOTION) KIT Apply topically.         Scheduled:    . aspirin  325 mg Oral Daily  . cefTRIAXone (ROCEPHIN) IVPB 1 gram/50 mL D5W  1 g Intravenous Q24H  . donepezil  5 mg Oral  QHS  . haloperidol lactate  5 mg Intravenous Once  . LORazepam  1 mg Oral Once  . memantine  10 mg Oral BID  . simvastatin  20 mg Oral q1800  . warfarin  5 mg Oral ONCE-1800    Assessment: 78yo female admitted for TIA with subtherapeutic INR for Afib, had also been subtherapeutic at last outpt anticoag visit 1/31 with plans to boost; missed dose for 2/15.  Goal of Therapy:  INR 2-3   Plan:  Will give boosted Coumadin dose of 5mg  x1 today and adjust per INR.  Home regimen = 2.5mg  MWFSun and 1.25mg  TTSat.  Colleen Can PharmD BCPS 11/19/2011,3:34 AM

## 2011-11-19 NOTE — Progress Notes (Signed)
Stroke Team Progress Note  HISTORY Angela [name] is an 78 y.o. female who had sudden onset confusion with right facial droop and right hemiparesis that started at 2:00 pm. Not t-PA candidate due to NIHSS of 3 on arrival to ED. All deficits believed to be attributed to pre-existing dementia and eye surgery with eye prosthesis in right eye. Symptoms have resolved on arrival to ED.   LSN: 14:00 (11/18/11) tPA Given: No: low NIHSS, deficits believed to be pre-existing  mRankin: 4  SUBJECTIVE Pt very sleepy this am.  Had agitation over night; pulled IV out and required Ativan 1 mg @ 2037 and Haldol 5 mg IV @ 0354.  Son at bedside; states she is not normally this confused or agitated at home, however, she receives Ativan 0.25 mg PO BID-TID at home.  He is interested in changing her to Haldol as it seemed to work better for her here.  Is on Coumadin at home; INR subtherapeutic 1.51 on admit.  Son reports patient does not have pacemaker.  Followed by [facility] in [place].  OBJECTIVE Most recent Vital Signs: Temp: 97.9 F (36.6 C) (02/16 0600) Temp src: Oral (02/16 0600) BP: 98/68 mmHg (02/16 0600) Pulse Rate: 62  (02/16 0600) Respiratory Rate: 19 O2 Saturation: 95%  CBG (last 3)  Basename 11/19/11 0710  GLUCAP 85   Intake/Output from previous day: 02/15 0701 - 02/16 0700 In: 1812.5 [I.V.:1762.5; IV Piggyback:50] Out: -   IV Fluid Intake:     . sodium chloride 125 mL/hr at 11/19/11 0359   Medications:    . aspirin  325 mg Oral Daily  . cefTRIAXone (ROCEPHIN) IVPB 1 gram/50 mL D5W  1 g Intravenous Q24H  . donepezil  5 mg Oral QHS  . haloperidol lactate  5 mg Intravenous Once  . LORazepam  1 mg Oral Once  . memantine  10 mg Oral BID  . simvastatin  20 mg Oral q1800  . warfarin  5 mg Oral ONCE-1800  PRN acetaminophen, albuterol-ipratropium, LORazepam, nitroGLYCERIN, polyethylene glycol  Diet:  Heart healthy, thin Activity: Bed rest, fall precautions. DVT  Prophylaxis: SCD's  Mental Status: Awake cooperative.Diminished attention, registration and recall  Able to follow 1 step commands, multi-step requires cuing.  Speech and thought content difficult to assess as attention is poor. Cranial Nerves: II- Visual fields grossly intact OS.  (Glass OD) III/IV/VI-Extraocular movements intact.  Pupil OS reactive bilaterally. V/VII-Smile symmetric VIII-grossly intact XI-bilateral shoulder shrug XII-midline tongue extension Motor:  Variable effort due to comprehension difficulty, R grip seems a little weaker than the left. Sensory: light touch intact throughout, bilaterally Deep Tendon Reflexes: 2+UEs, 1+ LE's and symmetric throughout Plantars: Downgoing bilaterally Cerebellar: Normal finger-to-nose, normal rapid alternating movements.   Labs: POCT I-STAT, CHEM 8     Status: Abnormal   11/18/11  2:59 PM      Component Value Range Comment   Sodium 143  135 - 145 (mEq/L)    Potassium 4.7  3.5 - 5.1 (mEq/L)    Chloride 102  96 - 112 (mEq/L)    BUN 14  6 - 23 (mg/dL)    Creatinine, Ser 4.09 (*) 0.50 - 1.10 (mg/dL)    Glucose, Bld 811 (*) 70 - 99 (mg/dL)    Calcium, Ion 9.14  1.12 - 1.32 (mmol/L)   CBC     Status: Abnormal   11/18/11  3:06 PM      Component Value Range Comment   WBC 9.2  4.0 - 10.5 (K/uL) WHITE  COUNT CONFIRMED ON SMEAR   RBC 4.90  3.87 - 5.11 (MIL/uL)    Hemoglobin 14.8  12.0 - 15.0 (g/dL)    HCT 40.9 (*) 81.1 - 46.0 (%)    MCV 94.9  78.0 - 100.0 (fL)    MCH 30.2  26.0 - 34.0 (pg)    MCHC 31.8  30.0 - 36.0 (g/dL)    RDW 91.4  78.2 - 95.6 (%)    Platelets 181  150 - 400 (K/uL)   URINALYSIS, ROUTINE W REFLEX MICROSCOPIC     Status: Abnormal   11/18/11  5:38 PM   Color, Urine YELLOW  YELLOW     APPearance HAZY (*) CLEAR     Specific Gravity, Urine 1.009  1.005 - 1.030     pH 7.0  5.0 - 8.0     Glucose, UA NEGATIVE  NEGATIVE (mg/dL)    Hgb urine dipstick TRACE (*) NEGATIVE     Bilirubin Urine NEGATIVE  NEGATIVE     Ketones, ur  NEGATIVE  NEGATIVE (mg/dL)    Protein, ur NEGATIVE  NEGATIVE (mg/dL)    Urobilinogen, UA 0.2  0.0 - 1.0 (mg/dL)    Nitrite NEGATIVE  NEGATIVE     Leukocytes, UA LARGE (*) NEGATIVE    URINE MICROSCOPIC-ADD ON     Status: Abnormal   11/18/11  5:38 PM      Component Value Range Comment   Squamous Epithelial / LPF MANY (*) RARE     WBC, UA 21-50  <3 (WBC/hpf)    RBC / HPF 0-2  <3 (RBC/hpf)    Bacteria, UA FEW (*) RARE     Urine-Other MUCOUS PRESENT     GLUCOSE, CAPILLARY     Status: Normal   11/19/11  7:10 AM   Glucose-Capillary 85  70 - 99 (mg/dL)     COAGS Lab Results  Component Value Date   INR 1.51* 11/18/2011   INR 1.8 11/03/2011   INR 1.6 10/20/2011   Lipid Panel    Component Value Date/Time   CHOL 112 11/19/2011 0600   TRIG 96 11/19/2011 0600   HDL 41 11/19/2011 0600   CHOLHDL 2.7 11/19/2011 0600   VLDL 19 11/19/2011 0600   LDLCALC 52 11/19/2011 0600   Imaging: 11/18/2011 CT HEAD WITHOUT CONTRAST  Findings: Generalized atrophy.  Chronic ischemic changes throughout the white matter are stable from the  prior study.  No acute infarct.  No acute hemorrhage or mass.  Extensive calcification is present in the interhemispheric fissure on the right extending posteriorly into the occipital lobe.  These are stable and unchanged from 03/12/2006.  These appear to be tubular  vascular calcifications.  This could be due to a vascular malformation.  No surrounding edema or hemorrhage is present.  This is unchanged from prior studies.  Right eye prosthesis is noted.  Calvarium is intact.  IMPRESSION: Atrophy and chronic ischemic change.  No acute infarct.  Tubular vascular calcifications right occipital parietal lobe are unchanged from 2007.  This may reflect underlying vascular malformation. Camelia Phenes, M.D.   MRI/MRA- ordered 2 D Echo - ordered Carotid Doppler- ordered  Telemetry- afib with frequent bigeminy  ASSESSMENT [name] is a 78 y.o. female with baseline dementia 1.  confusion and R sided weakness suggestive of left brain TIA vs small infarct not seen on CT head  Head  .  MRI/MRA pending.  2. Afib-  On chronic Coumadin therapy, however, most recent INR's have been subtherapeutic.  On ASA 325 mg and coumadin for secondary stroke prevention.  Pharmacy dosing coumadin.  Frequent ventricular ectopy. 3. HTN- controlled. 4. Hyperlipidemia- LDL @ goal: 52 5. UTI- on Rocephin 6. Dementia on Aricept and Namenda- sundowning- caution oversedation with Haldol/Ativan  Stroke risk factors:  CAD, CVD/ prior CVA, HTN, hyperlipidemia, Afib.  Hospital day # 1  TREATMENT/PLAN 1. Once INR therapeutic, would D'C ASA. 2. MRI, MRA of the brain without contrast pending 3. PT consult, OT consult, Speech consult  4. Echocardiogram pending 5. Carotid dopplers pending 6. Prophylactic therapy-Anticoagulation: Coumadin, goal INR 2-3 7. Risk factor modification  8. Cardiac monitoring- will request cards consult due to frequent ventricular ectopy. 9. DVT prophylaxis  10. Neurochecks q4h  11. IV fluids  12. MAP b/w 120-130 13. Rocephin for UTI 14.  Trial Haldol 0.5 mg PO QHS for agitation/ sundowning.  If it works, will need to taper off ativan as taken at home slowly over 3 weeks. 15. D/W son and daughter and answered questions.Delice Bison Jernejcic PA-C [facility] [phone] 11/19/2011  8:42 AM I have personally examined this patient, taken history, participated in plan of care and agree with above  Delia Heady, MD

## 2011-11-19 NOTE — Progress Notes (Signed)
Patient ID: Angela [name], female   DOB: 26-Jan-1921, 78 y.o.   MRN: [id number] CARDIOLOGY CONSULT NOTE  Patient ID: Angela [name] MRN: [id number] DOB/AGE: 04-01-1921 78 y.o.  Admit date: 11/18/2011 Referring Physician: Lynett Fish Primary Physician:  Primary Cardiologist: Foscoe Bing Reason for Consultation: CAF with CVA  HPI: Angela [name] is an 78 year old white female, patient of Dr. Dietrich Pates, who was admitted with a TIA versus a stroke.  She developed weakness after eating lunch. Her caretaker felt that she had some facial droop on the left and some weakness on the right. She's had previous TIAs in the past and strokes.  She has atherosclerotic coronary disease with an MI in 1993. She had PTCA at that time. She's done remarkably well. She has chronic atrial fibrillation and is followed in our Coumadin clinic. Her last visit she was subtherapeutic with an INR of 1.8. Adjustments were made. On admission here her INR was 1.5.  CT scan shows small vessel disease and atrophy but no acute infarct.  She also has a history of congestive heart failure. Her last echocardiogram was in October of 2011 with well-preserved systolic function , moderate MR, left atrial enlargement, and a calcified aortic valve with mild stenosis. Carotid Dopplers in March 2012 demonstrated nonobstructive disease other than reversal of flow in the right vertebral consistent with subclavian steal syndrome. These findings had not changed since her previous studies.  She suffers from significant dementia.  She offers no complaints at present time. Her daughters in the room as is her son who are helpful with the interview.  Review of systems complete and found to be negative unless listed above.   Past Medical History  Diagnosis Date  . ASCVD (arteriosclerotic cardiovascular disease)     acute MI in 1993;40%left main,80%LAD, 100%RCA-PTCA; EF of 40%;stress nuclear in2007; normal EF;  mixed inferolateral ischemia and  infarction; CHF with normal EF in 2011  . CHF (congestive heart failure)     normal EF;mixed inferolateral ischemic and infarction;chf with normal EF in 2011  . Hypertension   . Hyperlipidemia   . Cerebrovascular disease     CVA by CT in 2007,but not noted in 2009;duplex in 12/2006 plaque without stenosis; Emergency Room evaluation in 12/2010 for transient aphasia and paresis-clopidogrel initially added to ASA Rx, but subsequently warfarin was substituted for these 2 agents  . Tobacco abuse   . Claudication   . Dementia     mild  . Kyphosis     severe  . Decreased hearing   . Atrial fibrillation   . Chronic anticoagulation   . Chronic kidney disease     Family History  Problem Relation Age of Onset  . Dementia Mother   . Atrial fibrillation Son   . Hypertension Son     History   Social History  . Marital Status: Divorced    Spouse Name: N/A    Number of Children: 2  . Years of Education: N/A   Occupational History  . retired    Social History Main Topics  . Smoking status: Former Smoker    Quit date: 01/05/1994  . Smokeless tobacco: Never Used  . Alcohol Use: No  . Drug Use: No  . Sexually Active:    Other Topics Concern  . Not on file   Social History Narrative  . No narrative on file    Past Surgical History  Procedure Date  . Abdominal hysterectomy   . Appendectomy   . Tonsillectomy   .  Enucleation     Right-resulted from infection     Prescriptions prior to admission  Medication Sig Dispense Refill  . albuterol-ipratropium (COMBIVENT) 18-103 MCG/ACT inhaler Inhale 2 puffs into the lungs every 6 (six) hours as needed. For shortness of breath      . alendronate (FOSAMAX) 70 MG tablet Take 70 mg by mouth every 7 (seven) days. Take with a full glass of water on an empty stomach.       . Ascorbic Acid (VITAMIN C) 500 MG tablet Take 500 mg by mouth daily.        . cetirizine (ZYRTEC) 10 MG tablet Take 10 mg by mouth daily.        . Cholecalciferol (VITAMIN  D) 1000 UNITS capsule Take 1,000 Units by mouth daily.        . clobetasol (OLUX) 0.05 % topical foam Apply 1 application topically 2 (two) times daily as needed. For psoriasis      . Dietary Management Product (AXONA) packet Take 40 g by mouth daily.        Marland Kitchen donepezil (ARICEPT) 5 MG tablet Take 5 mg by mouth at bedtime.       . furosemide (LASIX) 40 MG tablet Take 40 mg by mouth daily.      Marland Kitchen LORazepam (ATIVAN) 0.5 MG tablet Take 0.5 mg by mouth every 8 (eight) hours. 1/4 tab am 1/2 tab pm       . memantine (NAMENDA) 10 MG tablet Take 10 mg by mouth 2 (two) times daily.       . Multiple Vitamins-Minerals (MULTIVITAMIN WITH MINERALS) tablet Take 1 tablet by mouth daily.        . nitroGLYCERIN (NITROSTAT) 0.4 MG SL tablet Place 0.4 mg under the tongue every 5 (five) minutes as needed. For chest pain      . Omega-3 Fatty Acids (FISH OIL) 1000 MG CAPS Take 1 capsule by mouth 2 (two) times daily.       Marland Kitchen POLYETHYLENE GLYCOL 3350 PO Take 1 Units by mouth daily.        . potassium chloride SA (K-DUR,KLOR-CON) 20 MEQ tablet Take 20 mEq by mouth daily.        . pravastatin (PRAVACHOL) 40 MG tablet Take 40 mg by mouth daily.      Marland Kitchen warfarin (COUMADIN) 2.5 MG tablet Take 2.5 mg by mouth daily. TAKE 1 TABLET (2.5 MG TOTAL) BY MOUTH DAILY. TAKE 1 TABLET DAILY EXCEPT 1/2 TABLET ON T,TH,SAT      . Salicylic Acid-Cleanser (SALEX) 6 % (LOTION) KIT Apply topically.           Intake/Output previous 24 hours: 02/15 0701 - 02/16 0700 In: 1812.5 [I.V.:1762.5; IV Piggyback:50] Out: -  Intake/Output previous shift: Total I/O In: 660 [P.O.:660] Out: -   Physical Exam: Vitals:  Blood pressure 120/80, pulse 72, temperature 97.8 F (36.6 C), temperature source Oral, resp. rate 20, height 5\' 3"  (1.6 m), weight 58.4 kg (128 lb 12 oz), SpO2 93.00%. BMI:  Body mass index is 22.81 kg/(m^2). Frail lady sitting in the bed in no acute distress. She is pleasantly demented. Partially engaged in conversation. She has a  symmetrical face, glass eye. Neck shows no JVD, no thyromegaly or, and no carotid bruits. Lungs clear to auscultation and percussion. She severely kyphotic. Heart reveals a regular rate and rhythm. Abdominal exam soft bowel sounds present. Extremities decreased pulses with trace edema. Neuro exam pleasantly demented. No true focal abnormalities.    Labs:  Lab Results  Component Value Date   WBC 9.2 11/18/2011   HGB 14.8 11/18/2011   HCT 46.5* 11/18/2011   MCV 94.9 11/18/2011   PLT 181 11/18/2011    Lab 11/18/11 1459  NA 143  K 4.7  CL 102  CO2 --  BUN 14  CREATININE 1.20*  CALCIUM --  PROT --  BILITOT --  ALKPHOS --  ALT --  AST --  GLUCOSE 104*   Lab Results  Component Value Date   CKTOTAL 37 11/19/2011   CKMB 2.4 11/19/2011   TROPONINI <0.30 11/19/2011    Lab Results  Component Value Date   CHOL 112 11/19/2011   CHOL 149 03/12/2011   Lab Results  Component Value Date   HDL 41 11/19/2011   HDL 39* 03/12/2011   Lab Results  Component Value Date   LDLCALC 52 11/19/2011   LDLCALC 78 03/12/2011   Lab Results  Component Value Date   TRIG 96 11/19/2011   TRIG 160* 03/12/2011   Lab Results  Component Value Date   CHOLHDL 2.7 11/19/2011   CHOLHDL 3.8 03/12/2011   No results found for this basename: LDLDIRECT    @RESUBNP @ Lab Results  Component Value Date   TSH 3.999 03/12/2011   BNP: No components found with this basename: POCBNP:3 D-Dimer: No results found for this basename: DDIMER:2 in the last 72 hours Hemoglobin A1C:  Basename 11/18/11 2217  HGBA1C 5.2    Lab 11/18/11 1506  APTT 27  INR 1.51*  PTT --       Radiology: Ct Head Wo Contrast  11/18/2011  *RADIOLOGY REPORT*  Clinical Data: Code stroke.  Sudden onset right-sided weakness  CT HEAD WITHOUT CONTRAST  Technique:  Contiguous axial images were obtained from the base of the skull through the vertex without contrast.  Comparison: CT 12/11/2010  Findings: Generalized atrophy.  Chronic ischemic changes  throughout the white matter are stable from the  prior study.  No acute infarct.  No acute hemorrhage or mass.  Extensive calcification is present in the interhemispheric fissure on the right extending posteriorly into the occipital lobe.  These are stable and unchanged from 03/12/2006.  These appear to be tubular  vascular calcifications.  This could be due to a vascular malformation.  No surrounding edema or hemorrhage is present.  This is unchanged from prior studies.  Right eye prosthesis is noted.  Calvarium is intact.  IMPRESSION: Atrophy and chronic ischemic change.  No acute infarct.  Tubular vascular calcifications right occipital parietal lobe are unchanged from 2007.  This may reflect underlying vascular malformation.  These results were called by telephone on 11/18/2011  at  1515 hours to  Dr. Christianne Borrow, who verbally acknowledged these results.  Original Report Authenticated By: Camelia Phenes, M.D.   EKG: Chronic A. fib, old right bundle branch block, PVCs  ASSESSMENT AND PLAN:  Angela.Daisey is a pleasant elderly demented white female with chronic A. fib who presents with a TIA. She's had several bees in the past. This is most likely secondary to her chronic A. fib and subtherapeutic on INR. CT shows small vessel disease and atrophy with no acute infarct. MRI has been ordered.  Her coronary disease is stable. She has relatively good left ventricular systolic function. She is not a candidate for any aggressive intervention. I discussed this with the family.  I would simply get her on or within the therapeutic range and discharge her home. She has a caretaker they are very engaged family.  Fall risk is still fairly high. We'll arrange followup in the Coumadin clinic and [place].  I see no value to obtain carotid Dopplers ordered echocardiogram. Is not going to change what we do for her clinically. I have taken the liberty to cancel these. I discussed with the family.  Valera Castle, MD  11/19/2011,  2:15 PM

## 2011-11-20 MED ORDER — LEVOFLOXACIN 500 MG PO TABS: 500.0000 mg | ORAL_TABLET | Freq: Every day | ORAL | Status: AC

## 2011-11-20 MED ORDER — LORAZEPAM 0.5 MG PO TABS: 0.2500 mg | ORAL_TABLET | Freq: Three times a day (TID) | ORAL | Status: DC

## 2011-11-20 MED ORDER — HALOPERIDOL 0.5 MG PO TABS: 0.5000 mg | ORAL_TABLET | Freq: Every day | ORAL | Status: DC

## 2011-11-20 MED ORDER — FUROSEMIDE 40 MG PO TABS: 20.0000 mg | ORAL_TABLET | Freq: Every day | ORAL | Status: DC

## 2011-11-20 NOTE — Progress Notes (Signed)
Stroke Team Progress Note  HISTORY Angela [name] is an 78 y.o. female who had sudden onset confusion with right facial droop and right hemiparesis that started at 2:00 pm. Not t-PA candidate due to NIHSS of 3 on arrival to ED. All deficits believed to be attributed to pre-existing dementia and eye surgery with eye prosthesis in right eye. Symptoms have resolved on arrival to ED.   LSN: 14:00 (11/18/11) tPA Given: No: low NIHSS, deficits believed to be pre-existing  mRankin: 4  SUBJECTIVE More alert this am.  Conversant, pleasantly demented.  Son reports patient had a better night sleep last night.   Dr. Daleen Squibb with [facility] saw patient in consult yesterday; Her coronary disease is stable and she has relatively good left ventricular systolic function. She is not a candidate for any aggressive intervention.  He recommended getting her on or within the therapeutic INR range and discharging her home with follow up in [place].   He saw no value in extensive stroke work up as it would not change what was done for her clinically.  He discussed this with the family and they agreed.  OBJECTIVE Most recent Vital Signs: Temp: 97.5 F (36.4 C) (02/17 0600) BP: 110/78 mmHg (02/17 0600) Pulse Rate: 72  (02/17 0600) Respiratory Rate: 16 O2 Saturation: 94%  CBG (last 3)   Basename 11/19/11 1621 11/19/11 1231 11/19/11 0710  GLUCAP 107* 86 85   Intake/Output from previous day: 02/16 0701 - 02/17 0700 In: 660 [P.O.:660] Out: 1 [Stool:1]  IV Fluid Intake:      . sodium chloride 125 mL/hr at 11/20/11 0700   Medications:     . aspirin  325 mg Oral Daily  . cefTRIAXone (ROCEPHIN) IVPB 1 gram/50 mL D5W  1 g Intravenous Q24H  . donepezil  5 mg Oral QHS  . haloperidol  0.5 mg Oral QHS  . memantine  10 mg Oral BID  . simvastatin  20 mg Oral q1800  . warfarin  5 mg Oral ONCE-1800  PRN acetaminophen, albuterol-ipratropium, LORazepam, nitroGLYCERIN, polyethylene glycol  Diet:  Heart healthy,  thin Activity: Bed rest, fall precautions. DVT Prophylaxis: SCD's  Mental Status: Awake,  Cooperative. Improved  attention, diminished registration and recall persists.  Able to follow 1 step commands, multi-step requires cuing.  Speech and thought content more appropriate this am.  Cranial Nerves: II- Visual fields grossly intact OS.  (Glass OD) III/IV/VI-Extraocular movements intact.  Pupil OS reactive bilaterally. V/VII-Smile symmetric VIII-grossly intact XI-bilateral shoulder shrug XII-midline tongue extension Motor:  Variable effort due to comprehension difficulty, R grip seems a little weaker than the left. Sensory: light touch intact throughout, bilaterally Deep Tendon Reflexes: 2+UEs, 1+ LE's and symmetric throughout Plantars: Downgoing bilaterally Cerebellar: Normal finger-to-nose, normal rapid alternating movements.   Labs:  COAGS Lab Results  Component Value Date   INR 2.04* 11/20/2011   INR 1.51* 11/18/2011   INR 1.8 11/03/2011   Lipid Panel    Component Value Date/Time   CHOL 112 11/19/2011 0600   TRIG 96 11/19/2011 0600   HDL 41 11/19/2011 0600   CHOLHDL 2.7 11/19/2011 0600   VLDL 19 11/19/2011 0600   LDLCALC 52 11/19/2011 0600   Imaging: 11/18/2011 CT HEAD WITHOUT CONTRAST  Findings: Generalized atrophy.  Chronic ischemic changes throughout the white matter are stable from the  prior study.  No acute infarct.  No acute hemorrhage or mass.  Extensive calcification is present in the interhemispheric fissure on the right extending posteriorly into the occipital lobe.  These are stable and unchanged from 03/12/2006.  These appear to be tubular  vascular calcifications.  This could be due to a vascular malformation.  No surrounding edema or hemorrhage is present.  This is unchanged from prior studies.  Right eye prosthesis is noted.  Calvarium is intact.  IMPRESSION: Atrophy and chronic ischemic change.  No acute infarct.  Tubular vascular calcifications right occipital  parietal lobe are unchanged from 2007.  This may reflect underlying vascular malformation. Camelia Phenes, M.D.   11/20/11  MRI HEAD WITHOUT CONTRASTFindings: Present examination is limited to four sequences. The patient refused further imaging. No acute infarct. Remote right parietal lobe infarct with encephalomalacia and dilation of the ventricle extending into this region. Peripheral calcifications without findings to suggest arteriovenous malformation as questioned on CT. Global atrophy. Moderate small vessel disease type changes. No intracranial mass lesion detected on this unenhanced motion degraded limit exam. Right orbital exoneration. Small right vertebral artery with atherosclerotic type changes. Ectatic left vertebral artery and basilar artery with atherosclerotic type changes. Ectatic carotid arteries with atherosclerotic type changes. IMPRESSION: No acute infarct.  Remote right parietal lobe infarct. Small vessel disease type changes. Global atrophy.  Atherosclerotic type changes intracranial vasculature. : Fuller Canada, M.D.  Telemetry- afib with frequent bigeminy  ASSESSMENT Angela [name] is a 78 y.o. female with baseline dementia 1. confusion and R sided weakness suggestive of left brain TIA vs small infarct not seen on CT head  Head  .  MRI head negative for acute infarct.  No aggressive w/u per cards and family. 2. Afib-  On chronic Coumadin therapeutic @.04 today.  D/C ASA. 3. HTN- controlled. 4. Hyperlipidemia- LDL @ goal: 52 5. UTI- on Rocephin 6. Dementia on Aricept and Namenda- sundowning- caution oversedation with Haldol/Ativan  Stroke risk factors:  CAD, CVD/ prior CVA, HTN, hyperlipidemia, Afib.  Hospital day # 2  TREATMENT/PLAN 1.INR therapeutic,  D'C ASA. 2.Prophylactic therapy-Anticoagulation: Coumadin, goal INR 2-3  No further neurologic intervention is recommended at this time.  If further questions arise, please call or page at that time.  Thank you for  allowing neurology to participate in the care of this patient.   Marya Fossa PA-C [facility] [phone] 11/20/2011  8:28 AM  I have personally examined this patient, taken history, participated in plan of care and agree with above  Delia Heady, MD

## 2011-11-20 NOTE — Discharge Summary (Signed)
Physician Discharge Summary  Patient ID: Angela [name] MRN: [id number] DOB/AGE: [birth year] 78 y.o.  Admit date: 11/18/2011 Discharge date: 11/20/2011  Primary Care Physician:  Cassell Smiles., MD, MD   Discharge Diagnoses:   1-TIA (transient ischemic attack) 2-UTI (lower urinary tract infection) 3-Hypotension 4-DEMENTIA (with agitation) 5-Atrial fibrillation 6-ASCVD (arteriosclerotic cardiovascular disease) 7-HTN 8-HLD 9-Osteoporosis    Medication List  As of 11/20/2011  9:45 AM   TAKE these medications         alendronate 70 MG tablet   Commonly known as: FOSAMAX   Take 70 mg by mouth every 7 (seven) days. Take with a full glass of water on an empty stomach.      AXONA packet   Take 40 g by mouth daily.      cetirizine 10 MG tablet   Commonly known as: ZYRTEC   Take 10 mg by mouth daily.      clobetasol 0.05 % topical foam   Commonly known as: OLUX   Apply 1 application topically 2 (two) times daily as needed. For psoriasis      COMBIVENT 18-103 MCG/ACT inhaler   Generic drug: albuterol-ipratropium   Inhale 2 puffs into the lungs every 6 (six) hours as needed. For shortness of breath      donepezil 5 MG tablet   Commonly known as: ARICEPT   Take 5 mg by mouth at bedtime.      Fish Oil 1000 MG Caps   Take 1 capsule by mouth 2 (two) times daily.      furosemide 40 MG tablet   Commonly known as: LASIX   Take 0.5 tablets (20 mg total) by mouth daily.      haloperidol 0.5 MG tablet   Commonly known as: HALDOL   Take 1 tablet (0.5 mg total) by mouth at bedtime.      levofloxacin 500 MG tablet   Commonly known as: LEVAQUIN   Take 1 tablet (500 mg total) by mouth daily.   Start taking on: 11/21/2011      LORazepam 0.5 MG tablet   Commonly known as: ATIVAN   Take 0.5 tablets (0.25 mg total) by mouth every 8 (eight) hours.      memantine 10 MG tablet   Commonly known as: NAMENDA   Take 10 mg by mouth 2 (two) times daily.      multivitamin with minerals  tablet   Take 1 tablet by mouth daily.      nitroGLYCERIN 0.4 MG SL tablet   Commonly known as: NITROSTAT   Place 0.4 mg under the tongue every 5 (five) minutes as needed. For chest pain      POLYETHYLENE GLYCOL 3350 PO   Take 1 Units by mouth daily.      potassium chloride SA 20 MEQ tablet   Commonly known as: K-DUR,KLOR-CON   Take 20 mEq by mouth daily.      pravastatin 40 MG tablet   Commonly known as: PRAVACHOL   Take 40 mg by mouth daily.      Salex 6 % (LOTION) Kit   Apply topically.      vitamin C 500 MG tablet   Commonly known as: ASCORBIC ACID   Take 500 mg by mouth daily.      Vitamin D 1000 UNITS capsule   Take 1,000 Units by mouth daily.      warfarin 2.5 MG tablet   Commonly known as: COUMADIN   Take 2.5 mg by mouth daily. TAKE 1 TABLET (  2.5 MG TOTAL) BY MOUTH DAILY. TAKE 1 TABLET DAILY EXCEPT 1/2 TABLET ON T,TH,SAT             Disposition and Follow-up:  Discharge in stable and improved condition. Patient INR is therapeutic at discharge and she will follo with coumadin clinic in 2-3 days for further coumadin adjustments; especially now she has been discharge on Antibiotics. Patient MRI negative for acute stroke; leaving most likely cause of her symptoms to be 2/2 TME due to UTI and/or decrease perfusion due to transient hypotension. Lasix has been adjusted and patient advised to keep herself hydrated. HHPT has been arranged for deconditioning.  Consults:   Cardiology Neurology   Significant Diagnostic Studies:  Ct Head Wo Contrast  11/18/2011  *RADIOLOGY REPORT*  Clinical Data: Code stroke.  Sudden onset right-sided weakness  CT HEAD WITHOUT CONTRAST  Technique:  Contiguous axial images were obtained from the base of the skull through the vertex without contrast.  Comparison: CT 12/11/2010  Findings: Generalized atrophy.  Chronic ischemic changes throughout the white matter are stable from the  prior study.  No acute infarct.  No acute hemorrhage or mass.   Extensive calcification is present in the interhemispheric fissure on the right extending posteriorly into the occipital lobe.  These are stable and unchanged from 03/12/2006.  These appear to be tubular  vascular calcifications.  This could be due to a vascular malformation.  No surrounding edema or hemorrhage is present.  This is unchanged from prior studies.  Right eye prosthesis is noted.  Calvarium is intact.  IMPRESSION: Atrophy and chronic ischemic change.  No acute infarct.  Tubular vascular calcifications right occipital parietal lobe are unchanged from 2007.  This may reflect underlying vascular malformation.  These results were called by telephone on 11/18/2011  at  1515 hours to  Dr. Christianne Borrow, who verbally acknowledged these results.  Original Report Authenticated By: Camelia Phenes, M.D.    Brief H and P: 78 y.o. female has a past medical history of ASCVD (arteriosclerotic cardiovascular disease); CHF (congestive heart failure); Hypertension; Hyperlipidemia; Cerebrovascular disease; Tobacco abuse; Claudication; Dementia; Kyphosis; Decreased hearing; Atrial fibrillation; and Chronic anticoagulation. Presented with episode of slurred speech and leg weakness, and also possible fascial droop of left side. Symptoms resolved by the time she arrived to the ED. MRI without acute infarct. Admitted for further evaluation and tx of her TIA. Of note UA with findings suggesting UTI.  Hospital Course:  1-TIA (transient ischemic attack): most likely 2/2 to transient hypotension vs UTI. Patient will be treated with levaquin for 5 more days and after fluid resuscitation no orthostasis or further low BP. She will continue taking coumadin for secondary prevention.  2-UTI (lower urinary tract infection): Levaquin for 5 more days to complete a 7 days antibiotics total. At this point point no dysuria or fever; mental status at baseline.  3-A. Fibrillation: continue coumadin; Rate control.  4-Hypotension: lasix  dose cut in half; no signs of fluid overload. Patient will follow with PCP in 10 days for further medication adjustments.  5-DEMENTIA (with agitation): continue Namenda, Aricept and supportive care. For agitation patient has been started on haldol; and will be better is she is weaned off Benzodiazepines.   6-ASCVD (arteriosclerotic cardiovascular disease): stable; continue current medication regimen and heart healthy diet.  7-HTN: well controlled. With transient episode of hypotension. Lasix adjusted to 20mg  daily.  8-HLD: continue statins.  9-Osteoporosis: continue alendronate and daily calcium + Vit D.  Time spent on Discharge: 40 minutes  Signed: Leana Springston 11/20/2011, 9:45 AM

## 2011-11-20 NOTE — Progress Notes (Signed)
Transported to family car via wheelchair 

## 2011-11-21 NOTE — Progress Notes (Signed)
   CARE MANAGEMENT NOTE 11/21/2011  Patient:  Angela [name], Angela [name]   Account Number:  [id number]  Date Initiated:  11/21/2011  Documentation initiated by:  [employer]  Subjective/Objective Assessment:   TIA, UTI     Action/Plan:   lives at home with son, daugther also assist with care. Pt has pca in home while son is working.   Anticipated DC Date:  11/20/2011   Anticipated DC Plan:  HOME W HOME HEALTH SERVICES      DC Planning Services  CM consult      [facility] Choice  HOME HEALTH   Choice offered to / List presented to:  C-4 Adult Children        HH arranged  HH-2 PT      [facility] agency  [facility]   Status of service:  Completed, signed off Medicare Important Message given?   (If response is "NO", the following Medicare IM given date fields will be blank) Date Medicare IM given:   Date Additional Medicare IM given:    Discharge Disposition:  HOME W HOME HEALTH SERVICES  Per UR Regulation:    Comments:  11/21/2011 0930 Contacted pt and PCA states that son and daughter are his primary caregivers. Son want be home until after 3 pm. Pt lives at home with son. Daughter, Sabino Dick [phone]. Contacted dtr and offered choice for HH. Dtr decided with [facility] for [facility] PT.  Gave daughter number for [facility].  Referral entered in [facility] with clinicals. Isidoro Donning RN CCM Case Mgmt phone [phone]

## 2011-11-21 NOTE — Progress Notes (Signed)
Utilization Review Completed.Angela [name] T2/18/2013   

## 2011-11-24 ENCOUNTER — Ambulatory Visit (INDEPENDENT_AMBULATORY_CARE_PROVIDER_SITE_OTHER): Payer: PRIVATE HEALTH INSURANCE | Admitting: *Deleted

## 2011-11-24 DIAGNOSIS — I4891 Unspecified atrial fibrillation: Secondary | ICD-10-CM

## 2011-11-24 DIAGNOSIS — Z7901 Long term (current) use of anticoagulants: Secondary | ICD-10-CM

## 2011-11-24 LAB — POCT INR: INR: 2.8

## 2011-11-28 ENCOUNTER — Encounter: Payer: Self-pay | Admitting: Adult Health

## 2011-11-28 ENCOUNTER — Ambulatory Visit (INDEPENDENT_AMBULATORY_CARE_PROVIDER_SITE_OTHER): Payer: PRIVATE HEALTH INSURANCE | Admitting: Adult Health

## 2011-11-28 DIAGNOSIS — I4891 Unspecified atrial fibrillation: Secondary | ICD-10-CM

## 2011-11-28 DIAGNOSIS — I10 Essential (primary) hypertension: Secondary | ICD-10-CM

## 2011-11-28 DIAGNOSIS — G459 Transient cerebral ischemic attack, unspecified: Secondary | ICD-10-CM

## 2011-11-28 MED ORDER — LISINOPRIL 2.5 MG PO TABS: 2.5000 mg | ORAL_TABLET | Freq: Every day | ORAL | Status: DC

## 2011-11-28 NOTE — Patient Instructions (Signed)
**Note De-Identified  Obfuscation** Your physician has recommended you make the following change in your medication: Lisinopril 2.5 mg daily  Your physician recommends that you schedule a follow-up appointment in: 1 week for a BP check with nurse and in 3 months with provider

## 2011-11-28 NOTE — Progress Notes (Signed)
HPI: Angela [name] is a 78 y/o patient of Dr. Dietrich Pates with multiple medical problems that we are seeing for ongoing assessment and treatment of atrial fibrillation on coumadin, hypertension, CVA, TIA, and hypercholesterolemia. She was recently hospitalized for TIA and UTI. She was found to have slurred speech and a right sided facial droop. Carotid studies showed mild stenosis.Lasix was decreased to 20 mg daily on discharge secondary to hypotension, but her son went up on the dose to her previous 40 mg a day because of wt gain and LEE. Most recent echo demonstrated normal EF. She has dementia and has no recollection of her recent admission. Her son , who is with her, is her main caretaker.  Allergies  Allergen Reactions  . Haldol (Haloperidol Decanoate)     Over sedation  . Sulfa Antibiotics Other (See Comments)    uknown  . Sulfonamide Derivatives Other (See Comments)    unknown    Current Outpatient Prescriptions  Medication Sig Dispense Refill  . albuterol-ipratropium (COMBIVENT) 18-103 MCG/ACT inhaler Inhale 2 puffs into the lungs every 6 (six) hours as needed. For shortness of breath      . alendronate (FOSAMAX) 70 MG tablet Take 70 mg by mouth every 7 (seven) days. Take with a full glass of water on an empty stomach.       . Ascorbic Acid (VITAMIN C) 500 MG tablet Take 500 mg by mouth daily.        . cetirizine (ZYRTEC) 10 MG tablet Take 10 mg by mouth daily.        . Cholecalciferol (VITAMIN D) 1000 UNITS capsule Take 1,000 Units by mouth daily.        . clobetasol (OLUX) 0.05 % topical foam Apply 1 application topically 2 (two) times daily as needed. For psoriasis      . Dietary Management Product (AXONA) packet Take 40 g by mouth daily.        Marland Kitchen donepezil (ARICEPT) 5 MG tablet Take 5 mg by mouth at bedtime.       . furosemide (LASIX) 40 MG tablet Take 0.5 tablets (20 mg total) by mouth daily.  30 tablet    . LORazepam (ATIVAN) 0.5 MG tablet Take 0.5 tablets (0.25 mg total) by mouth  every 8 (eight) hours.  30 tablet    . memantine (NAMENDA) 10 MG tablet Take 10 mg by mouth 2 (two) times daily.       . Multiple Vitamins-Minerals (MULTIVITAMIN WITH MINERALS) tablet Take 1 tablet by mouth daily.        . nitroGLYCERIN (NITROSTAT) 0.4 MG SL tablet Place 0.4 mg under the tongue every 5 (five) minutes as needed. For chest pain      . Omega-3 Fatty Acids (FISH OIL) 1000 MG CAPS Take 1 capsule by mouth 2 (two) times daily.       Marland Kitchen POLYETHYLENE GLYCOL 3350 PO Take 1 Units by mouth daily.        . potassium chloride SA (K-DUR,KLOR-CON) 20 MEQ tablet Take 20 mEq by mouth daily.        . pravastatin (PRAVACHOL) 40 MG tablet Take 40 mg by mouth daily.      . Salicylic Acid-Cleanser (SALEX) 6 % (LOTION) KIT Apply topically.        . warfarin (COUMADIN) 2.5 MG tablet Take 2.5 mg by mouth daily. TAKE 1 TABLET (2.5 MG TOTAL) BY MOUTH DAILY. TAKE 1 TABLET DAILY EXCEPT 1/2 TABLET ON T,TH,SAT      . lisinopril (  PRINIVIL,ZESTRIL) 2.5 MG tablet Take 1 tablet (2.5 mg total) by mouth daily.  30 tablet  3    Past Medical History  Diagnosis Date  . ASCVD (arteriosclerotic cardiovascular disease)     acute MI in 1993;40%left main,80%LAD, 100%RCA-PTCA; EF of 40%;stress nuclear in2007; normal EF;  mixed inferolateral ischemia and infarction; CHF with normal EF in 2011  . CHF (congestive heart failure)     normal EF;mixed inferolateral ischemic and infarction;chf with normal EF in 2011  . Hypertension   . Hyperlipidemia   . Cerebrovascular disease     CVA by CT in 2007,but not noted in 2009;duplex in 12/2006 plaque without stenosis; Emergency Room evaluation in 12/2010 for transient aphasia and paresis-clopidogrel initially added to ASA Rx, but subsequently warfarin was substituted for these 2 agents  . Tobacco abuse   . Claudication   . Dementia     mild  . Kyphosis     severe  . Decreased hearing   . Atrial fibrillation   . Chronic anticoagulation   . Chronic kidney disease     Past  Surgical History  Procedure Date  . Abdominal hysterectomy   . Appendectomy   . Tonsillectomy   . Enucleation     Right-resulted from infection    ZOX:WRUEAV of systems complete and found to be negative unless listed above PHYSICAL EXAM BP 152/81  Pulse 63  Resp 16  Ht 5\' 2"  (1.575 m)  Wt 134 lb (60.782 kg)  BMI 24.51 kg/m2  General: Well developed, well nourished, in no acute distress Head: Eyes PERRLA, No xanthomas.   Normal cephalic and atramatic  Lungs: Clear bilaterally to auscultation and percussion. Heart: HRIR S1 S2, 2/6 systolic murmur,without MRG.  Pulses are 2+ & equal.            No carotid bruit. No JVD.  No abdominal bruits. No femoral bruits. Abdomen: Bowel sounds are positive, abdomen soft and non-tender without masses or                  Hernia's noted. Msk:  Back significant kyphosis, slow unsteady gait. Diminished strength and tone for age. Extremities: No clubbing, cyanosis or edema.  DP +1 Neuro: Alert with mild confusion.  Psych:  Good affect,    ASSESSMENT AND PLAN

## 2011-11-28 NOTE — Assessment & Plan Note (Signed)
Heart rate is well controlled without rate reducing medications. She is hypertensive on this visit and mildly hypertensive in clinic. Her son states that her BP is higher at home. I will start her on lisinopril 2.5 mg daily and have follow-up BP check next week. She will need BMET in one week as well.

## 2011-11-28 NOTE — Assessment & Plan Note (Signed)
Although she is confused, she offers no evidence of weakness or facial drooping on assessment. She is deconditioned but has no focal weakness. She will continue on coumadin, as she is dosed for her atrial fib.

## 2011-11-29 ENCOUNTER — Telehealth: Payer: Self-pay

## 2011-11-29 DIAGNOSIS — I10 Essential (primary) hypertension: Secondary | ICD-10-CM

## 2011-11-29 NOTE — Progress Notes (Signed)
**Note De-Identified Karson Chicas Obfuscation** Addended by: Demetrios Loll on: 11/29/2011 10:37 AM   Modules accepted: Orders

## 2011-11-29 NOTE — Telephone Encounter (Signed)
**Note De-Identified  Obfuscation** Per Joni Reining, NP, pt's sitter (CNA) advised that pt. needs to have a BMET drawn on Monday December 05, 2011 due to med changes at yesterday's visit. Pt. has a BP check on Monday and will have lab drawn at that time./ LV

## 2011-12-01 ENCOUNTER — Ambulatory Visit: Payer: Self-pay | Admitting: *Deleted

## 2011-12-01 DIAGNOSIS — Z7901 Long term (current) use of anticoagulants: Secondary | ICD-10-CM

## 2011-12-01 DIAGNOSIS — I4891 Unspecified atrial fibrillation: Secondary | ICD-10-CM

## 2011-12-05 ENCOUNTER — Ambulatory Visit (INDEPENDENT_AMBULATORY_CARE_PROVIDER_SITE_OTHER): Payer: 59 | Admitting: *Deleted

## 2011-12-05 VITALS — BP 100/70 | HR 78

## 2011-12-05 DIAGNOSIS — I10 Essential (primary) hypertension: Secondary | ICD-10-CM

## 2011-12-05 NOTE — Progress Notes (Signed)
Presents today for return blood pressure check.  Medications reconciled and states taking all medications as prescribed.  No specific complaints noted.

## 2011-12-06 ENCOUNTER — Encounter: Payer: Self-pay | Admitting: *Deleted

## 2011-12-06 LAB — BASIC METABOLIC PANEL
BUN: 15 mg/dL (ref 6–23)
CO2: 36 mEq/L — ABNORMAL HIGH (ref 19–32)
Calcium: 10.2 mg/dL (ref 8.4–10.5)
Chloride: 98 mEq/L (ref 96–112)
Creat: 1.32 mg/dL — ABNORMAL HIGH (ref 0.50–1.10)
Glucose, Bld: 81 mg/dL (ref 70–99)

## 2011-12-06 NOTE — Progress Notes (Signed)
Thanks, no changes

## 2011-12-19 ENCOUNTER — Ambulatory Visit (INDEPENDENT_AMBULATORY_CARE_PROVIDER_SITE_OTHER): Payer: 59 | Admitting: *Deleted

## 2011-12-19 DIAGNOSIS — I4891 Unspecified atrial fibrillation: Secondary | ICD-10-CM

## 2011-12-19 DIAGNOSIS — Z7901 Long term (current) use of anticoagulants: Secondary | ICD-10-CM

## 2012-01-04 ENCOUNTER — Ambulatory Visit (INDEPENDENT_AMBULATORY_CARE_PROVIDER_SITE_OTHER): Payer: PRIVATE HEALTH INSURANCE | Admitting: *Deleted

## 2012-01-04 DIAGNOSIS — Z7901 Long term (current) use of anticoagulants: Secondary | ICD-10-CM

## 2012-01-04 DIAGNOSIS — I4891 Unspecified atrial fibrillation: Secondary | ICD-10-CM

## 2012-01-04 LAB — POCT INR: INR: 2.2

## 2012-01-29 ENCOUNTER — Emergency Department (HOSPITAL_COMMUNITY): Payer: PRIVATE HEALTH INSURANCE

## 2012-01-29 ENCOUNTER — Inpatient Hospital Stay (HOSPITAL_COMMUNITY): Payer: PRIVATE HEALTH INSURANCE

## 2012-01-29 ENCOUNTER — Inpatient Hospital Stay (HOSPITAL_COMMUNITY)
Admission: EM | Admit: 2012-01-29 | Discharge: 2012-01-31 | DRG: 065 | Disposition: A | Discharge status: Home / Self Care | Payer: PRIVATE HEALTH INSURANCE | Attending: Family Medicine | Admitting: Family Medicine

## 2012-01-29 ENCOUNTER — Encounter (HOSPITAL_COMMUNITY): Payer: Self-pay | Admitting: Neurology

## 2012-01-29 DIAGNOSIS — I2510 Atherosclerotic heart disease of native coronary artery without angina pectoris: Secondary | ICD-10-CM | POA: Diagnosis present

## 2012-01-29 DIAGNOSIS — G459 Transient cerebral ischemic attack, unspecified: Secondary | ICD-10-CM | POA: Diagnosis present

## 2012-01-29 DIAGNOSIS — M4000 Postural kyphosis, site unspecified: Secondary | ICD-10-CM | POA: Diagnosis present

## 2012-01-29 DIAGNOSIS — E785 Hyperlipidemia, unspecified: Secondary | ICD-10-CM | POA: Diagnosis present

## 2012-01-29 DIAGNOSIS — I6350 Cerebral infarction due to unspecified occlusion or stenosis of unspecified cerebral artery: Principal | ICD-10-CM | POA: Diagnosis present

## 2012-01-29 DIAGNOSIS — I679 Cerebrovascular disease, unspecified: Secondary | ICD-10-CM | POA: Diagnosis present

## 2012-01-29 DIAGNOSIS — N184 Chronic kidney disease, stage 4 (severe): Secondary | ICD-10-CM | POA: Diagnosis present

## 2012-01-29 DIAGNOSIS — Z7901 Long term (current) use of anticoagulants: Secondary | ICD-10-CM

## 2012-01-29 DIAGNOSIS — I10 Essential (primary) hypertension: Secondary | ICD-10-CM | POA: Diagnosis present

## 2012-01-29 DIAGNOSIS — F17200 Nicotine dependence, unspecified, uncomplicated: Secondary | ICD-10-CM | POA: Diagnosis present

## 2012-01-29 DIAGNOSIS — I4891 Unspecified atrial fibrillation: Secondary | ICD-10-CM | POA: Diagnosis present

## 2012-01-29 DIAGNOSIS — R209 Unspecified disturbances of skin sensation: Secondary | ICD-10-CM | POA: Diagnosis present

## 2012-01-29 DIAGNOSIS — F0390 Unspecified dementia without behavioral disturbance: Secondary | ICD-10-CM | POA: Diagnosis present

## 2012-01-29 DIAGNOSIS — I129 Hypertensive chronic kidney disease with stage 1 through stage 4 chronic kidney disease, or unspecified chronic kidney disease: Secondary | ICD-10-CM | POA: Diagnosis present

## 2012-01-29 DIAGNOSIS — I739 Peripheral vascular disease, unspecified: Secondary | ICD-10-CM | POA: Diagnosis present

## 2012-01-29 DIAGNOSIS — I5022 Chronic systolic (congestive) heart failure: Secondary | ICD-10-CM | POA: Diagnosis present

## 2012-01-29 DIAGNOSIS — I509 Heart failure, unspecified: Secondary | ICD-10-CM | POA: Diagnosis present

## 2012-01-29 DIAGNOSIS — Z66 Do not resuscitate: Secondary | ICD-10-CM | POA: Diagnosis not present

## 2012-01-29 DIAGNOSIS — Z970 Presence of artificial eye: Secondary | ICD-10-CM

## 2012-01-29 DIAGNOSIS — I252 Old myocardial infarction: Secondary | ICD-10-CM

## 2012-01-29 DIAGNOSIS — R4701 Aphasia: Secondary | ICD-10-CM | POA: Diagnosis present

## 2012-01-29 DIAGNOSIS — N390 Urinary tract infection, site not specified: Secondary | ICD-10-CM | POA: Diagnosis present

## 2012-01-29 LAB — COMPREHENSIVE METABOLIC PANEL
ALT: 11 U/L (ref 0–35)
AST: 18 U/L (ref 0–37)
CO2: 32 mEq/L (ref 19–32)
Chloride: 99 mEq/L (ref 96–112)
Creatinine, Ser: 1.24 mg/dL — ABNORMAL HIGH (ref 0.50–1.10)
GFR calc Af Amer: 44 mL/min — ABNORMAL LOW (ref >90)
GFR calc non Af Amer: 38 mL/min — ABNORMAL LOW (ref >90)
Glucose, Bld: 104 mg/dL — ABNORMAL HIGH (ref 70–99)
Sodium: 138 mEq/L (ref 135–145)
Total Bilirubin: 0.3 mg/dL (ref 0.3–1.2)

## 2012-01-29 LAB — APTT: aPTT: 41 seconds — ABNORMAL HIGH (ref 24–37)

## 2012-01-29 LAB — URINALYSIS, ROUTINE W REFLEX MICROSCOPIC
Glucose, UA: NEGATIVE mg/dL
Hgb urine dipstick: NEGATIVE
Leukocytes, UA: NEGATIVE
pH: 5.5 (ref 5.0–8.0)

## 2012-01-29 LAB — DIFFERENTIAL
Basophils Absolute: 0.1 10*3/uL (ref 0.0–0.1)
Eosinophils Relative: 2 % (ref 0–5)
Lymphocytes Relative: 17 % (ref 12–46)
Lymphs Abs: 2 10*3/uL (ref 0.7–4.0)
Monocytes Absolute: 1.1 10*3/uL — ABNORMAL HIGH (ref 0.1–1.0)
Neutro Abs: 8.4 10*3/uL — ABNORMAL HIGH (ref 1.7–7.7)

## 2012-01-29 LAB — CBC
HCT: 43.8 % (ref 36.0–46.0)
Hemoglobin: 14.5 g/dL (ref 12.0–15.0)
MCV: 89.8 fL (ref 78.0–100.0)
RBC: 4.88 MIL/uL (ref 3.87–5.11)
RDW: 14 % (ref 11.5–15.5)
WBC: 11.7 10*3/uL — ABNORMAL HIGH (ref 4.0–10.5)

## 2012-01-29 MED ORDER — ALBUTEROL SULFATE (5 MG/ML) 0.5% IN NEBU: 2.5000 mg | INHALATION_SOLUTION | RESPIRATORY_TRACT | Status: DC | PRN

## 2012-01-29 MED ORDER — WARFARIN - PHARMACIST DOSING INPATIENT: Freq: Every day | Status: DC

## 2012-01-29 MED ORDER — HYDROCODONE-ACETAMINOPHEN 5-325 MG PO TABS: 1.0000 | ORAL_TABLET | ORAL | Status: DC | PRN

## 2012-01-29 MED ORDER — DONEPEZIL HCL 5 MG PO TABS
5.0000 mg | ORAL_TABLET | Freq: Every day | ORAL | Status: DC
  Administered 2012-01-29 – 2012-01-30 (×2): 5 mg via ORAL
  Filled 2012-01-29 (×3): qty 1

## 2012-01-29 MED ORDER — SODIUM CHLORIDE 0.9 % IV SOLN
INTRAVENOUS | Status: AC
  Administered 2012-01-29: via INTRAVENOUS

## 2012-01-29 MED ORDER — GUAIFENESIN-DM 100-10 MG/5ML PO SYRP: 5.0000 mL | ORAL_SOLUTION | ORAL | Status: DC | PRN

## 2012-01-29 MED ORDER — ZOLPIDEM TARTRATE 5 MG PO TABS
5.0000 mg | ORAL_TABLET | Freq: Every evening | ORAL | Status: DC | PRN
  Administered 2012-01-29: 5 mg via ORAL
  Filled 2012-01-29: qty 1

## 2012-01-29 MED ORDER — LORAZEPAM 0.5 MG PO TABS
0.5000 mg | ORAL_TABLET | Freq: Three times a day (TID) | ORAL | Status: DC
  Administered 2012-01-29 – 2012-01-30 (×4): 0.5 mg via ORAL
  Filled 2012-01-29 (×4): qty 1

## 2012-01-29 MED ORDER — ONDANSETRON HCL 4 MG PO TABS: 4.0000 mg | ORAL_TABLET | Freq: Four times a day (QID) | ORAL | Status: DC | PRN

## 2012-01-29 MED ORDER — VITAMIN C 500 MG PO TABS
500.0000 mg | ORAL_TABLET | Freq: Every day | ORAL | Status: DC
  Administered 2012-01-29 – 2012-01-31 (×3): 500 mg via ORAL
  Filled 2012-01-29 (×3): qty 1

## 2012-01-29 MED ORDER — SIMVASTATIN 10 MG PO TABS
10.0000 mg | ORAL_TABLET | Freq: Every day | ORAL | Status: DC
  Administered 2012-01-29 – 2012-01-30 (×2): 10 mg via ORAL
  Filled 2012-01-29 (×3): qty 1

## 2012-01-29 MED ORDER — SENNOSIDES-DOCUSATE SODIUM 8.6-50 MG PO TABS: 1.0000 | ORAL_TABLET | Freq: Every evening | ORAL | Status: DC | PRN

## 2012-01-29 MED ORDER — OMEGA-3-ACID ETHYL ESTERS 1 G PO CAPS
1.0000 g | ORAL_CAPSULE | Freq: Every day | ORAL | Status: DC
  Administered 2012-01-30 – 2012-01-31 (×2): 1 g via ORAL
  Filled 2012-01-29 (×2): qty 1

## 2012-01-29 MED ORDER — SODIUM CHLORIDE 0.9 % IJ SOLN
3.0000 mL | Freq: Two times a day (BID) | INTRAMUSCULAR | Status: DC
  Administered 2012-01-29 – 2012-01-30 (×2): 3 mL via INTRAVENOUS

## 2012-01-29 MED ORDER — WARFARIN SODIUM 2.5 MG PO TABS
2.5000 mg | ORAL_TABLET | Freq: Once | ORAL | Status: AC
  Administered 2012-01-29: 2.5 mg via ORAL
  Filled 2012-01-29: qty 1

## 2012-01-29 MED ORDER — SODIUM CHLORIDE 0.9 % IV SOLN: INTRAVENOUS | Status: AC

## 2012-01-29 MED ORDER — NITROGLYCERIN 0.4 MG SL SUBL: 0.4000 mg | SUBLINGUAL_TABLET | SUBLINGUAL | Status: DC | PRN

## 2012-01-29 MED ORDER — HALOPERIDOL 0.5 MG PO TABS
0.5000 mg | ORAL_TABLET | Freq: Every day | ORAL | Status: DC
  Filled 2012-01-29: qty 1

## 2012-01-29 MED ORDER — ONDANSETRON HCL 4 MG/2ML IJ SOLN: 4.0000 mg | Freq: Four times a day (QID) | INTRAMUSCULAR | Status: DC | PRN

## 2012-01-29 MED ORDER — ADULT MULTIVITAMIN W/MINERALS CH
1.0000 | ORAL_TABLET | Freq: Every day | ORAL | Status: DC
  Administered 2012-01-29 – 2012-01-31 (×3): 1 via ORAL
  Filled 2012-01-29 (×3): qty 1

## 2012-01-29 MED ORDER — WARFARIN SODIUM 2.5 MG PO TABS: 2.5000 mg | ORAL_TABLET | Freq: Every day | ORAL | Status: DC

## 2012-01-29 MED ORDER — ASPIRIN 81 MG PO CHEW
324.0000 mg | CHEWABLE_TABLET | Freq: Once | ORAL | Status: AC
  Administered 2012-01-29: 324 mg via ORAL
  Filled 2012-01-29: qty 4

## 2012-01-29 MED ORDER — MULTI-VITAMIN/MINERALS PO TABS: 1.0000 | ORAL_TABLET | Freq: Every day | ORAL | Status: DC

## 2012-01-29 MED ORDER — MEMANTINE HCL 10 MG PO TABS
10.0000 mg | ORAL_TABLET | Freq: Two times a day (BID) | ORAL | Status: DC
  Administered 2012-01-29 – 2012-01-31 (×4): 10 mg via ORAL
  Filled 2012-01-29 (×5): qty 1

## 2012-01-29 NOTE — Progress Notes (Signed)
ANTICOAGULATION CONSULT NOTE - Initial Consult  Pharmacy Consult for warfarin Indication: afib, cva  Allergies  Allergen Reactions  . Haldol (Haloperidol Decanoate)     Over sedation  . Sulfa Antibiotics Other (See Comments)    uknown  . Sulfonamide Derivatives Other (See Comments)    unknown    Patient Measurements:   Heparin Dosing Weight:   Vital Signs: Temp: 97.6 F (36.4 C) (04/28 1902) Temp src: Oral (04/28 1902) BP: 108/63 mmHg (04/28 1902) Pulse Rate: 68  (04/28 1902)  Labs:  Basename 01/29/12 1438  HGB 14.5  HCT 43.8  PLT 206  APTT 41*  LABPROT 22.0*  INR 1.89*  HEPARINUNFRC --  CREATININE 1.24*  CKTOTAL 47  CKMB 1.6  TROPONINI <0.30   The CrCl is unknown because both a height and weight (above a minimum accepted value) are required for this calculation.  Medical History: Past Medical History  Diagnosis Date  . ASCVD (arteriosclerotic cardiovascular disease)     acute MI in 1993;40%left main,80%LAD, 100%RCA-PTCA; EF of 40%;stress nuclear in2007; normal EF;  mixed inferolateral ischemia and infarction; CHF with normal EF in 2011  . CHF (congestive heart failure)     normal EF;mixed inferolateral ischemic and infarction;chf with normal EF in 2011  . Hypertension   . Hyperlipidemia   . Cerebrovascular disease     CVA by CT in 2007,but not noted in 2009;duplex in 12/2006 plaque without stenosis; Emergency Room evaluation in 12/2010 for transient aphasia and paresis-clopidogrel initially added to ASA Rx, but subsequently warfarin was substituted for these 2 agents  . Tobacco abuse   . Claudication   . Dementia     mild  . Kyphosis     severe  . Decreased hearing   . Atrial fibrillation   . Chronic anticoagulation   . Chronic kidney disease     Medications:  Prescriptions prior to admission  Medication Sig Dispense Refill  . alendronate (FOSAMAX) 70 MG tablet Take 70 mg by mouth every 7 (seven) days. Take with a full glass of water on an empty  stomach.       . cetirizine (ZYRTEC) 10 MG tablet Take 10 mg by mouth daily.        . Cholecalciferol (VITAMIN D) 1000 UNITS capsule Take 1,000 Units by mouth daily.        . Dietary Management Product (AXONA) packet Take 40 g by mouth daily.        Marland Kitchen donepezil (ARICEPT) 5 MG tablet Take 5 mg by mouth at bedtime.       . furosemide (LASIX) 40 MG tablet Take 20 mg by mouth daily.      . haloperidol (HALDOL) 0.5 MG tablet Take 0.5 mg by mouth at bedtime.      Marland Kitchen levofloxacin (LEVAQUIN) 500 MG tablet Take 500 mg by mouth daily.      Marland Kitchen LORazepam (ATIVAN) 0.5 MG tablet Take 0.5 mg by mouth every 8 (eight) hours.      . memantine (NAMENDA) 10 MG tablet Take 10 mg by mouth 2 (two) times daily.       . Multiple Vitamins-Minerals (MULTIVITAMIN WITH MINERALS) tablet Take 1 tablet by mouth daily.        . nitroGLYCERIN (NITROSTAT) 0.4 MG SL tablet Place 0.4 mg under the tongue every 5 (five) minutes as needed. For chest pain      . Omega-3 Fatty Acids (FISH OIL) 1000 MG CAPS Take 1,200 mg by mouth 2 (two) times daily.       Marland Kitchen  pravastatin (PRAVACHOL) 40 MG tablet Take 40 mg by mouth at bedtime.       . vitamin C (ASCORBIC ACID) 500 MG tablet Take 500 mg by mouth daily.      Marland Kitchen warfarin (COUMADIN) 2.5 MG tablet Take 2.5 mg by mouth daily. TAKE 1 TABLET (2.5 MG TOTAL) BY MOUTH DAILY. TAKE 1 TABLET DAILY EXCEPT 1/2 TABLET ON T,TH,SAT      . albuterol-ipratropium (COMBIVENT) 18-103 MCG/ACT inhaler Inhale 2 puffs into the lungs every 6 (six) hours as needed. For shortness of breath        Assessment: 78 yo F with h/o afib and cva now admitted with transient slurred speech that resolved in ED.  INR slightly less than goal.  Home dose of warfarin as noted above.  Patient already took today's dose but INR still slightly less than goal.  Goal of Therapy:  INR 2-3   Plan:  Warfarin 2.5 mg po x1 tonight Daily INR  Kylena Mole L. Illene Bolus, PharmD, BCPS Clinical Pharmacist Pager: [phone] 01/29/2012 7:09 PM

## 2012-01-29 NOTE — Code Documentation (Signed)
Code stroke called at 1427, patient arrived to [facility] via EMS at 1432, EDP seen at 1433, stroke team at 1430, patient in CT at 1435, Lab at 19 ct read 1453.  LSN 1350, she lives with family and family noticed she was having difficulty speaking.  NIHSS 2, pt has hx of dementia. Cancelled at 1447

## 2012-01-29 NOTE — ED Provider Notes (Signed)
History     CSN: [id number]  Arrival date & time 01/29/12  1432   First MD Initiated Contact with Patient 01/29/12 1503      Chief Complaint  Patient presents with  . Code Stroke    (Consider location/radiation/quality/duration/timing/severity/associated sxs/prior treatment) Patient is a 78 y.o. female presenting with neurologic complaint. The history is provided by the patient. No language interpreter was used.  Neurologic Problem The primary symptoms include paresthesias, focal weakness and speech change. Primary symptoms do not include headaches, syncope, loss of consciousness, altered mental status, dizziness, visual change, loss of sensation, fever, nausea or vomiting. The symptoms are improving. The neurological symptoms are focal.  Weakness began less than 1 hour ago. The weakness is improving. There is near normal muscle function with maximum physical effort.  There is weakness in these regions/motions: facial muscles. There is impairment of the following actions: articulating words.  Additional symptoms include weakness. Additional symptoms do not include neck stiffness. Medical issues also include cerebral vascular accident.    Past Medical History  Diagnosis Date  . ASCVD (arteriosclerotic cardiovascular disease)     acute MI in 1993;40%left main,80%LAD, 100%RCA-PTCA; EF of 40%;stress nuclear in2007; normal EF;  mixed inferolateral ischemia and infarction; CHF with normal EF in 2011  . CHF (congestive heart failure)     normal EF;mixed inferolateral ischemic and infarction;chf with normal EF in 2011  . Hypertension   . Hyperlipidemia   . Cerebrovascular disease     CVA by CT in 2007,but not noted in 2009;duplex in 12/2006 plaque without stenosis; Emergency Room evaluation in 12/2010 for transient aphasia and paresis-clopidogrel initially added to ASA Rx, but subsequently warfarin was substituted for these 2 agents  . Tobacco abuse   . Claudication   . Dementia     mild    . Kyphosis     severe  . Decreased hearing   . Atrial fibrillation   . Chronic anticoagulation   . Chronic kidney disease     Past Surgical History  Procedure Date  . Abdominal hysterectomy   . Appendectomy   . Tonsillectomy   . Enucleation     Right-resulted from infection    Family History  Problem Relation Age of Onset  . Dementia Mother   . Atrial fibrillation Son   . Hypertension Son     History  Substance Use Topics  . Smoking status: Former Smoker    Quit date: 01/05/1994  . Smokeless tobacco: Never Used  . Alcohol Use: No    OB History    Grav Para Term Preterm Abortions TAB SAB Ect Mult Living                  Review of Systems  Constitutional: Positive for fatigue. Negative for fever, chills, activity change and appetite change.  HENT: Negative for congestion, sore throat, rhinorrhea, neck pain and neck stiffness.   Eyes: Negative for pain, discharge, itching and visual disturbance.  Respiratory: Negative for cough and shortness of breath.   Cardiovascular: Negative for chest pain, palpitations and syncope.  Gastrointestinal: Negative for nausea, vomiting and abdominal pain.  Genitourinary: Negative for dysuria, urgency, frequency and flank pain.  Musculoskeletal: Negative for myalgias, back pain and arthralgias.  Neurological: Positive for speech change, focal weakness, weakness, numbness and paresthesias. Negative for dizziness, loss of consciousness, light-headedness and headaches.  Psychiatric/Behavioral: Negative for altered mental status.  All other systems reviewed and are negative.    Allergies  Haldol; Sulfa antibiotics; and Sulfonamide derivatives  Home Medications   Current Outpatient Rx  Name Route Sig Dispense Refill  . IPRATROPIUM-ALBUTEROL 18-103 MCG/ACT IN AERO Inhalation Inhale 2 puffs into the lungs every 6 (six) hours as needed. For shortness of breath    . ALENDRONATE SODIUM 70 MG PO TABS Oral Take 70 mg by mouth every 7  (seven) days. Take with a full glass of water on an empty stomach.     Marland Kitchen VITAMIN C 500 MG PO TABS Oral Take 500 mg by mouth daily.      Marland Kitchen CETIRIZINE HCL 10 MG PO TABS Oral Take 10 mg by mouth daily.      Marland Kitchen VITAMIN D 1000 UNITS PO CAPS Oral Take 1,000 Units by mouth daily.      Marland Kitchen CLOBETASOL PROPIONATE 0.05 % EX FOAM Topical Apply 1 application topically 2 (two) times daily as needed. For psoriasis    . AXONA PO PACK Oral Take 40 g by mouth daily.      . DONEPEZIL HCL 5 MG PO TABS Oral Take 5 mg by mouth at bedtime.     Marland Kitchen LISINOPRIL 2.5 MG PO TABS Oral Take 1 tablet (2.5 mg total) by mouth daily. 30 tablet 3  . LORAZEPAM 0.5 MG PO TABS Oral Take 0.5 tablets (0.25 mg total) by mouth every 8 (eight) hours. 30 tablet   . MEMANTINE HCL 10 MG PO TABS Oral Take 10 mg by mouth 2 (two) times daily.     . MULTI-VITAMIN/MINERALS PO TABS Oral Take 1 tablet by mouth daily.      Marland Kitchen NITROGLYCERIN 0.4 MG SL SUBL Sublingual Place 0.4 mg under the tongue every 5 (five) minutes as needed. For chest pain    . FISH OIL 1000 MG PO CAPS Oral Take 1 capsule by mouth 2 (two) times daily.     Marland Kitchen POLYETHYLENE GLYCOL 3350 PO Oral Take 1 Units by mouth daily.      Marland Kitchen POTASSIUM CHLORIDE CRYS ER 20 MEQ PO TBCR Oral Take 20 mEq by mouth daily.      Marland Kitchen PRAVASTATIN SODIUM 40 MG PO TABS Oral Take 40 mg by mouth daily.    Marland Kitchen SALEX 6 % (LOTION) EX KIT Apply externally Apply topically.      . WARFARIN SODIUM 2.5 MG PO TABS Oral Take 2.5 mg by mouth daily. TAKE 1 TABLET (2.5 MG TOTAL) BY MOUTH DAILY. TAKE 1 TABLET DAILY EXCEPT 1/2 TABLET ON T,TH,SAT      BP 95/54  Pulse 78  Temp(Src) 97.6 F (36.4 C) (Oral)  Resp 16  SpO2 97%  Physical Exam  Nursing note and vitals reviewed. Constitutional: She is oriented to person, place, and time. She appears well-developed and well-nourished. No distress.       Hard of hearing  HENT:  Head: Normocephalic and atraumatic.  Mouth/Throat: Oropharynx is clear and moist. No oropharyngeal exudate.   Eyes: Conjunctivae and EOM are normal. Pupils are equal, round, and reactive to light.  Neck: Normal range of motion. Neck supple.  Cardiovascular: Normal rate, normal heart sounds and intact distal pulses.  Exam reveals no gallop and no friction rub.   No murmur heard.      Irregular rhythm  Pulmonary/Chest: Effort normal and breath sounds normal. No respiratory distress. She exhibits no tenderness.  Abdominal: Soft. Bowel sounds are normal. There is no tenderness.  Musculoskeletal: Normal range of motion. She exhibits no edema and no tenderness.  Neurological: She is alert and oriented to person, place, and time. She has  normal strength. A cranial nerve deficit (very mild r facial droop) is present. No sensory deficit.  Skin: Skin is warm and dry. No rash noted.    ED Course  Procedures (including critical care time)   Date: 01/29/2012  Rate: 70  Rhythm: atrial fibrillation, premature ventricular contractions (PVC) and ventricular trigeminy  QRS Axis: normal  Intervals: normal  ST/T Wave abnormalities: normal  Conduction Disutrbances:right bundle branch block  Narrative Interpretation:   Old EKG Reviewed: unchanged  CRITICAL CARE Performed by: Dayton Bailiff   Total critical care time: 30 min  Critical care time was exclusive of separately billable procedures and treating other patients.  Critical care was necessary to treat or prevent imminent or life-threatening deterioration.  Critical care was time spent personally by me on the following activities: development of treatment plan with patient and/or surrogate as well as nursing, discussions with consultants, evaluation of patient's response to treatment, examination of patient, obtaining history from patient or surrogate, ordering and performing treatments and interventions, ordering and review of laboratory studies, ordering and review of radiographic studies, pulse oximetry and re-evaluation of patient's condition.  Labs  Reviewed  PROTIME-INR - Abnormal; Notable for the following:    Prothrombin Time 22.0 (*)    INR 1.89 (*)    All other components within normal limits  APTT - Abnormal; Notable for the following:    aPTT 41 (*)    All other components within normal limits  CBC - Abnormal; Notable for the following:    WBC 11.7 (*)    All other components within normal limits  DIFFERENTIAL - Abnormal; Notable for the following:    Neutro Abs 8.4 (*)    Monocytes Absolute 1.1 (*)    All other components within normal limits  COMPREHENSIVE METABOLIC PANEL - Abnormal; Notable for the following:    Glucose, Bld 104 (*)    Creatinine, Ser 1.24 (*)    GFR calc non Af Amer 38 (*)    GFR calc Af Amer 44 (*)    All other components within normal limits  CK TOTAL AND CKMB  TROPONIN I  GLUCOSE, CAPILLARY  URINALYSIS, ROUTINE W REFLEX MICROSCOPIC   Ct Head Wo Contrast  01/29/2012  *RADIOLOGY REPORT*  Clinical Data: Code stroke  CT HEAD WITHOUT CONTRAST  Technique:  Contiguous axial images were obtained from the base of the skull through the vertex without contrast.  Comparison: 11/19/2011  Findings: There is diffuse patchy low density throughout the subcortical and periventricular white matter consistent with chronic small vessel ischemic change.  There is prominence of the sulci and ventricles consistent with brain atrophy.  The tubular vascular calcifications within the right occipital and parietal lobes are again noted and appear unchanged from previous exam.  There is no evidence for acute brain infarct, hemorrhage or mass.  The paranasal sinuses and the mastoid air cells appear clear.  Right globe prosthesis noted.  IMPRESSION:  1.  Small vessel ischemic disease and brain atrophy. 2.  No acute intracranial abnormalities.  Original Report Authenticated By: Rosealee Albee, M.D.   Dg Chest Port 1 View  01/29/2012  *RADIOLOGY REPORT*  Clinical Data: Shortness of breath with generalized weakness.  PORTABLE CHEST -  1 VIEW  Comparison: 12/10/2010 and 08/01/2010 radiographs.  Findings: 1532 hours.  The patient is rotated to the right as before. The heart size and mediastinal contours are stable.  The heart remains mildly enlarged.  Pulmonary aeration has improved. There is no focal airspace disease or  edema.  There are probable calcified granulomas on the right.  No acute osseous findings are seen.  IMPRESSION: Improved aeration with stable cardiomegaly.  No acute cardiopulmonary process.  Original Report Authenticated By: Gerrianne Scale, M.D.     1. TIA (transient ischemic attack)       MDM  Code CVA rescinded by neurology on their evaluation. CT negative for acute pathology.  MRI ordered and asa administered as the INR is subtherapeutic.  Laboratory studies completed.  Discussed with [facility] hospitalists who accepted the patient for admission for further evaluation and treatment.  Not a cdu candidate as has ABCD2 score is too high given hx of prior cva and Cydney Ok, MD 01/29/12 [id number]

## 2012-01-29 NOTE — H&P (Addendum)
Patient Demographics  Angela [name], is a 78 y.o. female  ZOX:[id number]  [id number]:[id number]  DOB - 1921/08/09  Admit Date - 01/29/2012  Outpatient Primary MD for the patient is Cassell Smiles., MD, MD   With History of -  Past Medical History  Diagnosis Date  . ASCVD (arteriosclerotic cardiovascular disease)     acute MI in 1993;40%left main,80%LAD, 100%RCA-PTCA; EF of 40%;stress nuclear in2007; normal EF;  mixed inferolateral ischemia and infarction; CHF with normal EF in 2011  . CHF (congestive heart failure)     normal EF;mixed inferolateral ischemic and infarction;chf with normal EF in 2011  . Hypertension   . Hyperlipidemia   . Cerebrovascular disease     CVA by CT in 2007,but not noted in 2009;duplex in 12/2006 plaque without stenosis; Emergency Room evaluation in 12/2010 for transient aphasia and paresis-clopidogrel initially added to ASA Rx, but subsequently warfarin was substituted for these 2 agents  . Tobacco abuse   . Claudication   . Dementia     mild  . Kyphosis     severe  . Decreased hearing   . Atrial fibrillation   . Chronic anticoagulation   . Chronic kidney disease       Past Surgical History  Procedure Date  . Abdominal hysterectomy   . Appendectomy   . Tonsillectomy   . Enucleation     Right-resulted from infection    in for   Chief Complaint  Patient presents with  . Code Stroke     HPI  Angela [name]  is a 78 y.o. female, with history of atrial fibrillation who is on Coumadin, 3 CVAs in the past, CAD, chronic diastolic dysfunction, who lives with her son and was watching TV this afternoon when she started complaining of difficulty in speaking words, poor balance at baseline was also worse at that time, at that point son called 911 and EMS brought to her to the ER, where most of her symptoms had completely resolved, a code stroke was called, she was seen by neurologist and had a head CT which was unremarkable, good stroke was called off, I was  called to admit the patient for possible TIA versus resolving CVA.  At the time of my examination her if easier has almost completely resolved, there are no focal neurological deficit, there is a faint left-sided facial droop unclear whether it is new or old son unable to tell if there is any change in her facial symmetry or whether this is old or new. Patient does have right-sided ptosis but that is due to prosthetic right eye and it is old.    Review of Systems    In addition to the HPI above,  No Fever-chills, No Headache, No changes with Vision or hearing, No problems swallowing food or Liquids, No Chest pain, Cough or Shortness of Breath, No Abdominal pain, No Nausea or Vommitting, Bowel movements are regular, No Blood in stool or Urine, No dysuria, No new skin rashes or bruises, No new joints pains-aches,  No new weakness, tingling, numbness in any extremity, No recent weight gain or loss, No polyuria, polydypsia or polyphagia, No significant Mental Stressors.  A full 10 point Review of Systems was done, except as stated above, all other Review of Systems were negative.   Social History History  Substance Use Topics  . Smoking status: Former Smoker    Quit date: 01/05/1994  . Smokeless tobacco: Never Used  . Alcohol Use: No     Family History Family  History  Problem Relation Age of Onset  . Dementia Mother   . Atrial fibrillation Son   . Hypertension Son      Prior to Admission medications   Medication Sig Start Date End Date Taking? Authorizing Provider  alendronate (FOSAMAX) 70 MG tablet Take 70 mg by mouth every 7 (seven) days. Take with a full glass of water on an empty stomach.    Yes Historical Provider, MD  cetirizine (ZYRTEC) 10 MG tablet Take 10 mg by mouth daily.     Yes Historical Provider, MD  Cholecalciferol (VITAMIN D) 1000 UNITS capsule Take 1,000 Units by mouth daily.     Yes Historical Provider, MD  Dietary Management Product (AXONA) packet  Take 40 g by mouth daily.     Yes Historical Provider, MD  donepezil (ARICEPT) 5 MG tablet Take 5 mg by mouth at bedtime.    Yes Historical Provider, MD  furosemide (LASIX) 40 MG tablet Take 20 mg by mouth daily.   Yes Historical Provider, MD  haloperidol (HALDOL) 0.5 MG tablet Take 0.5 mg by mouth at bedtime.   Yes Historical Provider, MD  levofloxacin (LEVAQUIN) 500 MG tablet Take 500 mg by mouth daily.   Yes Historical Provider, MD  LORazepam (ATIVAN) 0.5 MG tablet Take 0.5 mg by mouth every 8 (eight) hours.   Yes Historical Provider, MD  memantine (NAMENDA) 10 MG tablet Take 10 mg by mouth 2 (two) times daily.    Yes Historical Provider, MD  Multiple Vitamins-Minerals (MULTIVITAMIN WITH MINERALS) tablet Take 1 tablet by mouth daily.     Yes Historical Provider, MD  nitroGLYCERIN (NITROSTAT) 0.4 MG SL tablet Place 0.4 mg under the tongue every 5 (five) minutes as needed. For chest pain 05/25/11 05/24/12 Yes Jodelle Gross, NP  Omega-3 Fatty Acids (FISH OIL) 1000 MG CAPS Take 1,200 mg by mouth 2 (two) times daily.    Yes Historical Provider, MD  pravastatin (PRAVACHOL) 40 MG tablet Take 40 mg by mouth at bedtime.    Yes Historical Provider, MD  vitamin C (ASCORBIC ACID) 500 MG tablet Take 500 mg by mouth daily.   Yes Historical Provider, MD  warfarin (COUMADIN) 2.5 MG tablet Take 2.5 mg by mouth daily. TAKE 1 TABLET (2.5 MG TOTAL) BY MOUTH DAILY. TAKE 1 TABLET DAILY EXCEPT 1/2 TABLET ON T,TH,SAT   Yes Historical Provider, MD  albuterol-ipratropium (COMBIVENT) 18-103 MCG/ACT inhaler Inhale 2 puffs into the lungs every 6 (six) hours as needed. For shortness of breath    Historical Provider, MD    Allergies  Allergen Reactions  . Haldol (Haloperidol Decanoate)     Over sedation  . Sulfa Antibiotics Other (See Comments)    uknown  . Sulfonamide Derivatives Other (See Comments)    unknown    Physical Exam  Vitals  Blood pressure 95/54, pulse 65, temperature 97.6 F (36.4 C), temperature  source Oral, resp. rate 16, SpO2 96.00%.   1. General frail elderly Caucasian female lying in bed in NAD,    2. Normal affect and insight, Not Suicidal or Homicidal, Awake Alert, Oriented x 2  3. No F.N deficits, ALL C.Nerves Intact, Strength 5/5 all 4 extremities, Sensation intact all 4 extremities, Plantars down going.  4. Ears and Eyes appear Normal, except she has a right prosthetic eye, Conjunctivae clear, PERRLA. Moist Oral Mucosa. Questionable mild left-sided facial droop.  5. Supple Neck, No JVD, No cervical lymphadenopathy appriciated, No Carotid Bruits.  6. Symmetrical Chest wall movement, Good air movement bilaterally,  CTAB.  7. RRR, No Gallops, Rubs or Murmurs, No Parasternal Heave.  8. Positive Bowel Sounds, Abdomen Soft, Non tender, No organomegaly appriciated,No rebound -guarding or rigidity.  9.  No Cyanosis, Normal Skin Turgor, No Skin Rash or Bruise.  10. Good muscle tone,  joints appear normal , no effusions, Normal ROM.  11. No Palpable Lymph Nodes in Neck or Axillae     Data Review  CBC  Lab 01/29/12 1438  WBC 11.7*  HGB 14.5  HCT 43.8  PLT 206  MCV 89.8  MCH 29.7  MCHC 33.1  RDW 14.0  LYMPHSABS 2.0  MONOABS 1.1*  EOSABS 0.2  BASOSABS 0.1  BANDABS --   ------------------------------------------------------------------------------------------------------------------ Chemistries   Lab 01/29/12 1438  NA 138  K 4.2  CL 99  CO2 32  GLUCOSE 104*  BUN 15  CREATININE 1.24*  CALCIUM 9.7  MG --  AST 18  ALT 11  ALKPHOS 100  BILITOT 0.3   ------------------------------------------------------------------------------------------------------------------ CrCl is unknown because both a height and weight (above a minimum accepted value) are required for this calculation. ------------------------------------------------------------------------------------------------------------------ No results found for this basename:  TSH,T4TOTAL,FREET3,T3FREE,THYROIDAB in the last 72 hours  Coagulation profile  Lab 01/29/12 1438  INR 1.89*  PROTIME --   ------------------------------------------------------------------------------------------------------------------- No results found for this basename: DDIMER:2 in the last 72 hours ------------------------------------------------------------------------------------------------------------------- Cardiac Enzymes  Lab 01/29/12 1438  CKMB 1.6  TROPONINI <0.30  MYOGLOBIN --   ------------------------------------------------------------------------------------------------------------------ No components found with this basename: POCBNP:3 ------------------------------------------------------------------------------------------------------------------  Imaging results:   Ct Head Wo Contrast  01/29/2012  *RADIOLOGY REPORT*  Clinical Data: Code stroke  CT HEAD WITHOUT CONTRAST  Technique:  Contiguous axial images were obtained from the base of the skull through the vertex without contrast.  Comparison: 11/19/2011  Findings: There is diffuse patchy low density throughout the subcortical and periventricular white matter consistent with chronic small vessel ischemic change.  There is prominence of the sulci and ventricles consistent with brain atrophy.  The tubular vascular calcifications within the right occipital and parietal lobes are again noted and appear unchanged from previous exam.  There is no evidence for acute brain infarct, hemorrhage or mass.  The paranasal sinuses and the mastoid air cells appear clear.  Right globe prosthesis noted.  IMPRESSION:  1.  Small vessel ischemic disease and brain atrophy. 2.  No acute intracranial abnormalities.  Original Report Authenticated By: Rosealee Albee, M.D.   Dg Chest Port 1 View  01/29/2012  *RADIOLOGY REPORT*  Clinical Data: Shortness of breath with generalized weakness.  PORTABLE CHEST - 1 VIEW  Comparison: 12/10/2010 and  08/01/2010 radiographs.  Findings: 1532 hours.  The patient is rotated to the right as before. The heart size and mediastinal contours are stable.  The heart remains mildly enlarged.  Pulmonary aeration has improved. There is no focal airspace disease or edema.  There are probable calcified granulomas on the right.  No acute osseous findings are seen.  IMPRESSION: Improved aeration with stable cardiomegaly.  No acute cardiopulmonary process.  Original Report Authenticated By: Gerrianne Scale, M.D.    My personal review of EKG: Rhythm Afib, Rate  70 /min, partial RBBB, few PVCs, no Acute ST changes     Assessment & Plan  1. Aphasia which is almost completely resolved with questionable left-sided facial droop-in a patient with 3 previous CVAs in the past, has atrial fibrillation and is on Coumadin.- We'll admit the patient to telemetry bed, has already been seen by neurologist, CT of the brain stable,  will obtain MRI MRA of the drain, echo, carotid duplex, A1c, lipid panel, patient will be seen by PT OT and speech therapy, neurology to follow tomorrow. We'll add low-dose aspirin if advised by Neurology in am, (she got 325mg  ASA in Er today) continue home dose statin.   2. History of atrial fibrillation goal will be rate controlled patient will be continued on home medications, pharmacy will monitor and dose Coumadin to keep INR between 2-3.   3. History of CAD no acute issues when necessary sublingual nitroglycerin if needed. Patient is currently pain-free. Blood pressure too low for beta blocker.   4. Chronic kidney disease stage IV-and creatinine is around 1.3 appears to be close to baseline we'll monitor, since blood pressure is slightly on the lower side and we'll gently hydrate with IV fluids.   5. History of dyslipidemia will check lipid panel continue home dose statin.   6. Recent use of Levaquin for UTI, current UA is unremarkable Will stop Levaquin.   7. Questionable history of  COPD and remote smoking-quit smoking several years ago, when necessary nebulizers and oxygen, wheezing currently.   8. Chronic diastolic CHF which appears to be compensated, will monitor, last EF is 55% in 2011.     DVT Prophylaxis Coumadin &  SCDs   AM Labs Ordered, also please review Full Orders  Admission, patients condition and plan of care including tests being ordered have been discussed with the patient and son who indicate understanding and agree with the plan and Code Status.  Code Status DNR  Condition Marinell Blight K M.D on 01/29/2012 at 5:03 PM  [facility] Office  [phone]

## 2012-01-29 NOTE — ED Notes (Signed)
Per ems- pt LSN [id number], lives with her family, they noticed she was having difficulty speaking, called EMS. Pt has hx of strokes. EMS reporting right sided facial droop, while in truck speech symptoms resolved and facial droop attributed to having glass eye. Pt alert, oriented to place, talking. Code stroke has since been cancelled.

## 2012-01-29 NOTE — Consult Note (Addendum)
Chief Complaint: "slurred speech"  HPI: Angela [name] is an 78 y.o. female who had a transient slurred speech that resolved by the time of arrival to ED. Patient has a history of multiple TIA's and is on coumadin for atrial fibrillation. No other neurological deficits were noted. NIHSS of 2 secondary to dementia.   LSN: 13:50 pm tPA Given: No: low NIHSS, symptoms resolved. mRankin: 3  Past Medical History  Diagnosis Date  . ASCVD (arteriosclerotic cardiovascular disease)     acute MI in 1993;40%left main,80%LAD, 100%RCA-PTCA; EF of 40%;stress nuclear in2007; normal EF;  mixed inferolateral ischemia and infarction; CHF with normal EF in 2011  . CHF (congestive heart failure)     normal EF;mixed inferolateral ischemic and infarction;chf with normal EF in 2011  . Hypertension   . Hyperlipidemia   . Cerebrovascular disease     CVA by CT in 2007,but not noted in 2009;duplex in 12/2006 plaque without stenosis; Emergency Room evaluation in 12/2010 for transient aphasia and paresis-clopidogrel initially added to ASA Rx, but subsequently warfarin was substituted for these 2 agents  . Tobacco abuse   . Claudication   . Dementia     mild  . Kyphosis     severe  . Decreased hearing   . Atrial fibrillation   . Chronic anticoagulation   . Chronic kidney disease    Past Surgical History  Procedure Date  . Abdominal hysterectomy   . Appendectomy   . Tonsillectomy   . Enucleation     Right-resulted from infection   Family History  Problem Relation Age of Onset  . Dementia Mother   . Atrial fibrillation Son   . Hypertension Son    Social History:  reports that she quit smoking about 18 years ago. She has never used smokeless tobacco. She reports that she does not drink alcohol or use illicit drugs.  Allergies:  Allergies  Allergen Reactions  . Haldol (Haloperidol Decanoate)     Over sedation  . Sulfa Antibiotics Other (See Comments)    uknown  . Sulfonamide Derivatives Other (See  Comments)    unknown   Medications: I have reviewed the patient's current medications.  ROS: As above  Physical Examination: Blood pressure 106/61, pulse 76, temperature 98.2 F (36.8 C), temperature source Oral, resp. rate 12, SpO2 97.00%.  Neurologic Examination: MS: AAO*1, no aphasia, followed simple commands, no neglect CN: right eye prosthesis causing a droopy right eye lid, left eye EOMI, VFF, no face asymmetry, no dysarthria  Motor: no drift, 5/5 strength throughout Sensory: no deficit to LT Coord: F to N intact b/l Reflexes: 1+ throughout, mute plantars Gait: no ataxia  Ct Head Wo Contrast  01/29/2012  *RADIOLOGY REPORT*  Clinical Data: Code stroke  CT HEAD WITHOUT CONTRAST  Technique:  Contiguous axial images were obtained from the base of the skull through the vertex without contrast.  Comparison: 11/19/2011  Findings: There is diffuse patchy low density throughout the subcortical and periventricular white matter consistent with chronic small vessel ischemic change.  There is prominence of the sulci and ventricles consistent with brain atrophy.  The tubular vascular calcifications within the right occipital and parietal lobes are again noted and appear unchanged from previous exam.  There is no evidence for acute brain infarct, hemorrhage or mass.  The paranasal sinuses and the mastoid air cells appear clear.  Right globe prosthesis noted.  IMPRESSION:  1.  Small vessel ischemic disease and brain atrophy. 2.  No acute intracranial abnormalities.  Original  Report Authenticated By: Rosealee Albee, M.D.   Assessment: 78 y.o. female with dementia and multiple medical problems who comes in with transient slurred speech that resolved in ED. She is on Coumadin for atrial fibrillation. INR is pending.   Plan: 1. HgbA1c, fasting lipid panel 2. MRI, MRA  of the brain without contrast 3. PT consult, OT consult, Speech consult 4. Echocardiogram - if intervention for any possible  abnormality is planned 5. Carotid dopplers - if intervention for any possible abnormality is planned 6. Prophylactic therapy-Antiplatelet med: Warfarin 2.5 mg on M, W, F, S and 1.25 mg on T, T, Sat 7. NPO until nursing swallow eval 8. HOB at 30 degrees  Shayley Medlin 01/29/2012, 2:54 PM

## 2012-01-29 NOTE — ED Notes (Signed)
Patient transported to MRI 

## 2012-01-30 LAB — BASIC METABOLIC PANEL
CO2: 29 mEq/L (ref 19–32)
Calcium: 9.4 mg/dL (ref 8.4–10.5)
Chloride: 104 mEq/L (ref 96–112)
Creatinine, Ser: 1.07 mg/dL (ref 0.50–1.10)
Glucose, Bld: 94 mg/dL (ref 70–99)

## 2012-01-30 LAB — CBC
HCT: 41.7 % (ref 36.0–46.0)
MCH: 29.4 pg (ref 26.0–34.0)
MCV: 90.8 fL (ref 78.0–100.0)
Platelets: 213 10*3/uL (ref 150–400)
RDW: 14.2 % (ref 11.5–15.5)

## 2012-01-30 LAB — POCT I-STAT, CHEM 8
BUN: 16 mg/dL (ref 6–23)
Chloride: 100 mEq/L (ref 96–112)
Creatinine, Ser: 1.4 mg/dL — ABNORMAL HIGH (ref 0.50–1.10)
Potassium: 4.1 mEq/L (ref 3.5–5.1)
Sodium: 140 mEq/L (ref 135–145)

## 2012-01-30 LAB — PROTIME-INR: Prothrombin Time: 23 seconds — ABNORMAL HIGH (ref 11.6–15.2)

## 2012-01-30 LAB — LIPID PANEL: HDL: 44 mg/dL (ref >39)

## 2012-01-30 LAB — HEMOGLOBIN A1C
Hgb A1c MFr Bld: 5.7 % — ABNORMAL HIGH (ref <5.7)
Mean Plasma Glucose: 117 mg/dL — ABNORMAL HIGH (ref <117)

## 2012-01-30 MED ORDER — WARFARIN SODIUM 2.5 MG PO TABS
2.5000 mg | ORAL_TABLET | Freq: Once | ORAL | Status: AC
  Administered 2012-01-30: 2.5 mg via ORAL
  Filled 2012-01-30: qty 1

## 2012-01-30 MED ORDER — ZOLPIDEM TARTRATE 5 MG PO TABS: 5.0000 mg | ORAL_TABLET | Freq: Every evening | ORAL | Status: DC | PRN

## 2012-01-30 MED ORDER — QUETIAPINE FUMARATE 25 MG PO TABS
25.0000 mg | ORAL_TABLET | Freq: Two times a day (BID) | ORAL | Status: DC
  Administered 2012-01-30: 25 mg via ORAL
  Filled 2012-01-30 (×3): qty 1

## 2012-01-30 MED ORDER — OLANZAPINE 10 MG PO TBDP
10.0000 mg | ORAL_TABLET | Freq: Two times a day (BID) | ORAL | Status: DC | PRN
  Administered 2012-01-30: 10 mg via ORAL
  Filled 2012-01-30: qty 1

## 2012-01-30 MED ORDER — ZIPRASIDONE MESYLATE 20 MG IM SOLR
10.0000 mg | Freq: Two times a day (BID) | INTRAMUSCULAR | Status: DC | PRN
  Filled 2012-01-30: qty 20

## 2012-01-30 NOTE — Progress Notes (Signed)
  Echocardiogram 2D Echocardiogram has been performed.  Emelia Loron A 01/30/2012, 9:27 AM

## 2012-01-30 NOTE — Progress Notes (Signed)
Per son patient has 24 hr care.  Has a Engineer, site when he is not there.

## 2012-01-30 NOTE — Progress Notes (Signed)
   CARE MANAGEMENT NOTE 01/30/2012  Patient:  Angela, [name]   Account Number:  [id number]  Date Initiated:  01/30/2012  Documentation initiated by:  Letha Cape  Subjective/Objective Assessment:   dx tia/cva  admit- lives with son, Ivar Drape  [phone].     Action/Plan:   pt/ot /st eval   Anticipated DC Date:  01/31/2012   Anticipated DC Plan:  HOME W HOME HEALTH SERVICES      DC Planning Services  CM consult      Choice offered to / List presented to:             Status of service:  In process, will continue to follow Medicare Important Message given?   (If response is "NO", the following Medicare IM given date fields will be blank) Date Medicare IM given:   Date Additional Medicare IM given:    Discharge Disposition:    Per UR Regulation:    If discussed at Long Length of Stay Meetings, dates discussed:    Comments:  01/30/12 10:25 Letha Cape RN, BSN [phone] patient lives with children, patient has medication coverage and transportation.  Await pt/ot/st evals.   Has rolling walker, has home oxygen with [employer]. Patient also has a nebulizer machine NCM will continue to follow for dc needs.  Patient lives in [place], she had an appt today with [facility] for bld draw for coumadin at 4 pm, I canceled appt and rescheduled for Wed 5-1 at 4 pm for patient, she will be seeing Dr. Dietrich Pates.

## 2012-01-30 NOTE — Progress Notes (Signed)
Utilization review completed.  

## 2012-01-30 NOTE — Progress Notes (Signed)
[facility] PROGRESS NOTE    SUBJECTIVE   No complaints.  Does not know where she is or why she was brought here.    OBJECTIVE   Vital signs in last 24 hours: Temp:  [97.6 F (36.4 C)-98.2 F (36.8 C)] 97.6 F (36.4 C) (04/29 4540) Pulse Rate:  [62-78] 67  (04/29 0938) Resp:  [12-20] 20  (04/29 0938) BP: (95-129)/(53-80) 113/71 mmHg (04/29 0938) SpO2:  [93 %-98 %] 93 % (04/29 0938) Weight:  [57.153 kg (126 lb)-61.5 kg (135 lb 9.3 oz)] 61.5 kg (135 lb 9.3 oz) (04/28 2356)  Intake/Output from previous day: 04/28 0701 - 04/29 0700 In: -  Out: 750 [Urine:750] Intake/Output this shift: Total I/O In: 118 [P.O.:118] Out: -  Nutritional status: Cardiac  Past Medical History  Diagnosis Date  . ASCVD (arteriosclerotic cardiovascular disease)     acute MI in 1993;40%left main,80%LAD, 100%RCA-PTCA; EF of 40%;stress nuclear in2007; normal EF;  mixed inferolateral ischemia and infarction; CHF with normal EF in 2011  . CHF (congestive heart failure)     normal EF;mixed inferolateral ischemic and infarction;chf with normal EF in 2011  . Hypertension   . Hyperlipidemia   . Cerebrovascular disease     CVA by CT in 2007,but not noted in 2009;duplex in 12/2006 plaque without stenosis; Emergency Room evaluation in 12/2010 for transient aphasia and paresis-clopidogrel initially added to ASA Rx, but subsequently warfarin was substituted for these 2 agents  . Tobacco abuse   . Claudication   . Dementia     mild  . Kyphosis     severe  . Decreased hearing   . Atrial fibrillation   . Chronic anticoagulation   . Chronic kidney disease     Neurologic Exam:   Mental Status: Alert, not oriented to place, month, year.  Speech fluent without evidence of aphasia. Able to follow 3 step commands without difficulty. Cranial Nerves: II-Visual fields are decreased on the right. III/IV/VI-Extraocular movements intact left eye. Right eye shows  abducens palsy. Ptosis in right eye. Pupils reactive left eye. Ocular prosthesis right eye V/VII-Smile asymmetric with left facial droop and decreased NL fold on left VIII-grossly intact IX/X-normal gag XI-bilateral shoulder shrug XII-midline tongue extension Motor: 5/5 bilaterally with normal tone and bulk Sensory: Pinprick and light touch intact throughout, bilaterally Deep Tendon Reflexes: 1+ and symmetric throughout Plantars: mute bilaterally Cerebellar: Normal finger-to-nose, and normal heel-to-shin test.  Lab Results: Lab Results  Component Value Date/Time   CHOL 140 01/30/2012  6:12 AM   Lipid Panel  Basename 01/30/12 0612  CHOL 140  TRIG 91  HDL 44  CHOLHDL 3.2  VLDL 18  LDLCALC 78   INR - 2.0  HbA1C--5.7  ECG--A-fib with PVC   Studies/Results: Ct Head Wo Contrast  01/29/2012  *RADIOLOGY REPORT*  Clinical Data: Code stroke  CT HEAD WITHOUT CONTRAST  Technique:  Contiguous axial images were obtained from the base of the skull through the vertex without contrast.  Comparison: 11/19/2011  Findings: There is diffuse patchy low density throughout the subcortical and periventricular white matter consistent with chronic small vessel ischemic change.  There is prominence of the sulci and ventricles consistent with brain atrophy.  The tubular vascular calcifications within the right occipital and parietal lobes are again noted and appear  unchanged from previous exam.  There is no evidence for acute brain infarct, hemorrhage or mass.  The paranasal sinuses and the mastoid air cells appear clear.  Right globe prosthesis noted.  IMPRESSION:  1.  Small vessel ischemic disease and brain atrophy. 2.  No acute intracranial abnormalities.  Original Report Authenticated By: Rosealee Albee, M.D.   Mr [facility] Wo Contrast  01/29/2012  *RADIOLOGY REPORT*  Clinical Data:  Atrial fibrillation.  Difficulty speaking. Balanced disturbance.  MRI HEAD WITHOUT CONTRAST MRA HEAD WITHOUT CONTRAST   Technique:  Multiplanar, multiecho pulse sequences of the brain and surrounding structures were obtained without intravenous contrast. Angiographic images of the head were obtained using MRA technique without contrast.  Comparison:  Head CT same day.  MRI 11/19/2011.  MRI HEAD  Findings:  There is a 1 cm focus of acute infarction affecting the left side of the pons.  No other acute infarction.  The cerebellum shows atrophy but no focal infarction.  The cerebral hemispheres show atrophy with extensive chronic small vessel disease throughout the deep white matter.  There is subcortical infarction in the right posterior parietal region.  There are abnormal vessels in that region and in the posterior interhemispheric fissure which are seen to be hyperdense at head CT.  This represents a vascular malformation that is either calcified or has been embolized.  No sign of acute hemorrhage.  No mass lesion, obstructive hydrocephalus or extra-axial collection. No pituitary mass.  No inflammatory sinus disease.  IMPRESSION: 1 cm acute infarction of the left pons.  Atrophy and chronic small vessel disease elsewhere throughout the cerebral hemispheres.  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  See above discussion.  MRA HEAD  Findings: Both internal carotid arteries are patent into the brain. There is pronounced atherosclerotic irregularity and ectasia in the siphon regions, left more than right.  Both anterior and middle cerebral vessels are patent without proximal stenosis, aneurysm more proximal vascular malformation.  I do not see any high flow vessels extending to the region of abnormality in the posterior interhemispheric fissure region and right posterior parietal region.  Both vertebral arteries are patent to the basilar.  No basilar stenosis.  Posterior circulation branch vessels are patent.  IMPRESSION:  No major vessel occlusion or correctable  proximal stenosis. Atherosclerotic ectasia and irregularity in the carotid siphon regions, left more than right.  No high flow vessels seen extending to the region of the posterior interhemispheric fissure or right posterior parietal region.  That area was not completely evaluated using the MRI technique, but the findings suggest that any previous vascular malformation in that region is completely thrombosed or embolized.  Original Report Authenticated By: Thomasenia Sales, M.D.   Mr Brain Wo Contrast  01/29/2012  *RADIOLOGY REPORT*  Clinical Data:  Atrial fibrillation.  Difficulty speaking. Balanced disturbance.  MRI HEAD WITHOUT CONTRAST MRA HEAD WITHOUT CONTRAST  Technique:  Multiplanar, multiecho pulse sequences of the brain and surrounding structures were obtained without intravenous contrast. Angiographic images of the head were obtained using MRA technique without contrast.  Comparison:  Head CT same day.  MRI 11/19/2011.  MRI HEAD  Findings:  There is a 1 cm focus of acute infarction affecting the left side of the pons.  No other acute infarction.  The cerebellum shows atrophy but no focal infarction.  The cerebral hemispheres show atrophy with extensive chronic small vessel disease throughout the deep white matter.  There is subcortical infarction  in the right posterior parietal region.  There are abnormal vessels in that region and in the posterior interhemispheric fissure which are seen to be hyperdense at head CT.  This represents a vascular malformation that is either calcified or has been embolized.  No sign of acute hemorrhage.  No mass lesion, obstructive hydrocephalus or extra-axial collection. No pituitary mass.  No inflammatory sinus disease.  IMPRESSION: 1 cm acute infarction of the left pons.  Atrophy and chronic small vessel disease elsewhere throughout the cerebral hemispheres.  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   See above discussion.    MRA HEAD  Findings: Both internal carotid arteries are patent into the brain. There is pronounced atherosclerotic irregularity and ectasia in the siphon regions, left more than right.  Both anterior and middle cerebral vessels are patent without proximal stenosis, aneurysm more proximal vascular malformation.  I do not see any high flow vessels extending to the region of abnormality in the posterior interhemispheric fissure region and right posterior parietal region.  Both vertebral arteries are patent to the basilar.  No basilar stenosis.  Posterior circulation branch vessels are patent.  IMPRESSION:  No major vessel occlusion or correctable proximal stenosis. Atherosclerotic ectasia and irregularity in the carotid siphon regions, left more than right.  No high flow vessels seen extending to the region of the posterior interhemispheric fissure or right posterior parietal region.  That area was not completely evaluated using the MRI technique, but the findings suggest that any previous vascular malformation in that region is completely thrombosed or embolized.  Original Report Authenticated By: Thomasenia Sales, M.D.   Dg Chest Port 1 View  01/29/2012  *RADIOLOGY REPORT*  Clinical Data: Shortness of breath with generalized weakness.  PORTABLE CHEST - 1 VIEW  Comparison: 12/10/2010 and 08/01/2010 radiographs.  Findings: 1532 hours.  The patient is rotated to the right as before. The heart size and mediastinal contours are stable.  The heart remains mildly enlarged.  Pulmonary aeration has improved. There is no focal airspace disease or edema.  There are probable calcified granulomas on the right.  No acute osseous findings are seen.  IMPRESSION: Improved aeration with stable cardiomegaly.  No acute cardiopulmonary process.  Original Report Authenticated By: Gerrianne Scale, M.D.    2D echo and Doppler pending  Medications:     Scheduled:   . sodium chloride   Intravenous STAT    . aspirin  324 mg Oral Once  . donepezil  5 mg Oral QHS  . LORazepam  0.5 mg Oral Q8H  . memantine  10 mg Oral BID  . mulitivitamin with minerals  1 tablet Oral Daily  . omega-3 acid ethyl esters  1 g Oral Daily  . simvastatin  10 mg Oral q1800  . sodium chloride  3 mL Intravenous Q12H  . vitamin C  500 mg Oral Daily  . warfarin  2.5 mg Oral Once  . warfarin  2.5 mg Oral ONCE-1800  . Warfarin - Pharmacist Dosing Inpatient   Does not apply q1800  . DISCONTD: haloperidol  0.5 mg Oral QHS  . DISCONTD: multivitamin with minerals  1 tablet Oral Daily  . DISCONTD: warfarin  2.5 mg Oral Daily    Assessment/Plan:   78 YO female with new 1 cm acute infarction of the left pons who presented to ED due to transient slurred speech which had resolved at time of ED arrival. Patient has known A-fib with initial INR  1.8 now corrected to 2.0.    2-D echo, carotid dopplers pending  Recommend:  1) continue to keep INR 2-3 2) Continue Statin therapy 3) PT/OT eval     Felicie Morn PA-C [facility] [phone]  01/30/2012, 1:09 PM

## 2012-01-30 NOTE — Progress Notes (Signed)
ANTICOAGULATION CONSULT NOTE - Initial Consult  Pharmacy Consult for warfarin Indication: afib, cva  Allergies  Allergen Reactions  . Haldol (Haloperidol Decanoate)     Over sedation  . Sulfa Antibiotics Other (See Comments)    uknown  . Sulfonamide Derivatives Other (See Comments)    unknown    Patient Measurements: Height: 5\' 5"  (165.1 cm) Weight: 135 lb 9.3 oz (61.5 kg) IBW/kg (Calculated) : 57  Heparin Dosing Weight:   Vital Signs: Temp: 97.6 F (36.4 C) (04/29 0938) Temp src: Oral (04/29 0938) BP: 113/71 mmHg (04/29 0938) Pulse Rate: 67  (04/29 0938)  Labs:  Basename 01/30/12 0612 01/29/12 1445 01/29/12 1438  HGB 13.5 15.3* --  HCT 41.7 45.0 43.8  PLT 213 -- 206  APTT -- -- 41*  LABPROT 23.0* -- 22.0*  INR 2.00* -- 1.89*  HEPARINUNFRC -- -- --  CREATININE 1.07 1.40* 1.24*  CKTOTAL -- -- 47  CKMB -- -- 1.6  TROPONINI -- -- <0.30   Estimated Creatinine Clearance: 32.7 ml/min (by C-G formula based on Cr of 1.07).  Medical History: Past Medical History  Diagnosis Date  . ASCVD (arteriosclerotic cardiovascular disease)     acute MI in 1993;40%left main,80%LAD, 100%RCA-PTCA; EF of 40%;stress nuclear in2007; normal EF;  mixed inferolateral ischemia and infarction; CHF with normal EF in 2011  . CHF (congestive heart failure)     normal EF;mixed inferolateral ischemic and infarction;chf with normal EF in 2011  . Hypertension   . Hyperlipidemia   . Cerebrovascular disease     CVA by CT in 2007,but not noted in 2009;duplex in 12/2006 plaque without stenosis; Emergency Room evaluation in 12/2010 for transient aphasia and paresis-clopidogrel initially added to ASA Rx, but subsequently warfarin was substituted for these 2 agents  . Tobacco abuse   . Claudication   . Dementia     mild  . Kyphosis     severe  . Decreased hearing   . Atrial fibrillation   . Chronic anticoagulation   . Chronic kidney disease     Medications:  Prescriptions prior to admission    Medication Sig Dispense Refill  . alendronate (FOSAMAX) 70 MG tablet Take 70 mg by mouth every 7 (seven) days. Take with a full glass of water on an empty stomach.       . cetirizine (ZYRTEC) 10 MG tablet Take 10 mg by mouth daily.        . Cholecalciferol (VITAMIN D) 1000 UNITS capsule Take 1,000 Units by mouth daily.        . Dietary Management Product (AXONA) packet Take 40 g by mouth daily.        Marland Kitchen donepezil (ARICEPT) 5 MG tablet Take 5 mg by mouth at bedtime.       . furosemide (LASIX) 40 MG tablet Take 20 mg by mouth daily.      . haloperidol (HALDOL) 0.5 MG tablet Take 0.5 mg by mouth at bedtime.      Marland Kitchen levofloxacin (LEVAQUIN) 500 MG tablet Take 500 mg by mouth daily.      Marland Kitchen LORazepam (ATIVAN) 0.5 MG tablet Take 0.5 mg by mouth every 8 (eight) hours.      . memantine (NAMENDA) 10 MG tablet Take 10 mg by mouth 2 (two) times daily.       . Multiple Vitamins-Minerals (MULTIVITAMIN WITH MINERALS) tablet Take 1 tablet by mouth daily.        . nitroGLYCERIN (NITROSTAT) 0.4 MG SL tablet Place 0.4 mg under the  tongue every 5 (five) minutes as needed. For chest pain      . Omega-3 Fatty Acids (FISH OIL) 1000 MG CAPS Take 1,200 mg by mouth 2 (two) times daily.       . pravastatin (PRAVACHOL) 40 MG tablet Take 40 mg by mouth at bedtime.       . vitamin C (ASCORBIC ACID) 500 MG tablet Take 500 mg by mouth daily.      Marland Kitchen warfarin (COUMADIN) 2.5 MG tablet Take 2.5 mg by mouth daily. TAKE 1 TABLET (2.5 MG TOTAL) BY MOUTH DAILY. TAKE 1 TABLET DAILY EXCEPT 1/2 TABLET ON T,TH,SAT      . albuterol-ipratropium (COMBIVENT) 18-103 MCG/ACT inhaler Inhale 2 puffs into the lungs every 6 (six) hours as needed. For shortness of breath        Assessment: 78 yo F with h/o afib and cva . INR slightly less than goal on admission. Today INR = 2.0 is therapeutic.  Home dose was  2.5mg  qMWFSun, 1.25mg  qTTSat.  H/H slight decrease. PLTC stable. No bleeding reported. CT of the brain stable.  Aphasia resolved per Dr.  Thedore Mins.   Goal of Therapy:  INR 2-3   Plan:  Warfarin 2.5 mg po x1 tonight Daily INR  Noah Delaine, RPh 01/30/2012 11:59 AM

## 2012-01-30 NOTE — Progress Notes (Signed)
Patient Demographics  Angela [name], is a 78 y.o. female  UJW:[id number]  FAO:[id number]  DOB - [birth year]  Admit date - 01/29/2012  Admitting Physician Leroy Sea, MD  Outpatient Primary MD for the patient is Cassell Smiles., MD, MD  LOS - 1     Chief Complaint  Patient presents with  . Code Stroke        Subjective:   [name] today has, No headache, No chest pain, No abdominal pain - No Nausea, No new weakness tingling or numbness, No Cough - SOB. He appears to be close to her baseline at this point.  Objective:   Filed Vitals:   01/29/12 2356 01/30/12 0200 01/30/12 0400 01/30/12 0938  BP:  114/53 121/80 113/71  Pulse:  62 69 67  Temp:  97.7 F (36.5 C) 98 F (36.7 C) 97.6 F (36.4 C)  TempSrc:  Oral Oral Oral  Resp:  20 20 20   Height: 5\' 5"  (1.651 m)     Weight: 61.5 kg (135 lb 9.3 oz)     SpO2:  97% 98% 93%    Wt Readings from Last 3 Encounters:  01/29/12 61.5 kg (135 lb 9.3 oz)  11/28/11 60.782 kg (134 lb)  11/18/11 58.4 kg (128 lb 12 oz)     Intake/Output Summary (Last 24 hours) at 01/30/12 1034 Last data filed at 01/30/12 0827  Gross per 24 hour  Intake    118 ml  Output    750 ml  Net   -632 ml    Exam Awake Alert, Oriented *3, No new F.N deficits, Normal affect [place].AT,PERRAL Supple Neck,No JVD, No cervical lymphadenopathy appriciated.  Symmetrical Chest wall movement, Good air movement bilaterally, CTAB RRR,No Gallops,Rubs or new Murmurs, No Parasternal Heave +ve B.Sounds, Abd Soft, Non tender, No organomegaly appriciated, No rebound -guarding or rigidity. No Cyanosis, Clubbing or edema, No new Rash or bruise   Data Review  CBC  Lab 01/30/12 0612 01/29/12 1445 01/29/12 1438  WBC 8.4 -- 11.7*  HGB 13.5 15.3* 14.5  HCT 41.7 45.0 43.8  PLT 213 -- 206  MCV 90.8 -- 89.8  MCH 29.4 -- 29.7  MCHC 32.4 -- 33.1  RDW 14.2 -- 14.0  LYMPHSABS -- -- 2.0  MONOABS -- -- 1.1*  EOSABS -- -- 0.2  BASOSABS -- -- 0.1  BANDABS -- -- --      Chemistries   Lab 01/30/12 0612 01/29/12 1445 01/29/12 1438  NA 141 140 138  K 4.0 4.1 4.2  CL 104 100 99  CO2 29 -- 32  GLUCOSE 94 103* 104*  BUN 13 16 15   CREATININE 1.07 1.40* 1.24*  CALCIUM 9.4 -- 9.7  MG -- -- --  AST -- -- 18  ALT -- -- 11  ALKPHOS -- -- 100  BILITOT -- -- 0.3   ------------------------------------------------------------------------------------------------------------------ estimated creatinine clearance is 32.7 ml/min (by C-G formula based on Cr of 1.07). ------------------------------------------------------------------------------------------------------------------ No results found for this basename: HGBA1C:2 in the last 72 hours ------------------------------------------------------------------------------------------------------------------  [facility] 01/30/12 0612  CHOL 140  HDL 44  LDLCALC 78  TRIG 91  CHOLHDL 3.2  LDLDIRECT --   ------------------------------------------------------------------------------------------------------------------ No results found for this basename: TSH,T4TOTAL,FREET3,T3FREE,THYROIDAB in the last 72 hours ------------------------------------------------------------------------------------------------------------------ No results found for this basename: VITAMINB12:2,FOLATE:2,FERRITIN:2,TIBC:2,IRON:2,RETICCTPCT:2 in the last 72 hours  Coagulation profile  Lab 01/30/12 0612 01/29/12 1438  INR 2.00* 1.89*  PROTIME -- --    No results found for this basename: DDIMER:2 in the last 72 hours  Cardiac Enzymes  Lab 01/29/12 1438  CKMB 1.6  TROPONINI <0.30  MYOGLOBIN --   ------------------------------------------------------------------------------------------------------------------ No components found with this basename: POCBNP:3  Micro Results No results found for this or any previous visit (from the past 240 hour(s)).  Radiology Reports Ct Head Wo Contrast  01/29/2012  *RADIOLOGY REPORT*  Clinical  Data: Code stroke  CT HEAD WITHOUT CONTRAST  Technique:  Contiguous axial images were obtained from the base of the skull through the vertex without contrast.  Comparison: 11/19/2011  Findings: There is diffuse patchy low density throughout the subcortical and periventricular white matter consistent with chronic small vessel ischemic change.  There is prominence of the sulci and ventricles consistent with brain atrophy.  The tubular vascular calcifications within the right occipital and parietal lobes are again noted and appear unchanged from previous exam.  There is no evidence for acute brain infarct, hemorrhage or mass.  The paranasal sinuses and the mastoid air cells appear clear.  Right globe prosthesis noted.  IMPRESSION:  1.  Small vessel ischemic disease and brain atrophy. 2.  No acute intracranial abnormalities.  Original Report Authenticated By: Rosealee Albee, M.D.   [facility] Wo Contrast  01/29/2012  *RADIOLOGY REPORT*  Clinical Data:  Atrial fibrillation.  Difficulty speaking. Balanced disturbance.  MRI HEAD WITHOUT CONTRAST MRA HEAD WITHOUT CONTRAST  Technique:  Multiplanar, multiecho pulse sequences of the brain and surrounding structures were obtained without intravenous contrast. Angiographic images of the head were obtained using MRA technique without contrast.  Comparison:  Head CT same day.  MRI 11/19/2011.  MRI HEAD  Findings:  There is a 1 cm focus of acute infarction affecting the left side of the pons.  No other acute infarction.  The cerebellum shows atrophy but no focal infarction.  The cerebral hemispheres show atrophy with extensive chronic small vessel disease throughout the deep white matter.  There is subcortical infarction in the right posterior parietal region.  There are abnormal vessels in that region and in the posterior interhemispheric fissure which are seen to be hyperdense at head CT.  This represents a vascular malformation that is either calcified or has been  embolized.  No sign of acute hemorrhage.  No mass lesion, obstructive hydrocephalus or extra-axial collection. No pituitary mass.  No inflammatory sinus disease.  IMPRESSION: 1 cm acute infarction of the left pons.  Atrophy and chronic small vessel disease elsewhere throughout the cerebral hemispheres.  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  See above discussion.  MRA HEAD  Findings: Both internal carotid arteries are patent into the brain. There is pronounced atherosclerotic irregularity and ectasia in the siphon regions, left more than right.  Both anterior and middle cerebral vessels are patent without proximal stenosis, aneurysm more proximal vascular malformation.  I do not see any high flow vessels extending to the region of abnormality in the posterior interhemispheric fissure region and right posterior parietal region.  Both vertebral arteries are patent to the basilar.  No basilar stenosis.  Posterior circulation branch vessels are patent.  IMPRESSION:  No major vessel occlusion or correctable proximal stenosis. Atherosclerotic ectasia and irregularity in the carotid siphon regions, left more than right.  No high flow vessels seen extending to the region of the posterior interhemispheric fissure or right posterior parietal region.  That area was not completely evaluated using the MRI technique, but the findings suggest that any previous vascular malformation in that region is completely thrombosed or embolized.  Original Report  Authenticated By: Thomasenia Sales, M.D.   Mr Brain Wo Contrast  01/29/2012  *RADIOLOGY REPORT*  Clinical Data:  Atrial fibrillation.  Difficulty speaking. Balanced disturbance.  MRI HEAD WITHOUT CONTRAST MRA HEAD WITHOUT CONTRAST  Technique:  Multiplanar, multiecho pulse sequences of the brain and surrounding structures were obtained without intravenous contrast. Angiographic images of the head were  obtained using MRA technique without contrast.  Comparison:  Head CT same day.  MRI 11/19/2011.  MRI HEAD  Findings:  There is a 1 cm focus of acute infarction affecting the left side of the pons.  No other acute infarction.  The cerebellum shows atrophy but no focal infarction.  The cerebral hemispheres show atrophy with extensive chronic small vessel disease throughout the deep white matter.  There is subcortical infarction in the right posterior parietal region.  There are abnormal vessels in that region and in the posterior interhemispheric fissure which are seen to be hyperdense at head CT.  This represents a vascular malformation that is either calcified or has been embolized.  No sign of acute hemorrhage.  No mass lesion, obstructive hydrocephalus or extra-axial collection. No pituitary mass.  No inflammatory sinus disease.  IMPRESSION: 1 cm acute infarction of the left pons.  Atrophy and chronic small vessel disease elsewhere throughout the cerebral hemispheres.  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  See above discussion.  MRA HEAD  Findings: Both internal carotid arteries are patent into the brain. There is pronounced atherosclerotic irregularity and ectasia in the siphon regions, left more than right.  Both anterior and middle cerebral vessels are patent without proximal stenosis, aneurysm more proximal vascular malformation.  I do not see any high flow vessels extending to the region of abnormality in the posterior interhemispheric fissure region and right posterior parietal region.  Both vertebral arteries are patent to the basilar.  No basilar stenosis.  Posterior circulation branch vessels are patent.  IMPRESSION:  No major vessel occlusion or correctable proximal stenosis. Atherosclerotic ectasia and irregularity in the carotid siphon regions, left more than right.  No high flow vessels seen extending to the region of the  posterior interhemispheric fissure or right posterior parietal region.  That area was not completely evaluated using the MRI technique, but the findings suggest that any previous vascular malformation in that region is completely thrombosed or embolized.  Original Report Authenticated By: Thomasenia Sales, M.D.   Dg Chest Port 1 View  01/29/2012  *RADIOLOGY REPORT*  Clinical Data: Shortness of breath with generalized weakness.  PORTABLE CHEST - 1 VIEW  Comparison: 12/10/2010 and 08/01/2010 radiographs.  Findings: 1532 hours.  The patient is rotated to the right as before. The heart size and mediastinal contours are stable.  The heart remains mildly enlarged.  Pulmonary aeration has improved. There is no focal airspace disease or edema.  There are probable calcified granulomas on the right.  No acute osseous findings are seen.  IMPRESSION: Improved aeration with stable cardiomegaly.  No acute cardiopulmonary process.  Original Report Authenticated By: Gerrianne Scale, M.D.    Scheduled Meds:   . sodium chloride   Intravenous STAT  . aspirin  324 mg Oral Once  . donepezil  5 mg Oral QHS  . LORazepam  0.5 mg Oral Q8H  . memantine  10 mg Oral BID  . mulitivitamin with minerals  1 tablet Oral Daily  . omega-3 acid ethyl esters  1 g Oral Daily  . simvastatin  10  mg Oral q1800  . sodium chloride  3 mL Intravenous Q12H  . vitamin C  500 mg Oral Daily  . warfarin  2.5 mg Oral Once  . Warfarin - Pharmacist Dosing Inpatient   Does not apply q1800  . DISCONTD: haloperidol  0.5 mg Oral QHS  . DISCONTD: multivitamin with minerals  1 tablet Oral Daily  . DISCONTD: warfarin  2.5 mg Oral Daily   Continuous Infusions:   . sodium chloride 75 mL/hr at 01/30/12 0500   PRN Meds:.albuterol, guaiFENesin-dextromethorphan, HYDROcodone-acetaminophen, nitroGLYCERIN, ondansetron (ZOFRAN) IV, ondansetron, senna-docusate, zolpidem  Assessment & Plan   1. 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 We will deferred aspirin/Plavix to neurology if needed, continue home dose statin.    2. History of atrial fibrillation goal will be rate controlled patient will be continued on home medications, pharmacy will monitor and dose Coumadin to keep INR between 2-3. He should in son has been counseled on the risks of Coumadin in a patient with poor balance, son states that this has been a long-standing issue and they have been counseled by the cardiologist and primary care physician also, patient has been advised to use walker at all times and full fall precautions.   Lab Results  Component Value Date   INR 2.00* 01/30/2012   INR 1.89* 01/29/2012   INR 2.2 01/04/2012      3. History of CAD no acute issues when necessary sublingual nitroglycerin if needed. Patient is currently pain-free. Outpatient addition of beta blocker can be considered provided her blood pressures remained in stable range. She has had issues with low blood pressure in the past.    4. Chronic kidney disease stage IV-and creatinine is around 1.3 appears to be close to baseline we'll monitor, stable post gentle hydrate with IV fluids, and improved. Outpatient monitoring.    5. History of dyslipidemia will check lipid panel continue home dose statin.     6. Recent use of Levaquin for UTI, current UA is unremarkable Will stop Levaquin.     7. Questionable history of COPD and remote smoking-quit smoking several years ago, when necessary nebulizers and oxygen, wheezing currently.     8. Chronic diastolic CHF which appears to be compensated, will monitor, last EF is  55% in 2011.    DVT Prophylaxis Coumadin & SCDs        Leroy Sea M.D on 01/30/2012 at 10:34 AM  [facility] Office  [phone]

## 2012-01-30 NOTE — Evaluation (Signed)
Physical Therapy Evaluation Patient Details Name: Angela [name] MRN: [id number] DOB: 1921/06/14 Today's Date: 01/30/2012 Time: 4098-1191 PT Time Calculation (min): 25 min  PT Assessment / Plan / Recommendation Clinical Impression  78 y.o. female admitted to [facility] due to slurred speech and difficulty walking diagnosed with new acute infarction of the left pons.  She presents today with difficulty walking, decreased cognition, decreased balance, decreased safety awareness and decreased awareness of deficits.  She has some mild decrease in her leg strength right compared to left.  She would benefit from acute PT to maximize her independence, functional mobility and safety so that she  may return home with 24 hour assist safely at discharge.      PT Assessment  Patient needs continued PT services    Follow Up Recommendations  Home health PT;Supervision/Assistance - 24 hour (son reports only if covered by insurance)    Equipment Recommendations  None recommended by PT    Frequency Min 4X/week    Precautions / Restrictions Precautions Precautions: Fall Precaution Comments: has a prosthetic right eye.   Restrictions Weight Bearing Restrictions: No   Pertinent Vitals/Pain No reports of pain.        Mobility  Transfers Transfers: Sit to Stand;Stand to Sit Sit to Stand: 4: Min assist;With upper extremity assist;With armrests;From chair/3-in-1 Stand to Sit: 4: Min assist;To chair/3-in-1;With armrests;With upper extremity assist Details for Transfer Assistance: min asssit to steady trunk for balance and assist patient to her feet.  Mild posterior lean during transition.    Ambulation/Gait Ambulation/Gait Assistance: 4: Min assist Ambulation Distance (Feet): 120 Feet Assistive device: Rolling walker Ambulation/Gait Assistance Details: min assist for 2-3 LOB during gait even with RW.  Increased balance deficits while walking and talking or walking and looking at staff in the hallway.     Gait Pattern: Shuffle;Step-through pattern;Trunk flexed Gait velocity: <1.8 ft/sec which puts her at high risk  for recurrent falls.      Exercises General Exercises - Upper Extremity Shoulder Flexion: AROM;Both;10 reps General Exercises - Lower Extremity Ankle Circles/Pumps: AROM;Both;10 reps Long Arc Quad: AROM;Both;10 reps Straight Leg Raises: AROM;Both;10 reps   PT Goals Acute Rehab PT Goals PT Goal Formulation: With family Time For Goal Achievement: 02/13/12 Potential to Achieve Goals: Good Pt will go Supine/Side to Sit: with modified independence;with HOB 0 degrees PT Goal: Supine/Side to Sit - Progress: Goal set today Pt will go Sit to Supine/Side: with modified independence;with HOB 0 degrees PT Goal: Sit to Supine/Side - Progress: Goal set today Pt will go Sit to Stand: with supervision PT Goal: Sit to Stand - Progress: Goal set today Pt will go Stand to Sit: with supervision PT Goal: Stand to Sit - Progress: Goal set today Pt will Transfer Bed to Chair/Chair to Bed: with supervision PT Transfer Goal: Bed to Chair/Chair to Bed - Progress: Goal set today Pt will Ambulate: >150 feet;with supervision;with rolling walker PT Goal: Ambulate - Progress: Goal set today  Visit Information  Last PT Received On: 01/30/12 Assistance Needed: +1    Subjective Data  Subjective: "I don't know" Re: where she is or why she is here.   Patient Stated Goal: to go home   Prior Functioning  Home Living Lives With: Son Available Help at Discharge: Family (aid 5 days per week for 5 hours per day.  Son there the rest) Type of Home: Mobile home Home Access: Ramped entrance Home Layout: One level Bathroom Shower/Tub: Tub/shower unit;[employer]: Standard Home Adaptive Equipment: Grab bars  in shower;Walker - rolling;Wheelchair - manual;Hospital bed;Shower chair with back (O2 at home) Additional Comments: Son reports that patient is resistant to using RW at home.  He has to  hold onto her when she walks.   Prior Function Level of Independence: Needs assistance Needs Assistance: Bathing;Dressing;Grooming;Toileting;Meal Prep;Light Housekeeping;Gait;Transfers Bath: Moderate Dressing: Moderate Grooming: Moderate Toileting: Minimal Meal Prep: Total Light Housekeeping: Total Gait Assistance: min hand held assist Transfer Assistance: min assist Driving: No Communication Communication: No difficulties    Cognition  Overall Cognitive Status: History of cognitive impairments - at baseline Arousal/Alertness: Awake/alert Orientation Level: Person Behavior During Session: Restless Cognition - Other Comments: Decreased STM.  Recall is < 3 mins    Extremity/Trunk Assessment Right Upper Extremity Assessment RUE ROM/Strength/Tone: Within functional levels Left Upper Extremity Assessment LUE ROM/Strength/Tone: Within functional levels Right Lower Extremity Assessment RLE ROM/Strength/Tone: Deficits RLE ROM/Strength/Tone Deficits: 3+/5 ankle, knee, hip Left Lower Extremity Assessment LLE ROM/Strength/Tone: Deficits LLE ROM/Strength/Tone Deficits: 4/5 ankle, knee, hip   End of Session PT - End of Session Equipment Utilized During Treatment: Gait belt Activity Tolerance: Patient limited by fatigue Patient left: in chair;with call bell/phone within reach   [place] B. Edlin Ford, PT, DPT [phone] 01/30/2012, 4:08 PM

## 2012-01-30 NOTE — Progress Notes (Signed)
VASCULAR LAB PRELIMINARY  PRELIMINARY  PRELIMINARY  PRELIMINARY  Carotid duplex  completed.    Preliminary report:  Bilateral: No evidence of hemodynamically significant internal carotid artery stenosis.  Right:  Vertebral artery  retrograde.  Left: Vertebral artery flow is antegrade.    Terance Hart, 01/30/2012, 1:36 PM

## 2012-01-30 NOTE — Progress Notes (Signed)
Attempted evaluation of cognition/speech/language. Pt in procedure. Will re-attempt. Also of note - MRI shows pontine CVA putting pt at risk of silent aspiration. If concerns arise consider ordering clinical swallow evaluation even though pt has passed the RN stroke swallow screen. Harlon Ditty, MA CCC-SLP [phone]

## 2012-01-31 LAB — PROTIME-INR: INR: 2.36 — ABNORMAL HIGH (ref 0.00–1.49)

## 2012-01-31 MED ORDER — WARFARIN 1.25 MG HALF TABLET
1.2500 mg | ORAL_TABLET | Freq: Once | ORAL | Status: DC
  Filled 2012-01-31: qty 1

## 2012-01-31 NOTE — Discharge Summary (Signed)
Angela [name] RUEA, 78 y.o., is a 78 y.o. female  DOB [birth year]  MRN [id number].  Admission date: 01/29/2012  Discharge Date 01/31/2012  Primary MD Cassell Smiles., MD, MD  Admitting Physician Angela Sea, MD  Admission Diagnosis  TIA (transient ischemic attack) [435.9] TIA  Discharge Diagnosis   Principal Problem:  *TIA (transient ischemic attack) Active Problems:  HYPERLIPIDEMIA  DEMENTIA  HYPERTENSION  KYPHOSIS  Atrial fibrillation  Chronic anticoagulation  Cerebrovascular disease  ASCVD (arteriosclerotic cardiovascular disease)  CHF (congestive heart failure)  Tobacco abuse, in remission  UTI (lower urinary tract infection)  Prosthetic eye globe    Past Medical History  Diagnosis Date  . ASCVD (arteriosclerotic cardiovascular disease)     acute MI in 1993;40%left main,80%LAD, 100%RCA-PTCA; EF of 40%;stress nuclear in2007; normal EF;  mixed inferolateral ischemia and infarction; CHF with normal EF in 2011  . CHF (congestive heart failure)     normal EF;mixed inferolateral ischemic and infarction;chf with normal EF in 2011  . Hypertension   . Hyperlipidemia   . Cerebrovascular disease     CVA by CT in 2007,but not noted in 2009;duplex in 12/2006 plaque without stenosis; Emergency Room evaluation in 12/2010 for transient aphasia and paresis-clopidogrel initially added to ASA Rx, but subsequently warfarin was substituted for these 2 agents  . Tobacco abuse   . Claudication   . Dementia     mild  . Kyphosis     severe  . Decreased hearing   . Atrial fibrillation   . Chronic anticoagulation   . Chronic kidney disease     Past Surgical History  Procedure Date  . Abdominal hysterectomy   . Appendectomy   . Tonsillectomy   . Enucleation     Right-resulted from infection     Hospital Course See H&P, Labs, Consult and Test reports for all details in brief, patient was admitted for  -   1. Aphasia which is almost completely resolved with questionable  left-sided facial droop-in a patient with 3 previous CVAs in the past, has atrial fibrillation and is on Coumadin.- We'll admit the patient to telemetry bed, has already been seen by neurologist, CT of the brain stable, will obtain MRI MRA of the drain, echo, carotid duplex, A1c, lipid panel, patient will be seen by PT OT and speech therapy, neurology to follow tomorrow. We'll add low-dose aspirin if advised by Neurology in am, (she got 325mg  ASA in Er today) continue home dose statin.    Lab Results  Component Value Date   HGBA1C 5.7* 01/30/2012    Lab Results  Component Value Date   CHOL 140 01/30/2012   HDL 44 01/30/2012   LDLCALC 78 01/30/2012   TRIG 91 01/30/2012   CHOLHDL 3.2 01/30/2012    2. History of atrial fibrillation goal will be rate controlled patient will be continued on home medications, continue to monitor and dose Coumadin to keep INR between 2-3.   Lab Results  Component Value Date   INR 2.36* 01/31/2012   INR 2.00* 01/30/2012   INR 1.89* 01/29/2012     3. History of CAD no acute issues when necessary sublingual nitroglycerin if needed. Patient is currently pain-free. Blood pressure too low for beta blocker.    4. Chronic kidney disease stage IV-and creatinine is around 1.3 appears to be close to baseline we'll monitor, since blood pressure is slightly on the lower side she is post gentle IV fluids.    5. History of dyslipidemia will check lipid panel continue  home dose statin.    6. Recent use of Levaquin for UTI, current UA is unremarkable Will stop Levaquin.    7. Questionable history of COPD and remote smoking-quit smoking several years ago, when necessary nebulizers and oxygen, wheezing currently.    8. Chronic diastolic CHF which appears to be compensated, will monitor, last EF is 55% in 2011.       Consults Neurology  Significant Tests:  See full reports for all details     Ct Head Wo Contrast  01/29/2012  *RADIOLOGY REPORT*  Clinical Data: Code  stroke  CT HEAD WITHOUT CONTRAST  Technique:  Contiguous axial images were obtained from the base of the skull through the vertex without contrast.  Comparison: 11/19/2011  Findings: There is diffuse patchy low density throughout the subcortical and periventricular white matter consistent with chronic small vessel ischemic change.  There is prominence of the sulci and ventricles consistent with brain atrophy.  The tubular vascular calcifications within the right occipital and parietal lobes are again noted and appear unchanged from previous exam.  There is no evidence for acute brain infarct, hemorrhage or mass.  The paranasal sinuses and the mastoid air cells appear clear.  Right globe prosthesis noted.  IMPRESSION:  1.  Small vessel ischemic disease and brain atrophy. 2.  No acute intracranial abnormalities.  Original Report Authenticated By: Rosealee Albee, M.D.   Mr [facility] [place] Wo Contrast  01/29/2012  *RADIOLOGY REPORT*  Clinical Data:  Atrial fibrillation.  Difficulty speaking. Balanced disturbance.  MRI HEAD WITHOUT CONTRAST MRA HEAD WITHOUT CONTRAST  Technique:  Multiplanar, multiecho pulse sequences of the brain and surrounding structures were obtained without intravenous contrast. Angiographic images of the head were obtained using MRA technique without contrast.  Comparison:  Head CT same day.  MRI 11/19/2011.  MRI HEAD  Findings:  There is a 1 cm focus of acute infarction affecting the left side of the pons.  No other acute infarction.  The cerebellum shows atrophy but no focal infarction.  The cerebral hemispheres show atrophy with extensive chronic small vessel disease throughout the deep white matter.  There is subcortical infarction in the right posterior parietal region.  There are abnormal vessels in that region and in the posterior interhemispheric fissure which are seen to be hyperdense at head CT.  This represents a vascular malformation that is either calcified or has been embolized.  No sign  of acute hemorrhage.  No mass lesion, obstructive hydrocephalus or extra-axial collection. No pituitary mass.  No inflammatory sinus disease.  IMPRESSION: 1 cm acute infarction of the left pons.  Atrophy and chronic small vessel disease elsewhere throughout the cerebral hemispheres.  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  See above discussion.  MRA HEAD  Findings: Both internal carotid arteries are patent into the brain. There is pronounced atherosclerotic irregularity and ectasia in the siphon regions, left more than right.  Both anterior and middle cerebral vessels are patent without proximal stenosis, aneurysm more proximal vascular malformation.  I do not see any high flow vessels extending to the region of abnormality in the posterior interhemispheric fissure region and right posterior parietal region.  Both vertebral arteries are patent to the basilar.  No basilar stenosis.  Posterior circulation branch vessels are patent.  IMPRESSION:  No major vessel occlusion or correctable proximal stenosis. Atherosclerotic ectasia and irregularity in the carotid siphon regions, left more than right.  No high flow vessels seen extending to the  region of the posterior interhemispheric fissure or right posterior parietal region.  That area was not completely evaluated using the MRI technique, but the findings suggest that any previous vascular malformation in that region is completely thrombosed or embolized.  Original Report Authenticated By: Thomasenia Sales, M.D.   Mr Brain Wo Contrast  01/29/2012  *RADIOLOGY REPORT*  Clinical Data:  Atrial fibrillation.  Difficulty speaking. Balanced disturbance.  MRI HEAD WITHOUT CONTRAST MRA HEAD WITHOUT CONTRAST  Technique:  Multiplanar, multiecho pulse sequences of the brain and surrounding structures were obtained without intravenous contrast. Angiographic images of the head were obtained using MRA  technique without contrast.  Comparison:  Head CT same day.  MRI 11/19/2011.  MRI HEAD  Findings:  There is a 1 cm focus of acute infarction affecting the left side of the pons.  No other acute infarction.  The cerebellum shows atrophy but no focal infarction.  The cerebral hemispheres show atrophy with extensive chronic small vessel disease throughout the deep white matter.  There is subcortical infarction in the right posterior parietal region.  There are abnormal vessels in that region and in the posterior interhemispheric fissure which are seen to be hyperdense at head CT.  This represents a vascular malformation that is either calcified or has been embolized.  No sign of acute hemorrhage.  No mass lesion, obstructive hydrocephalus or extra-axial collection. No pituitary mass.  No inflammatory sinus disease.  IMPRESSION: 1 cm acute infarction of the left pons.  Atrophy and chronic small vessel disease elsewhere throughout the cerebral hemispheres.  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  See above discussion.  MRA HEAD  Findings: Both internal carotid arteries are patent into the brain. There is pronounced atherosclerotic irregularity and ectasia in the siphon regions, left more than right.  Both anterior and middle cerebral vessels are patent without proximal stenosis, aneurysm more proximal vascular malformation.  I do not see any high flow vessels extending to the region of abnormality in the posterior interhemispheric fissure region and right posterior parietal region.  Both vertebral arteries are patent to the basilar.  No basilar stenosis.  Posterior circulation branch vessels are patent.  IMPRESSION:  No major vessel occlusion or correctable proximal stenosis. Atherosclerotic ectasia and irregularity in the carotid siphon regions, left more than right.  No high flow vessels seen extending to the region of the posterior  interhemispheric fissure or right posterior parietal region.  That area was not completely evaluated using the MRI technique, but the findings suggest that any previous vascular malformation in that region is completely thrombosed or embolized.  Original Report Authenticated By: Thomasenia Sales, M.D.   Dg Chest Port 1 View  01/29/2012  *RADIOLOGY REPORT*  Clinical Data: Shortness of breath with generalized weakness.  PORTABLE CHEST - 1 VIEW  Comparison: 12/10/2010 and 08/01/2010 radiographs.  Findings: 1532 hours.  The patient is rotated to the right as before. The heart size and mediastinal contours are stable.  The heart remains mildly enlarged.  Pulmonary aeration has improved. There is no focal airspace disease or edema.  There are probable calcified granulomas on the right.  No acute osseous findings are seen.  IMPRESSION: Improved aeration with stable cardiomegaly.  No acute cardiopulmonary process.  Original Report Authenticated By: Gerrianne Scale, M.D.     Today   Subjective:   Angela [name] today has no headache,no chest abdominal pain,no new weakness tingling or numbness, feels much better wants to go  home today.    Objective:   Blood pressure 97/67, pulse 84, temperature 98.1 F (36.7 C), temperature source Oral, resp. rate 19, height 5\' 5"  (1.651 m), weight 60.2 kg (132 lb 11.5 oz), SpO2 93.00%.  Intake/Output Summary (Last 24 hours) at 01/31/12 1025 Last data filed at 01/31/12 0100  Gross per 24 hour  Intake    236 ml  Output   1200 ml  Net   -964 ml    Exam Awake Alert, Oriented *3, No new F.N deficits, Normal affect [place].AT,PERRAL Supple Neck,No JVD, No cervical lymphadenopathy appriciated.  Symmetrical Chest wall movement, Good air movement bilaterally, CTAB RRR,No Gallops,Rubs or new Murmurs, No Parasternal Heave +ve B.Sounds, Abd Soft, Non tender, No organomegaly appriciated, No rebound -guarding or rigidity. No Cyanosis, Clubbing or edema, No new Rash or  bruise  Data Review      CBC w Diff: Lab Results  Component Value Date   WBC 8.4 01/30/2012   HGB 13.5 01/30/2012   HCT 41.7 01/30/2012   PLT 213 01/30/2012   LYMPHOPCT 17 01/29/2012   MONOPCT 10 01/29/2012   EOSPCT 2 01/29/2012   BASOPCT 1 01/29/2012   CMP: Lab Results  Component Value Date   NA 141 01/30/2012   K 4.0 01/30/2012   CL 104 01/30/2012   CO2 29 01/30/2012   BUN 13 01/30/2012   CREATININE 1.07 01/30/2012   CREATININE 1.32* 12/05/2011   PROT 6.4 01/29/2012   ALBUMIN 3.5 01/29/2012   BILITOT 0.3 01/29/2012   ALKPHOS 100 01/29/2012   AST 18 01/29/2012   ALT 11 01/29/2012  .  Micro Results No results found for this or any previous visit (from the past 240 hour(s)).   Discharge Instructions       Follow with Primary MD Cassell Smiles., MD, MD in 3 days..  Get CBC, CMP, INR checked 3 days by Primary MD and again as instructed by your Primary MD.   Get Medicines reviewed and adjusted.  Please request your Prim.MD to go over all Hospital Tests and Procedure/Radiological results at the follow up, please get all Hospital records sent to your Prim MD by signing hospital release before you go home.  Activity: Fall precautions use walker/cane & assistance as needed  Diet: Heart Healthy, Aspiration precautions.  For Heart failure patients - Check your Weight same time everyday, if you gain over 2 pounds, or you develop in leg swelling, experience more shortness of breath or chest pain, call your Primary MD immediately. Follow Cardiac Low Salt Diet and 1.8 lit/day fluid restriction.  Disposition Home  If you experience worsening of your admission symptoms, develop shortness of breath, life threatening emergency, suicidal or homicidal thoughts you must seek medical attention immediately by calling 911 or calling your MD immediately  if symptoms less severe.  You Must read complete instructions/literature along with all the possible adverse reactions/side effects for all the  Medicines you take and that have been prescribed to you. Take any new Medicines after you have completely understood and accpet all the possible adverse reactions/side effects.   Do not drive if your were admitted for syncope or siezures until you have seen by Primary MD or a Neurologist and advised to drive.  Do not drive when taking Pain medications.    Do not take more than prescribed Pain, Sleep and Anxiety Medications  Special Instructions: If you have smoked or chewed Tobacco  in the last 2 yrs please stop smoking, stop any regular Alcohol  and or  any Recreational drug use.  Wear Seat belts while driving.   Follow-up Information    Follow up with Negley Bing, MD on 02/01/2012. (at 4 pm for coumadin draw)    Contact information:   [address] [place] [address] [phone]       Follow up with Gates Rigg, MD. Schedule an appointment as soon as possible for a visit in 2 weeks.   Contact information:   [address], [address] [facility] [place] [address] [phone]          Discharge Medications   Medication List  As of 01/31/2012 10:25 AM   CONTINUE taking these medications         alendronate 70 MG tablet   Commonly known as: FOSAMAX      AXONA packet      cetirizine 10 MG tablet   Commonly known as: ZYRTEC      COMBIVENT 18-103 MCG/ACT inhaler   Generic drug: albuterol-ipratropium      donepezil 5 MG tablet   Commonly known as: ARICEPT      Fish Oil 1000 MG Caps      furosemide 40 MG tablet   Commonly known as: LASIX      haloperidol 0.5 MG tablet   Commonly known as: HALDOL      LORazepam 0.5 MG tablet   Commonly known as: ATIVAN      memantine 10 MG tablet   Commonly known as: NAMENDA      multivitamin with minerals tablet      nitroGLYCERIN 0.4 MG SL tablet   Commonly known as: NITROSTAT      pravastatin 40 MG tablet   Commonly known as: PRAVACHOL      vitamin C 500 MG  tablet   Commonly known as: ASCORBIC ACID      Vitamin D 1000 UNITS capsule      warfarin 2.5 MG tablet   Commonly known as: COUMADIN         STOP taking these medications         levofloxacin 500 MG tablet             Total Time in preparing paper work, data evaluation and todays exam - 35 minutes  Susa Raring K M.D on 01/31/2012 at 10:25 AM  [facility] Office  [phone]

## 2012-01-31 NOTE — Progress Notes (Signed)
Physical Therapy Treatment Patient Details Name: Angela [name] MRN: [id number] DOB: 03/05/1921 Today's Date: 01/31/2012 Time: 4098-1191 PT Time Calculation (min): 11 min  PT Assessment / Plan / Recommendation Comments on Treatment Session  Pt progressing well, although still requires assistance during all mobility. Plan for pt to d/c home today, spoke extensively with son regarding pt's need for assistance at home and that she cannot be independent yet.     Follow Up Recommendations  Home health PT;Supervision/Assistance - 24 hour    Equipment Recommendations  None recommended by PT    Frequency Min 4X/week   Plan Discharge plan remains appropriate;Frequency remains appropriate    Precautions / Restrictions Precautions Precautions: Fall Restrictions Weight Bearing Restrictions: No       Mobility  Bed Mobility Bed Mobility: Supine to Sit;Sitting - Scoot to Edge of Bed Supine to Sit: 3: Mod assist Sitting - Scoot to Edge of Bed: 4: Min assist Details for Bed Mobility Assistance: VC for sequencing. Assist for trunk secondary to extension Transfers Transfers: Sit to Stand;Stand to Sit Sit to Stand: 4: Min assist;With upper extremity assist;With armrests;From chair/3-in-1 Stand to Sit: 4: Min assist;To chair/3-in-1;With armrests;With upper extremity assist Details for Transfer Assistance: VC for hand placement for safety to RW. Min assist for stability secondary to weakness upon standing Ambulation/Gait Ambulation/Gait Assistance: 4: Min assist Ambulation Distance (Feet): 40 Feet Assistive device: Rolling walker Ambulation/Gait Assistance Details: Min assist for stability during ambulation. Cueing throughout for postural cues secondary to pt with kyphotic posture over RW. Gait Pattern: Shuffle;Step-through pattern;Trunk flexed Gait velocity: <1.8 ft/sec which puts her at high risk  for recurrent falls.          PT Goals Acute Rehab PT Goals PT Goal Formulation: With  family PT Goal: Supine/Side to Sit - Progress: Progressing toward goal PT Goal: Sit to Supine/Side - Progress: Progressing toward goal PT Goal: Sit to Stand - Progress: Progressing toward goal PT Goal: Stand to Sit - Progress: Progressing toward goal PT Transfer Goal: Bed to Chair/Chair to Bed - Progress: Progressing toward goal PT Goal: Ambulate - Progress: Progressing toward goal  Visit Information  Last PT Received On: 01/31/12       Cognition  Overall Cognitive Status: History of cognitive impairments - at baseline Arousal/Alertness: Awake/alert Orientation Level: Person Behavior During Session: Restless Cognition - Other Comments: Decreased STM.  Recall is < 3 mins       End of Session PT - End of Session Equipment Utilized During Treatment: Gait belt Activity Tolerance: Patient limited by fatigue Patient left: in chair;with call bell/phone within reach Nurse Communication: Mobility status    Milana Kidney 01/31/2012, 1:43 PM  01/31/2012 Milana Kidney DPT PAGER: [phone] OFFICE: [phone]

## 2012-01-31 NOTE — Progress Notes (Signed)
[facility] PROGRESS NOTE    SUBJECTIVE   Patient has no complaints.  Does not know where she is but son is at bedside. Carotid dopplers showed Bilateral: No evidence of hemodynamically significant internal carotid artery stenosis. Right: Vertebral artery retrograde. Left: Vertebral artery flow is antegrade. Discussed with son. He states this was known for a long time and she has seen both her PCP and specialist (does not know name).      OBJECTIVE   Vital signs in last 24 hours: Temp:  [97.3 F (36.3 C)-98.3 F (36.8 C)] 97.3 F (36.3 C) (04/30 0600) Pulse Rate:  [67-96] 95  (04/30 0600) Resp:  [20] 20  (04/30 0600) BP: (97-131)/(60-75) 108/75 mmHg (04/30 0600) SpO2:  [93 %-95 %] 93 % (04/30 0600) Weight:  [60.2 kg (132 lb 11.5 oz)] 60.2 kg (132 lb 11.5 oz) (04/30 0600)  Intake/Output from previous day: 04/29 0701 - 04/30 0700 In: 354 [P.O.:354] Out: 1200 [Urine:1200] Intake/Output this shift:   Nutritional status: Cardiac  Past Medical History  Diagnosis Date  . ASCVD (arteriosclerotic cardiovascular disease)     acute MI in 1993;40%left main,80%LAD, 100%RCA-PTCA; EF of 40%;stress nuclear in2007; normal EF;  mixed inferolateral ischemia and infarction; CHF with normal EF in 2011  . CHF (congestive heart failure)     normal EF;mixed inferolateral ischemic and infarction;chf with normal EF in 2011  . Hypertension   . Hyperlipidemia   . Cerebrovascular disease     CVA by CT in 2007,but not noted in 2009;duplex in 12/2006 plaque without stenosis; Emergency Room evaluation in 12/2010 for transient aphasia and paresis-clopidogrel initially added to ASA Rx, but subsequently warfarin was substituted for these 2 agents  . Tobacco abuse   . Claudication   . Dementia     mild  . Kyphosis     severe  . Decreased hearing   . Atrial fibrillation   . Chronic anticoagulation   . Chronic kidney disease     Neurologic Exam:    Mental Status:  Alert, not oriented to place, month, year. Speech fluent without evidence of aphasia. Able to follow 3 step commands without difficulty.  Cranial Nerves:  II-Visual fields are decreased on the right.  III/IV/VI-Extraocular movements intact left eye. Right eye shows abducens palsy. Ptosis in right eye. Pupils reactive left eye. Ocular prosthesis right eye  V/VII-Smile asymmetric with left facial droop and decreased NL fold on left  VIII-grossly intact  IX/X-normal gag  XI-bilateral shoulder shrug  XII-midline tongue extension  Motor: 5/5 bilaterally with normal tone and bulk  Sensory: Pinprick and light touch intact throughout, bilaterally  Deep Tendon Reflexes: 1+ and symmetric throughout  Plantars: mute bilaterally  Cerebellar: Normal finger-to-nose, and normal heel-to-shin test.   Lab Results: Results for orders placed during the hospital encounter of 01/29/12 (from the past 24 hour(s))  PROTIME-INR     Status: Abnormal   Collection Time   01/31/12  5:00 AM      Component Value Range   Prothrombin Time 26.2 (*) 11.6 - 15.2 (seconds)   INR 2.36 (*) 0.00 - 1.49    Lipid Panel  Basename 01/30/12 0612  CHOL 140  TRIG 91  HDL 44  CHOLHDL 3.2  VLDL 18  LDLCALC 78    Studies/Results: Ct  Head Wo Contrast  01/29/2012  *RADIOLOGY REPORT*  Clinical Data: Code stroke  CT HEAD WITHOUT CONTRAST  Technique:  Contiguous axial images were obtained from the base of the skull through the vertex without contrast.  Comparison: 11/19/2011  Findings: There is diffuse patchy low density throughout the subcortical and periventricular white matter consistent with chronic small vessel ischemic change.  There is prominence of the sulci and ventricles consistent with brain atrophy.  The tubular vascular calcifications within the right occipital and parietal lobes are again noted and appear unchanged from previous exam.  There is no evidence for acute brain infarct, hemorrhage or mass.   The paranasal sinuses and the mastoid air cells appear clear.  Right globe prosthesis noted.  IMPRESSION:  1.  Small vessel ischemic disease and brain atrophy. 2.  No acute intracranial abnormalities.  Original Report Authenticated By: Rosealee Albee, M.D.   Mr [facility] Wo Contrast  01/29/2012  *RADIOLOGY REPORT*  Clinical Data:  Atrial fibrillation.  Difficulty speaking. Balanced disturbance.  MRI HEAD WITHOUT CONTRAST MRA HEAD WITHOUT CONTRAST  Technique:  Multiplanar, multiecho pulse sequences of the brain and surrounding structures were obtained without intravenous contrast. Angiographic images of the head were obtained using MRA technique without contrast.  Comparison:  Head CT same day.  MRI 11/19/2011.  MRI HEAD  Findings:  There is a 1 cm focus of acute infarction affecting the left side of the pons.  No other acute infarction.  The cerebellum shows atrophy but no focal infarction.  The cerebral hemispheres show atrophy with extensive chronic small vessel disease throughout the deep white matter.  There is subcortical infarction in the right posterior parietal region.  There are abnormal vessels in that region and in the posterior interhemispheric fissure which are seen to be hyperdense at head CT.  This represents a vascular malformation that is either calcified or has been embolized.  No sign of acute hemorrhage.  No mass lesion, obstructive hydrocephalus or extra-axial collection. No pituitary mass.  No inflammatory sinus disease.  IMPRESSION: 1 cm acute infarction of the left pons.  Atrophy and chronic small vessel disease elsewhere throughout the cerebral hemispheres.  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  See above discussion.  MRA HEAD  Findings: Both internal carotid arteries are patent into the brain. There is pronounced atherosclerotic irregularity and ectasia in the siphon regions, left more than right.  Both  anterior and middle cerebral vessels are patent without proximal stenosis, aneurysm more proximal vascular malformation.  I do not see any high flow vessels extending to the region of abnormality in the posterior interhemispheric fissure region and right posterior parietal region.  Both vertebral arteries are patent to the basilar.  No basilar stenosis.  Posterior circulation branch vessels are patent.  IMPRESSION:  No major vessel occlusion or correctable proximal stenosis. Atherosclerotic ectasia and irregularity in the carotid siphon regions, left more than right.  No high flow vessels seen extending to the region of the posterior interhemispheric fissure or right posterior parietal region.  That area was not completely evaluated using the MRI technique, but the findings suggest that any previous vascular malformation in that region is completely thrombosed or embolized.  Original Report Authenticated By: Thomasenia Sales, M.D.   Mr Brain Wo Contrast  01/29/2012  *RADIOLOGY REPORT*  Clinical Data:  Atrial fibrillation.  Difficulty speaking. Balanced disturbance.  MRI HEAD WITHOUT CONTRAST MRA HEAD WITHOUT CONTRAST  Technique:  Multiplanar, multiecho pulse  sequences of the brain and surrounding structures were obtained without intravenous contrast. Angiographic images of the head were obtained using MRA technique without contrast.  Comparison:  Head CT same day.  MRI 11/19/2011.  MRI HEAD  Findings:  There is a 1 cm focus of acute infarction affecting the left side of the pons.  No other acute infarction.  The cerebellum shows atrophy but no focal infarction.  The cerebral hemispheres show atrophy with extensive chronic small vessel disease throughout the deep white matter.  There is subcortical infarction in the right posterior parietal region.  There are abnormal vessels in that region and in the posterior interhemispheric fissure which are seen to be hyperdense at head CT.  This represents a vascular  malformation that is either calcified or has been embolized.  No sign of acute hemorrhage.  No mass lesion, obstructive hydrocephalus or extra-axial collection. No pituitary mass.  No inflammatory sinus disease.  IMPRESSION: 1 cm acute infarction of the left pons.  Atrophy and chronic small vessel disease elsewhere throughout the cerebral hemispheres.  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  See above discussion.  MRA HEAD  Findings: Both internal carotid arteries are patent into the brain. There is pronounced atherosclerotic irregularity and ectasia in the siphon regions, left more than right.  Both anterior and middle cerebral vessels are patent without proximal stenosis, aneurysm more proximal vascular malformation.  I do not see any high flow vessels extending to the region of abnormality in the posterior interhemispheric fissure region and right posterior parietal region.  Both vertebral arteries are patent to the basilar.  No basilar stenosis.  Posterior circulation branch vessels are patent.  IMPRESSION:  No major vessel occlusion or correctable proximal stenosis. Atherosclerotic ectasia and irregularity in the carotid siphon regions, left more than right.  No high flow vessels seen extending to the region of the posterior interhemispheric fissure or right posterior parietal region.  That area was not completely evaluated using the MRI technique, but the findings suggest that any previous vascular malformation in that region is completely thrombosed or embolized.  Original Report Authenticated By: Thomasenia Sales, M.D.   Dg Chest Port 1 View  01/29/2012  *RADIOLOGY REPORT*  Clinical Data: Shortness of breath with generalized weakness.  PORTABLE CHEST - 1 VIEW  Comparison: 12/10/2010 and 08/01/2010 radiographs.  Findings: 1532 hours.  The patient is rotated to the right as before. The heart size and mediastinal contours are stable.   The heart remains mildly enlarged.  Pulmonary aeration has improved. There is no focal airspace disease or edema.  There are probable calcified granulomas on the right.  No acute osseous findings are seen.  IMPRESSION: Improved aeration with stable cardiomegaly.  No acute cardiopulmonary process.  Original Report Authenticated By: Gerrianne Scale, M.D.    Medications:     Scheduled:   . sodium chloride   Intravenous STAT  . donepezil  5 mg Oral QHS  . LORazepam  0.5 mg Oral Q8H  . memantine  10 mg Oral BID  . mulitivitamin with minerals  1 tablet Oral Daily  . omega-3 acid ethyl esters  1 g Oral Daily  . QUEtiapine  25 mg Oral BID  . simvastatin  10 mg Oral q1800  . sodium chloride  3 mL Intravenous Q12H  . vitamin C  500 mg Oral Daily  . warfarin  2.5 mg Oral ONCE-1800  . Warfarin - Pharmacist Dosing Inpatient   Does  not apply q1800    Assessment/Plan:   78 YO female with  new 1 cm acute infarction of the left pons. 2 D echo: 55% to 60%. Wall motion was normal but Carotid dopplers did show retrograde right vertebral artery.  Son at bedside states this is not a new issue and has been know for some time. I cannot find any records in epic to confirm this finding. INR today 2.36.   At this time patient is therapeutic on Coumadin, given her age and baseline dementia would work up retrograde vertebral artery any further.  Recommend: 1) Continue coumadin and statin therapy  No further recommendations.  Neurology S/O     Felicie Morn PA-C [facility] [phone]  01/31/2012, 8:44 AM

## 2012-01-31 NOTE — Progress Notes (Signed)
ANTICOAGULATION CONSULT NOTE - Initial Consult  Pharmacy Consult for warfarin Indication: afib, cva  Allergies  Allergen Reactions  . Haldol (Haloperidol Decanoate)     Over sedation  . Sulfa Antibiotics Other (See Comments)    uknown  . Sulfonamide Derivatives Other (See Comments)    unknown    Patient Measurements: Height: 5\' 5"  (165.1 cm) Weight: 132 lb 11.5 oz (60.2 kg) IBW/kg (Calculated) : 57  Heparin Dosing Weight:   Vital Signs: Temp: 98.1 F (36.7 C) (04/30 0932) Temp src: Oral (04/30 0932) BP: 97/67 mmHg (04/30 0932) Pulse Rate: 84  (04/30 0932)  Labs:  Basename 01/31/12 0500 01/30/12 0612 01/29/12 1445 01/29/12 1438  HGB -- 13.5 15.3* --  HCT -- 41.7 45.0 43.8  PLT -- 213 -- 206  APTT -- -- -- 41*  LABPROT 26.2* 23.0* -- 22.0*  INR 2.36* 2.00* -- 1.89*  HEPARINUNFRC -- -- -- --  CREATININE -- 1.07 1.40* 1.24*  CKTOTAL -- -- -- 47  CKMB -- -- -- 1.6  TROPONINI -- -- -- <0.30   Estimated Creatinine Clearance: 32.7 ml/min (by C-G formula based on Cr of 1.07).  Medical History: Past Medical History  Diagnosis Date  . ASCVD (arteriosclerotic cardiovascular disease)     acute MI in 1993;40%left main,80%LAD, 100%RCA-PTCA; EF of 40%;stress nuclear in2007; normal EF;  mixed inferolateral ischemia and infarction; CHF with normal EF in 2011  . CHF (congestive heart failure)     normal EF;mixed inferolateral ischemic and infarction;chf with normal EF in 2011  . Hypertension   . Hyperlipidemia   . Cerebrovascular disease     CVA by CT in 2007,but not noted in 2009;duplex in 12/2006 plaque without stenosis; Emergency Room evaluation in 12/2010 for transient aphasia and paresis-clopidogrel initially added to ASA Rx, but subsequently warfarin was substituted for these 2 agents  . Tobacco abuse   . Claudication   . Dementia     mild  . Kyphosis     severe  . Decreased hearing   . Atrial fibrillation   . Chronic anticoagulation   . Chronic kidney disease      Medications:  Prescriptions prior to admission  Medication Sig Dispense Refill  . alendronate (FOSAMAX) 70 MG tablet Take 70 mg by mouth every 7 (seven) days. Take with a full glass of water on an empty stomach.       . cetirizine (ZYRTEC) 10 MG tablet Take 10 mg by mouth daily.        . Cholecalciferol (VITAMIN D) 1000 UNITS capsule Take 1,000 Units by mouth daily.        . Dietary Management Product (AXONA) packet Take 40 g by mouth daily.        Marland Kitchen donepezil (ARICEPT) 5 MG tablet Take 5 mg by mouth at bedtime.       . furosemide (LASIX) 40 MG tablet Take 20 mg by mouth daily.      . haloperidol (HALDOL) 0.5 MG tablet Take 0.5 mg by mouth at bedtime.      Marland Kitchen LORazepam (ATIVAN) 0.5 MG tablet Take 0.5 mg by mouth every 8 (eight) hours.      . memantine (NAMENDA) 10 MG tablet Take 10 mg by mouth 2 (two) times daily.       . Multiple Vitamins-Minerals (MULTIVITAMIN WITH MINERALS) tablet Take 1 tablet by mouth daily.        . nitroGLYCERIN (NITROSTAT) 0.4 MG SL tablet Place 0.4 mg under the tongue every 5 (five) minutes  as needed. For chest pain      . Omega-3 Fatty Acids (FISH OIL) 1000 MG CAPS Take 1,200 mg by mouth 2 (two) times daily.       . pravastatin (PRAVACHOL) 40 MG tablet Take 40 mg by mouth at bedtime.       . vitamin C (ASCORBIC ACID) 500 MG tablet Take 500 mg by mouth daily.      Marland Kitchen warfarin (COUMADIN) 2.5 MG tablet Take 2.5 mg by mouth daily. TAKE 1 TABLET (2.5 MG TOTAL) BY MOUTH DAILY. TAKE 1 TABLET DAILY EXCEPT 1/2 TABLET ON T,TH,SAT      . DISCONTD: levofloxacin (LEVAQUIN) 500 MG tablet Take 500 mg by mouth daily.      Marland Kitchen albuterol-ipratropium (COMBIVENT) 18-103 MCG/ACT inhaler Inhale 2 puffs into the lungs every 6 (six) hours as needed. For shortness of breath        Assessment: INR 2.36 today, therapeutic in this 78 yo F with h/o afib and cva . Home dose was 2.5mg  qMWFSun, 1.25mg  qTTSat.  No bleeding reported.  INR remains therapeutic on her home coumadin dosage.   Goal of  Therapy:  INR 2-3   Plan:  Warfarin 1.25 mg po x1 tonight (usual home dosage). Daily INR  Noah Delaine, RPh 01/31/2012 12:35 PM

## 2012-01-31 NOTE — Discharge Instructions (Signed)
Follow with Primary MD Cassell Smiles., MD, MD in 3 days..  Get CBC, CMP, INR checked 3 days by Primary MD and again as instructed by your Primary MD.   Get Medicines reviewed and adjusted.  Please request your Prim.MD to go over all Hospital Tests and Procedure/Radiological results at the follow up, please get all Hospital records sent to your Prim MD by signing hospital release before you go home.  Activity: Fall precautions use walker/cane & assistance as needed  Diet: Heart Healthy, Aspiration precautions.  For Heart failure patients - Check your Weight same time everyday, if you gain over 2 pounds, or you develop in leg swelling, experience more shortness of breath or chest pain, call your Primary MD immediately. Follow Cardiac Low Salt Diet and 1.8 lit/day fluid restriction.  Disposition Home  If you experience worsening of your admission symptoms, develop shortness of breath, life threatening emergency, suicidal or homicidal thoughts you must seek medical attention immediately by calling 911 or calling your MD immediately  if symptoms less severe.  You Must read complete instructions/literature along with all the possible adverse reactions/side effects for all the Medicines you take and that have been prescribed to you. Take any new Medicines after you have completely understood and accpet all the possible adverse reactions/side effects.   Do not drive if your were admitted for syncope or siezures until you have seen by Primary MD or a Neurologist and advised to drive.  Do not drive when taking Pain medications.    Do not take more than prescribed Pain, Sleep and Anxiety Medications  Special Instructions: If you have smoked or chewed Tobacco  in the last 2 yrs please stop smoking, stop any regular Alcohol  and or any Recreational drug use.  Wear Seat belts while driving.

## 2012-01-31 NOTE — Progress Notes (Signed)
Discontinued foley catheter at 0720. Pt tolerated well.  Salvadore Oxford, RN

## 2012-01-31 NOTE — Progress Notes (Signed)
   CARE MANAGEMENT NOTE 01/31/2012  Patient:  Angela [name], Angela [name]   Account Number:  [id number]  Date Initiated:  01/30/2012  Documentation initiated by:  Letha Cape  Subjective/Objective Assessment:   dx tia/cva  admit- lives with son, Angela [name]  [phone]  [phone].  patient has 24 hr care at home.     Action/Plan:   pt/ot /st eval   Anticipated DC Date:  01/31/2012   Anticipated DC Plan:  HOME W HOME HEALTH SERVICES      DC Planning Services  CM consult      [facility] Choice  HOME HEALTH   Choice offered to / List presented to:  C-4 Adult Children        HH arranged  HH-2 PT  HH-3 OT      [facility] agency  [facility]   Status of service:  Completed, signed off Medicare Important Message given?   (If response is "NO", the following Medicare IM given date fields will be blank) Date Medicare IM given:   Date Additional Medicare IM given:    Discharge Disposition:  HOME W HOME HEALTH SERVICES  Per UR Regulation:    If discussed at Long Length of Stay Meetings, dates discussed:    Comments:  01/31/12 11:37 Letha Cape RN, BSN  [phone] patient for discharge today, spoke with patient son, he chose [facility] for hhpt/ot.  Referral made with [facility], [facility] notified.  Soc will begin 24-48 hrs post discharge.  Foley was d/c and patient has not voided yet but as soon as she voids she can dc to home.   01/30/12 10:25 Letha Cape RN, BSN [phone] patient lives with children, patient has medication coverage and transportation.  Await pt/ot/st evals.   Has rolling walker, has home oxygen with [employer]. Patient also has a nebulizer machine NCM will continue to follow for dc needs.  Patient lives in [place], she had an appt today with [place] for bld draw for coumadin at 4 pm, I canceled appt and rescheduled for Wed 5-1 at 4 pm for patient, she will be seeing Dr. Dietrich Pates.

## 2012-02-01 ENCOUNTER — Ambulatory Visit (INDEPENDENT_AMBULATORY_CARE_PROVIDER_SITE_OTHER): Payer: PRIVATE HEALTH INSURANCE | Admitting: *Deleted

## 2012-02-01 DIAGNOSIS — Z7901 Long term (current) use of anticoagulants: Secondary | ICD-10-CM

## 2012-02-01 DIAGNOSIS — I4891 Unspecified atrial fibrillation: Secondary | ICD-10-CM

## 2012-02-01 LAB — POCT INR: INR: 2.7

## 2012-02-15 ENCOUNTER — Ambulatory Visit (INDEPENDENT_AMBULATORY_CARE_PROVIDER_SITE_OTHER): Payer: PRIVATE HEALTH INSURANCE | Admitting: *Deleted

## 2012-02-15 DIAGNOSIS — Z7901 Long term (current) use of anticoagulants: Secondary | ICD-10-CM

## 2012-02-15 DIAGNOSIS — I4891 Unspecified atrial fibrillation: Secondary | ICD-10-CM

## 2012-02-15 LAB — POCT INR: INR: 2.6

## 2012-02-19 ENCOUNTER — Other Ambulatory Visit: Payer: Self-pay | Admitting: Cardiology

## 2012-02-24 ENCOUNTER — Ambulatory Visit (INDEPENDENT_AMBULATORY_CARE_PROVIDER_SITE_OTHER): Payer: 59 | Admitting: Adult Health

## 2012-02-24 ENCOUNTER — Encounter: Payer: Self-pay | Admitting: Adult Health

## 2012-02-24 VITALS — BP 117/63 | HR 77 | Resp 18 | Ht 60.0 in | Wt 127.0 lb

## 2012-02-24 DIAGNOSIS — I10 Essential (primary) hypertension: Secondary | ICD-10-CM

## 2012-02-24 DIAGNOSIS — I4891 Unspecified atrial fibrillation: Secondary | ICD-10-CM

## 2012-02-24 NOTE — Assessment & Plan Note (Signed)
Heart rate is well controlled. She is not on rate reducing medication. Continued watchful waiting on coumadin.

## 2012-02-24 NOTE — Assessment & Plan Note (Signed)
Much better controlled with use of lisinopril and continued lasix, . She has no dizziness or falls, but is cautioned about this with use of couamdin. Son has caretaker there with her during the day when he is at work. Will see her in 6 months.

## 2012-02-24 NOTE — Progress Notes (Signed)
HPI: Angela [name] is a 78 y/o patient of Dr. Dietrich Pates with multiple medical problems that we are seeing for ongoing assessment and treatment of atrial fibrillation on coumadin, hypertension, CVA, TIA, and hypercholesterolemia. She was recently hospitalized for TIA and UTI 2 weeks ago. She was found to have slurred speech and a right sided facial droop. Carotid studies showed mild stenosis.Most recent echo demonstrated normal EF. She has dementia and has no recollection of her recent admission. Her son , who is with her, is her main caretaker. On last visit she was started on lisinopril for better BP control.  She has dementia and is a poor historian. Her son states that she is in her usual state with the exception of evening and nightime agitation and increased confusion. She has been given Rx for haldol on recent discharge. He states that it is helpful.  Allergies  Allergen Reactions  . Haldol (Haloperidol Decanoate)     Over sedation  . Sulfa Antibiotics Other (See Comments)    uknown  . Sulfonamide Derivatives Other (See Comments)    unknown    Current Outpatient Prescriptions  Medication Sig Dispense Refill  . albuterol-ipratropium (COMBIVENT) 18-103 MCG/ACT inhaler Inhale 2 puffs into the lungs every 6 (six) hours as needed. For shortness of breath      . alendronate (FOSAMAX) 70 MG tablet Take 70 mg by mouth every 7 (seven) days. Take with a full glass of water on an empty stomach.       . cetirizine (ZYRTEC) 10 MG tablet Take 10 mg by mouth daily.        . Cholecalciferol (VITAMIN D) 1000 UNITS capsule Take 1,000 Units by mouth daily.        . Dietary Management Product (AXONA) packet Take 40 g by mouth daily.        Marland Kitchen donepezil (ARICEPT) 5 MG tablet Take 10 mg by mouth at bedtime.       . furosemide (LASIX) 40 MG tablet Take 40 mg by mouth daily.       . haloperidol (HALDOL) 0.5 MG tablet Take 0.5 mg by mouth at bedtime.      Marland Kitchen lisinopril (PRINIVIL,ZESTRIL) 2.5 MG tablet Take 2.5 mg by  mouth daily.      Marland Kitchen LORazepam (ATIVAN) 0.5 MG tablet Take 0.5 mg by mouth every 8 (eight) hours.      . memantine (NAMENDA) 10 MG tablet Take 10 mg by mouth 2 (two) times daily.       . Multiple Vitamins-Minerals (MULTIVITAMIN WITH MINERALS) tablet Take 1 tablet by mouth daily.        . nitroGLYCERIN (NITROSTAT) 0.4 MG SL tablet Place 0.4 mg under the tongue every 5 (five) minutes as needed. For chest pain       . Omega-3 Fatty Acids (FISH OIL) 1000 MG CAPS Take 1,200 mg by mouth 2 (two) times daily.       . potassium chloride SA (K-DUR,KLOR-CON) 20 MEQ tablet Take 20 mEq by mouth daily.      . pravastatin (PRAVACHOL) 40 MG tablet Take 40 mg by mouth at bedtime.       . triazolam (HALCION) 0.125 MG tablet Take 0.125 mg by mouth at bedtime as needed.      . vitamin C (ASCORBIC ACID) 500 MG tablet Take 500 mg by mouth daily.      Marland Kitchen warfarin (COUMADIN) 2.5 MG tablet Take 2.5 mg by mouth daily. TAKE 1 TABLET (2.5 MG TOTAL) BY MOUTH DAILY.  TAKE 1 TABLET DAILY EXCEPT 1/2 TABLET ON T,TH,SAT      . warfarin (COUMADIN) 2.5 MG tablet TAKE 1 TABLET (2.5 MG TOTAL) BY MOUTH DAILY. TAKE 1 TABLET DAILY EXCEPT 1/2 TABLET ON T,TH,SAT  30 tablet  3    Past Medical History  Diagnosis Date  . ASCVD (arteriosclerotic cardiovascular disease)     acute MI in 1993;40%left main,80%LAD, 100%RCA-PTCA; EF of 40%;stress nuclear in2007; normal EF;  mixed inferolateral ischemia and infarction; CHF with normal EF in 2011  . CHF (congestive heart failure)     normal EF;mixed inferolateral ischemic and infarction;chf with normal EF in 2011  . Hypertension   . Hyperlipidemia   . Cerebrovascular disease     CVA by CT in 2007,but not noted in 2009;duplex in 12/2006 plaque without stenosis; Emergency Room evaluation in 12/2010 for transient aphasia and paresis-clopidogrel initially added to ASA Rx, but subsequently warfarin was substituted for these 2 agents  . Tobacco abuse   . Claudication   . Dementia     mild  . Kyphosis       severe  . Decreased hearing   . Atrial fibrillation   . Chronic anticoagulation   . Chronic kidney disease     Past Surgical History  Procedure Date  . Abdominal hysterectomy   . Appendectomy   . Tonsillectomy   . Enucleation     Right-resulted from infection    ZOX:WRUEAV of systems complete and found to be negative unless listed above PHYSICAL EXAM BP 117/63  Pulse 77  Resp 18  Ht 5' (1.524 m)  Wt 127 lb (57.607 kg)  BMI 24.80 kg/m2  General: Well developed, well nourished, in no acute distress Head: Eyes PERRLA,right eyelid droop. No xanthomas.   Normal cephalic and atramatic  Lungs: Clear bilaterally to auscultation and percussion. Heart: HRIR S1 S2, 2/6 systolic murmur,without MRG.  Pulses are 2+ & equal.            No carotid bruit. No JVD.  No abdominal bruits. No femoral bruits. Abdomen: Bowel sounds are positive, abdomen soft and non-tender without masses or                  Hernia's noted. Msk:  Back significant kyphosis, slow unsteady gait. Diminished strength and tone for age. Extremities: No clubbing, cyanosis or edema.  DP +1 Neuro: Alert with mild confusion.  Psych:  Good affect,    ASSESSMENT AND PLAN

## 2012-02-24 NOTE — Patient Instructions (Signed)
Your physician recommends that you schedule a follow-up appointment in: 6 months  

## 2012-03-14 ENCOUNTER — Ambulatory Visit (INDEPENDENT_AMBULATORY_CARE_PROVIDER_SITE_OTHER): Payer: PRIVATE HEALTH INSURANCE | Admitting: *Deleted

## 2012-03-14 DIAGNOSIS — Z7901 Long term (current) use of anticoagulants: Secondary | ICD-10-CM

## 2012-03-14 DIAGNOSIS — I4891 Unspecified atrial fibrillation: Secondary | ICD-10-CM

## 2012-03-14 LAB — POCT INR: INR: 2.3

## 2012-03-21 ENCOUNTER — Other Ambulatory Visit: Payer: Self-pay | Admitting: *Deleted

## 2012-03-21 MED ORDER — PRAVASTATIN SODIUM 40 MG PO TABS: 40.0000 mg | ORAL_TABLET | Freq: Every day | ORAL | Status: DC

## 2012-04-25 ENCOUNTER — Ambulatory Visit (INDEPENDENT_AMBULATORY_CARE_PROVIDER_SITE_OTHER): Payer: PRIVATE HEALTH INSURANCE | Admitting: *Deleted

## 2012-04-25 DIAGNOSIS — I4891 Unspecified atrial fibrillation: Secondary | ICD-10-CM

## 2012-04-25 DIAGNOSIS — Z7901 Long term (current) use of anticoagulants: Secondary | ICD-10-CM

## 2012-04-25 LAB — POCT INR: INR: 1.8

## 2012-05-14 ENCOUNTER — Ambulatory Visit (INDEPENDENT_AMBULATORY_CARE_PROVIDER_SITE_OTHER): Payer: PRIVATE HEALTH INSURANCE | Admitting: *Deleted

## 2012-05-14 DIAGNOSIS — Z7901 Long term (current) use of anticoagulants: Secondary | ICD-10-CM

## 2012-05-14 DIAGNOSIS — I4891 Unspecified atrial fibrillation: Secondary | ICD-10-CM

## 2012-06-06 ENCOUNTER — Ambulatory Visit (INDEPENDENT_AMBULATORY_CARE_PROVIDER_SITE_OTHER): Payer: PRIVATE HEALTH INSURANCE | Admitting: *Deleted

## 2012-06-06 DIAGNOSIS — I4891 Unspecified atrial fibrillation: Secondary | ICD-10-CM

## 2012-06-06 DIAGNOSIS — Z7901 Long term (current) use of anticoagulants: Secondary | ICD-10-CM

## 2012-06-15 ENCOUNTER — Other Ambulatory Visit: Payer: Self-pay | Admitting: Cardiology

## 2012-06-27 ENCOUNTER — Ambulatory Visit (INDEPENDENT_AMBULATORY_CARE_PROVIDER_SITE_OTHER): Payer: 59 | Admitting: *Deleted

## 2012-06-27 DIAGNOSIS — I4891 Unspecified atrial fibrillation: Secondary | ICD-10-CM

## 2012-06-27 DIAGNOSIS — Z7901 Long term (current) use of anticoagulants: Secondary | ICD-10-CM

## 2012-07-25 ENCOUNTER — Ambulatory Visit (INDEPENDENT_AMBULATORY_CARE_PROVIDER_SITE_OTHER): Payer: 59 | Admitting: *Deleted

## 2012-07-25 DIAGNOSIS — I4891 Unspecified atrial fibrillation: Secondary | ICD-10-CM

## 2012-07-25 DIAGNOSIS — Z7901 Long term (current) use of anticoagulants: Secondary | ICD-10-CM

## 2012-07-25 LAB — POCT INR: INR: 3

## 2012-08-23 ENCOUNTER — Ambulatory Visit (INDEPENDENT_AMBULATORY_CARE_PROVIDER_SITE_OTHER): Payer: 59 | Admitting: Cardiology

## 2012-08-23 ENCOUNTER — Encounter: Payer: Self-pay | Admitting: Cardiology

## 2012-08-23 ENCOUNTER — Ambulatory Visit (INDEPENDENT_AMBULATORY_CARE_PROVIDER_SITE_OTHER): Payer: 59 | Admitting: *Deleted

## 2012-08-23 VITALS — BP 110/58 | HR 78 | Ht 60.0 in | Wt 123.0 lb

## 2012-08-23 DIAGNOSIS — Z7901 Long term (current) use of anticoagulants: Secondary | ICD-10-CM

## 2012-08-23 DIAGNOSIS — I10 Essential (primary) hypertension: Secondary | ICD-10-CM

## 2012-08-23 DIAGNOSIS — I4891 Unspecified atrial fibrillation: Secondary | ICD-10-CM

## 2012-08-23 DIAGNOSIS — I480 Paroxysmal atrial fibrillation: Secondary | ICD-10-CM

## 2012-08-23 DIAGNOSIS — I509 Heart failure, unspecified: Secondary | ICD-10-CM

## 2012-08-23 DIAGNOSIS — M4000 Postural kyphosis, site unspecified: Secondary | ICD-10-CM

## 2012-08-23 DIAGNOSIS — F17201 Nicotine dependence, unspecified, in remission: Secondary | ICD-10-CM | POA: Insufficient documentation

## 2012-08-23 DIAGNOSIS — I679 Cerebrovascular disease, unspecified: Secondary | ICD-10-CM

## 2012-08-23 DIAGNOSIS — I7090 Unspecified atherosclerosis: Secondary | ICD-10-CM

## 2012-08-23 DIAGNOSIS — I2510 Atherosclerotic heart disease of native coronary artery without angina pectoris: Secondary | ICD-10-CM

## 2012-08-23 DIAGNOSIS — E785 Hyperlipidemia, unspecified: Secondary | ICD-10-CM

## 2012-08-23 NOTE — Progress Notes (Deleted)
Name: Angela [name]    DOB: [birth year]  Age: 78 y.o.  MR#: [id number]       PCP:  Cassell Smiles., MD      Insurance: @PAYORNAME @   CC:   No chief complaint on file.  MEDICATION LIST NO RECENT LABS HEME CARDS NOT RETURNED BLOOD PRESSURE LIST PROVIDED  VS BP 110/58  Pulse 78  Ht 5' (1.524 m)  Wt 123 lb (55.792 kg)  BMI 24.02 kg/m2  Weights Current Weight  08/23/12 123 lb (55.792 kg)  02/24/12 127 lb (57.607 kg)  01/31/12 132 lb 11.5 oz (60.2 kg)    Blood Pressure  BP Readings from Last 3 Encounters:  08/23/12 110/58  02/24/12 117/63  01/31/12 97/67     Admit date:  (Not on file) Last encounter with RMR:  06/15/2012   Allergy Allergies  Allergen Reactions  . Haldol (Haloperidol Decanoate)     Over sedation  . Sulfa Antibiotics Other (See Comments)    uknown  . Sulfonamide Derivatives Other (See Comments)    unknown    Current Outpatient Prescriptions  Medication Sig Dispense Refill  . albuterol (PROVENTIL) (2.5 MG/3ML) 0.083% nebulizer solution       . albuterol-ipratropium (COMBIVENT) 18-103 MCG/ACT inhaler Inhale 2 puffs into the lungs every 6 (six) hours as needed. For shortness of breath      . alendronate (FOSAMAX) 70 MG tablet Take 70 mg by mouth every 7 (seven) days. Take with a full glass of water on an empty stomach.       . cetirizine (ZYRTEC) 10 MG tablet Take 10 mg by mouth daily.        . Cholecalciferol (VITAMIN D) 1000 UNITS capsule Take 1,000 Units by mouth daily.        . Dietary Management Product (AXONA) packet Take 40 g by mouth daily.        Marland Kitchen donepezil (ARICEPT) 5 MG tablet Take 10 mg by mouth at bedtime.       . furosemide (LASIX) 40 MG tablet Take 40 mg by mouth daily.       . haloperidol (HALDOL) 0.5 MG tablet Take 0.5 mg by mouth at bedtime.      Marland Kitchen lisinopril (PRINIVIL,ZESTRIL) 2.5 MG tablet Take 2.5 mg by mouth daily.      Marland Kitchen LORazepam (ATIVAN) 0.5 MG tablet Take 0.5 mg by mouth every 8 (eight) hours.      . memantine (NAMENDA) 10 MG  tablet Take 10 mg by mouth 2 (two) times daily.       . Multiple Vitamins-Minerals (MULTIVITAMIN WITH MINERALS) tablet Take 1 tablet by mouth daily.        . nitroGLYCERIN (NITROSTAT) 0.4 MG SL tablet Place 0.4 mg under the tongue every 5 (five) minutes as needed. For chest pain       . Omega-3 Fatty Acids (FISH OIL) 1000 MG CAPS Take 1,200 mg by mouth 2 (two) times daily.       . potassium chloride SA (K-DUR,KLOR-CON) 20 MEQ tablet Take 20 mEq by mouth daily.      . pravastatin (PRAVACHOL) 40 MG tablet Take 1 tablet (40 mg total) by mouth at bedtime.  30 tablet  10  . triazolam (HALCION) 0.125 MG tablet Take 0.125 mg by mouth at bedtime as needed.      . vitamin C (ASCORBIC ACID) 500 MG tablet Take 500 mg by mouth daily.      Marland Kitchen warfarin (COUMADIN) 2.5 MG tablet Take 2.5 mg by  mouth daily. TAKE 1 TABLET (2.5 MG TOTAL) BY MOUTH DAILY. TAKE 1 TABLET DAILY EXCEPT 1/2 TABLET ON T,TH,SAT      . warfarin (COUMADIN) 2.5 MG tablet Take 1 tablet (2.5 mg total) by mouth daily.  30 tablet  3    Discontinued Meds:   There are no discontinued medications.  Patient Active Problem List  Diagnosis  . HYPERLIPIDEMIA  . DEMENTIA  . HYPERTENSION  . KYPHOSIS  . Chronic anticoagulation  . Cerebrovascular disease  . ASCVD (arteriosclerotic cardiovascular disease)  . CHF (congestive heart failure)  . Claudication  . Prosthetic eye globe  . Paroxysmal atrial fibrillation  . Tobacco abuse, in remission    LABS Anti-coag visit on 07/25/2012  Component Date Value  . INR 07/25/2012 3.0   Anti-coag visit on 06/27/2012  Component Date Value  . INR 06/27/2012 2.7   Anti-coag visit on 06/06/2012  Component Date Value  . INR 06/06/2012 2.0      Results for this Opt Visit:     Results for orders placed in visit on 07/25/12  POCT INR      Component Value Range   INR 3.0      EKG Orders placed during the hospital encounter of 01/29/12  . EKG 12-LEAD  . EKG 12-LEAD  . EKG     Prior Assessment  and Plan Problem List as of 08/23/2012            Cardiology Problems   HYPERLIPIDEMIA   Last Assessment & Plan Note   03/11/2011 Office Visit Signed 03/11/2011  1:17 PM by Kathlen Brunswick, MD    No recent lipid profile available; one will be obtained along with other needed laboratory included a CBC, chemistry profile and TSH level.    HYPERTENSION   Last Assessment & Plan Note   02/24/2012 Office Visit Signed 02/24/2012  5:10 PM by Jodelle Gross, NP    Much better controlled with use of lisinopril and continued lasix, . She has no dizziness or falls, but is cautioned about this with use of couamdin. Son has caretaker there with her during the day when he is at work. Will see her in 6 months.    Cerebrovascular disease   Last Assessment & Plan Note   03/11/2011 Office Visit Signed 03/11/2011  1:15 PM by Kathlen Brunswick, MD    Patient had no definite obstructive disease on a recent carotid ultrasound study.  Prior neurologic symptoms apparently reflected cardioembolism.  No further treatment or testing is required at present.    ASCVD (arteriosclerotic cardiovascular disease)   Last Assessment & Plan Note   05/25/2011 Office Visit Signed 05/25/2011  3:43 PM by Jodelle Gross, NP    She has been having an occasional incidence of chest discomfort which is relieved with NTG X 1.  EKG reviewed is unchanged from prior EKG. I will continue to treat her medically for now with Rx for NTG as her son used a family member's doses.  IF chest pain is persistent, my consider adding long acting nitrate to medication regimen.  She will follow with Dr. Dietrich Pates in 9 months.    CHF (congestive heart failure)   Last Assessment & Plan Note   03/11/2011 Office Visit Signed 03/11/2011  1:16 PM by Kathlen Brunswick, MD    CHF appears compensated on examination today.  BNP was only modestly elevated when checked at her last visit.  Current therapy appears appropriate and effective.    Paroxysmal atrial  fibrillation     Other   Prosthetic eye globe   DEMENTIA   KYPHOSIS   Chronic anticoagulation   Last Assessment & Plan Note   03/11/2011 Office Visit Signed 03/11/2011  1:17 PM by Kathlen Brunswick, MD    Patient has done well since anticoagulation was started.  We will monitor for occult GI blood loss and adjust warfarin dosing as necessary.    Claudication   Tobacco abuse, in remission       Imaging: No results found.   FRS Calculation: Score not calculated

## 2012-08-23 NOTE — Assessment & Plan Note (Signed)
No neurologic symptoms since hospital admission in 01/2012.

## 2012-08-23 NOTE — Assessment & Plan Note (Signed)
Stable and therapeutic INRs; no recent falls; monitoring for occult GI blood loss is limited since son has difficulty collecting stool specimens, and patient is not an appropriate candidate for screening colonoscopy.  She has not had anemia with CBC last assessed 6 months ago.

## 2012-08-23 NOTE — Patient Instructions (Addendum)
Your physician recommends that you schedule a follow-up appointment in: 10 months  Stools and return to office

## 2012-08-23 NOTE — Progress Notes (Signed)
Patient ID: Angela [name], female   DOB: 1921/09/19, 78 y.o.   MRN: [id number]  HPI: Scheduled return visit for this very nice older woman with atrial fibrillation.  She suffered a small left pontine CVA in 01/2012 with no apparent long-term neurologic deficit.  Memory is impaired, but she denies any dyspnea or chest discomfort.  There's been no edema.  She has been quite stable since her brief hospitalization 7 months ago.  She is able to get around at home without significant difficulty.  Prior to Admission medications   Medication Sig Start Date End Date Taking? Authorizing Provider  albuterol (PROVENTIL) (2.5 MG/3ML) 0.083% nebulizer solution  08/09/12  Yes Historical Provider, MD  albuterol-ipratropium (COMBIVENT) 18-103 MCG/ACT inhaler Inhale 2 puffs into the lungs every 6 (six) hours as needed. For shortness of breath   Yes Historical Provider, MD  alendronate (FOSAMAX) 70 MG tablet Take 70 mg by mouth every 7 (seven) days. Take with a full glass of water on an empty stomach.    Yes Historical Provider, MD  cetirizine (ZYRTEC) 10 MG tablet Take 10 mg by mouth daily.     Yes Historical Provider, MD  Cholecalciferol (VITAMIN D) 1000 UNITS capsule Take 1,000 Units by mouth daily.     Yes Historical Provider, MD  Dietary Management Product (AXONA) packet Take 40 g by mouth daily.     Yes Historical Provider, MD  donepezil (ARICEPT) 5 MG tablet Take 10 mg by mouth at bedtime.    Yes Historical Provider, MD  furosemide (LASIX) 40 MG tablet Take 40 mg by mouth daily.    Yes Historical Provider, MD  haloperidol (HALDOL) 0.5 MG tablet Take 0.5 mg by mouth at bedtime.   Yes Historical Provider, MD  lisinopril (PRINIVIL,ZESTRIL) 2.5 MG tablet Take 2.5 mg by mouth daily.   Yes Historical Provider, MD  LORazepam (ATIVAN) 0.5 MG tablet Take 0.5 mg by mouth every 8 (eight) hours.   Yes Historical Provider, MD  memantine (NAMENDA) 10 MG tablet Take 10 mg by mouth 2 (two) times daily.    Yes Historical Provider,  MD  Multiple Vitamins-Minerals (MULTIVITAMIN WITH MINERALS) tablet Take 1 tablet by mouth daily.     Yes Historical Provider, MD  nitroGLYCERIN (NITROSTAT) 0.4 MG SL tablet Place 0.4 mg under the tongue every 5 (five) minutes as needed. For chest pain  05/25/11 09/22/12 Yes Jodelle Gross, NP  Omega-3 Fatty Acids (FISH OIL) 1000 MG CAPS Take 1,200 mg by mouth 2 (two) times daily.    Yes Historical Provider, MD  potassium chloride SA (K-DUR,KLOR-CON) 20 MEQ tablet Take 20 mEq by mouth daily.   Yes Historical Provider, MD  pravastatin (PRAVACHOL) 40 MG tablet Take 1 tablet (40 mg total) by mouth at bedtime. 03/21/12  Yes Jodelle Gross, NP  triazolam (HALCION) 0.125 MG tablet Take 0.125 mg by mouth at bedtime as needed.   Yes Historical Provider, MD  vitamin C (ASCORBIC ACID) 500 MG tablet Take 500 mg by mouth daily.   Yes Historical Provider, MD  warfarin (COUMADIN) 2.5 MG tablet Take 2.5 mg by mouth daily. TAKE 1 TABLET (2.5 MG TOTAL) BY MOUTH DAILY. TAKE 1 TABLET DAILY EXCEPT 1/2 TABLET ON T,TH,SAT   Yes Historical Provider, MD  warfarin (COUMADIN) 2.5 MG tablet Take 1 tablet (2.5 mg total) by mouth daily. 06/15/12  Yes Kathlen Brunswick, MD    Allergies  Allergen Reactions  . Haldol (Haloperidol Decanoate)     Over sedation  . Sulfa  Antibiotics Other (See Comments)    uknown  . Sulfonamide Derivatives Other (See Comments)    unknown      Past medical history, social history, and family history reviewed and updated.  ROS: Denies dyspnea, orthopnea or PND.  She has been losing weight and eating inconsistently, but denies abdominal discomfort, nausea, emesis, constipation or diarrhea.  All other systems reviewed and are negative.  PHYSICAL EXAM: BP 110/58  Pulse 78  Ht 5' (1.524 m)  Wt 55.792 kg (123 lb)  BMI 24.02 kg/m2  General-Well developed; no acute distress Body habitus-proportionate weight and height Neck-No JVD; no carotid bruits Lungs-clear lung fields; resonant to  percussion; moderate to marked kyphosis Cardiovascular-normal PMI; Distant S1 and S2; irregular rhythm; apical heart rate of 100 bpm; grade 1-2/6 systolic ejection murmur at the base. Abdomen-normal bowel sounds; soft and non-tender without masses or organomegaly Musculoskeletal-No deformities, no cyanosis or clubbing Neurologic-Normal cranial nerves; symmetric strength and tone Skin-Warm, no significant lesions Extremities-1+ distal pulses; Trace edema  ASSESSMENT AND PLAN:  [place] Bing, MD 08/23/2012 3:41 PM

## 2012-08-23 NOTE — Assessment & Plan Note (Addendum)
Lipid profile performed 6 months ago reveals excellent control of hyperlipidemia with current therapy, which will be continued.

## 2012-08-23 NOTE — Assessment & Plan Note (Signed)
Atrial fibrillation is likely permanent at this point.  Heart rate is slightly suboptimal, but I am not inclined to add additional medication to slow ventricular response, when this may not provide the patient any significant benefit.  Risk of thromboembolism is substantial, and full anticoagulation will be continued unless and until a contraindication arises.

## 2012-08-23 NOTE — Assessment & Plan Note (Signed)
No symptoms to indicate progression of coronary disease.  We will continue to manage CV risk factors.  History of CHF in 2011 with preserved LV systolic function-has been well compensated since.

## 2012-08-23 NOTE — Assessment & Plan Note (Signed)
Blood pressure is well controlled with current medications, which will be continued.

## 2012-08-26 ENCOUNTER — Other Ambulatory Visit: Payer: Self-pay | Admitting: Cardiology

## 2012-09-05 ENCOUNTER — Emergency Department (HOSPITAL_COMMUNITY)
Admission: EM | Admit: 2012-09-05 | Discharge: 2012-09-05 | Disposition: A | Discharge status: Home / Self Care | Payer: PRIVATE HEALTH INSURANCE | Attending: Emergency Medicine | Admitting: Emergency Medicine

## 2012-09-05 ENCOUNTER — Encounter (HOSPITAL_COMMUNITY): Payer: Self-pay | Admitting: *Deleted

## 2012-09-05 ENCOUNTER — Emergency Department (HOSPITAL_COMMUNITY): Payer: PRIVATE HEALTH INSURANCE

## 2012-09-05 DIAGNOSIS — I2510 Atherosclerotic heart disease of native coronary artery without angina pectoris: Secondary | ICD-10-CM | POA: Insufficient documentation

## 2012-09-05 DIAGNOSIS — E785 Hyperlipidemia, unspecified: Secondary | ICD-10-CM | POA: Insufficient documentation

## 2012-09-05 DIAGNOSIS — F0390 Unspecified dementia without behavioral disturbance: Secondary | ICD-10-CM | POA: Insufficient documentation

## 2012-09-05 DIAGNOSIS — N189 Chronic kidney disease, unspecified: Secondary | ICD-10-CM | POA: Insufficient documentation

## 2012-09-05 DIAGNOSIS — K529 Noninfective gastroenteritis and colitis, unspecified: Secondary | ICD-10-CM

## 2012-09-05 DIAGNOSIS — I4891 Unspecified atrial fibrillation: Secondary | ICD-10-CM | POA: Insufficient documentation

## 2012-09-05 DIAGNOSIS — I509 Heart failure, unspecified: Secondary | ICD-10-CM | POA: Insufficient documentation

## 2012-09-05 DIAGNOSIS — Z79899 Other long term (current) drug therapy: Secondary | ICD-10-CM | POA: Insufficient documentation

## 2012-09-05 DIAGNOSIS — Z87891 Personal history of nicotine dependence: Secondary | ICD-10-CM | POA: Insufficient documentation

## 2012-09-05 DIAGNOSIS — I129 Hypertensive chronic kidney disease with stage 1 through stage 4 chronic kidney disease, or unspecified chronic kidney disease: Secondary | ICD-10-CM | POA: Insufficient documentation

## 2012-09-05 DIAGNOSIS — K5289 Other specified noninfective gastroenteritis and colitis: Secondary | ICD-10-CM | POA: Insufficient documentation

## 2012-09-05 DIAGNOSIS — Z7901 Long term (current) use of anticoagulants: Secondary | ICD-10-CM | POA: Insufficient documentation

## 2012-09-05 DIAGNOSIS — Z8673 Personal history of transient ischemic attack (TIA), and cerebral infarction without residual deficits: Secondary | ICD-10-CM | POA: Insufficient documentation

## 2012-09-05 HISTORY — DX: Transient cerebral ischemic attack, unspecified: G45.9

## 2012-09-05 LAB — CBC WITH DIFFERENTIAL/PLATELET
Hemoglobin: 15.8 g/dL — ABNORMAL HIGH (ref 12.0–15.0)
Lymphocytes Relative: 3 % — ABNORMAL LOW (ref 12–46)
Lymphs Abs: 0.5 10*3/uL — ABNORMAL LOW (ref 0.7–4.0)
MCH: 30.7 pg (ref 26.0–34.0)
MCV: 93.8 fL (ref 78.0–100.0)
Monocytes Relative: 4 % (ref 3–12)
Neutrophils Relative %: 93 % — ABNORMAL HIGH (ref 43–77)
Platelets: 257 10*3/uL (ref 150–400)
RBC: 5.15 MIL/uL — ABNORMAL HIGH (ref 3.87–5.11)
WBC: 18 10*3/uL — ABNORMAL HIGH (ref 4.0–10.5)

## 2012-09-05 LAB — BASIC METABOLIC PANEL
BUN: 28 mg/dL — ABNORMAL HIGH (ref 6–23)
CO2: 30 mEq/L (ref 19–32)
Glucose, Bld: 113 mg/dL — ABNORMAL HIGH (ref 70–99)
Potassium: 4 mEq/L (ref 3.5–5.1)
Sodium: 139 mEq/L (ref 135–145)

## 2012-09-05 LAB — HEPATIC FUNCTION PANEL
Albumin: 3.9 g/dL (ref 3.5–5.2)
Total Bilirubin: 0.6 mg/dL (ref 0.3–1.2)

## 2012-09-05 MED ORDER — SODIUM CHLORIDE 0.9 % IV SOLN: Freq: Once | INTRAVENOUS | Status: DC

## 2012-09-05 MED ORDER — SODIUM CHLORIDE 0.9 % IV BOLUS (SEPSIS)
1000.0000 mL | Freq: Once | INTRAVENOUS | Status: AC
  Administered 2012-09-05: 1000 mL via INTRAVENOUS

## 2012-09-05 MED ORDER — ONDANSETRON HCL 4 MG/2ML IJ SOLN
4.0000 mg | Freq: Once | INTRAMUSCULAR | Status: AC
  Administered 2012-09-05: 4 mg via INTRAVENOUS
  Filled 2012-09-05: qty 2

## 2012-09-05 NOTE — ED Notes (Signed)
Son reports pt woke up around 0430 this morning with n/v/d.  Pt denies abd pain.

## 2012-09-05 NOTE — ED Notes (Signed)
Pt presents with n/v/d multiple times since 0430. Pt

## 2012-09-05 NOTE — ED Notes (Signed)
Pt's son is the historian at this time. Pt has a history of dementia, son is the primary care giver. Pt is resting with no c/o voiced at this time. NAD noted

## 2012-09-05 NOTE — ED Provider Notes (Signed)
History   This chart was scribed for Benny Lennert, MD by Charolett Bumpers, ED Scribe. The patient was seen in room APA08/APA08. Patient's care was started at 1439.   CSN: [id number]  Arrival date & time 09/05/12  1125   First MD Initiated Contact with Patient 09/05/12 1439      Chief Complaint  Patient presents with  . Emesis   Level V Caveat: Hx of dementia.   Angela [name] is a 78 y.o. female who presents to the Emergency Department complaining of multiple episodes of vomiting and diarrhea since 4:30 this morning. Son states that the pt has has very little to eat, drink and hasn't had any medications. He denies any blood in vomit or stool. Pt denies any complaints of pain.   Patient is a 78 y.o. female presenting with vomiting. The history is provided by a relative. The history is limited by the condition of the patient. No language interpreter was used.  Emesis  This is a new problem. The current episode started 12 to 24 hours ago. The problem occurs 2 to 4 times per day. The problem has been gradually worsening. The emesis has an appearance of stomach contents.    Past Medical History  Diagnosis Date  . ASCVD (arteriosclerotic cardiovascular disease)     acute MI in 1993;40%left main,80%LAD, 100%RCA-PTCA; EF of 40%;stress nuclear in2007; normal EF;  mixed inferolateral ischemia and infarction; CHF with normal EF in 2011  . CHF (congestive heart failure)     normal EF;mixed inferolateral ischemic and infarction;chf with normal EF in 2011  . Hypertension   . Hyperlipidemia   . Cerebrovascular disease     CVA by CT in 2007,but not noted in 2009;duplex in 12/2006 plaque without stenosis; Emergency Room evaluation in 12/2010 for transient aphasia and paresis-clopidogrel initially added to ASA Rx, but subsequently warfarin was substituted for these 2 agents  . Tobacco abuse, in remission     quit in 1995  . Claudication   . Dementia     mild  . Kyphosis     severe  .  Decreased hearing   . Atrial fibrillation   . Chronic anticoagulation   . Chronic kidney disease   . TIA (transient ischemic attack)     Past Surgical History  Procedure Date  . Abdominal hysterectomy   . Appendectomy   . Tonsillectomy   . Enucleation     Right-resulted from infection    Family History  Problem Relation Age of Onset  . Dementia Mother   . Atrial fibrillation Son   . Hypertension Son     History  Substance Use Topics  . Smoking status: Former Smoker    Quit date: 01/05/1994  . Smokeless tobacco: Never Used  . Alcohol Use: No    OB History    Grav Para Term Preterm Abortions TAB SAB Ect Mult Living                  Review of Systems  Unable to perform ROS: Dementia  Gastrointestinal: Positive for vomiting.    Allergies  Haldol; Sulfa antibiotics; and Sulfonamide derivatives  Home Medications   Current Outpatient Rx  Name  Route  Sig  Dispense  Refill  . ALBUTEROL SULFATE (2.5 MG/3ML) 0.083% IN NEBU               . IPRATROPIUM-ALBUTEROL 18-103 MCG/ACT IN AERO   Inhalation   Inhale 2 puffs into the lungs every 6 (six) hours  as needed. For shortness of breath         . ALENDRONATE SODIUM 70 MG PO TABS   Oral   Take 70 mg by mouth every 7 (seven) days. Take with a full glass of water on an empty stomach.          . CETIRIZINE HCL 10 MG PO TABS   Oral   Take 10 mg by mouth daily.           Marland Kitchen VITAMIN D 1000 UNITS PO CAPS   Oral   Take 1,000 Units by mouth daily.           Ocie Bob PO PACK   Oral   Take 40 g by mouth daily.           . DONEPEZIL HCL 5 MG PO TABS   Oral   Take 10 mg by mouth at bedtime.          . FUROSEMIDE 40 MG PO TABS   Oral   Take 40 mg by mouth daily.          Marland Kitchen HALOPERIDOL 0.5 MG PO TABS   Oral   Take 0.5 mg by mouth at bedtime.         Marland Kitchen LISINOPRIL 2.5 MG PO TABS   Oral   Take 2.5 mg by mouth daily.         Marland Kitchen LORAZEPAM 0.5 MG PO TABS   Oral   Take 0.5 mg by mouth every 8  (eight) hours.         Marland Kitchen MEMANTINE HCL 10 MG PO TABS   Oral   Take 10 mg by mouth 2 (two) times daily.          . MULTI-VITAMIN/MINERALS PO TABS   Oral   Take 1 tablet by mouth daily.           Marland Kitchen NITROGLYCERIN 0.4 MG SL SUBL   Sublingual   Place 0.4 mg under the tongue every 5 (five) minutes as needed. For chest pain          . FISH OIL 1000 MG PO CAPS   Oral   Take 1,200 mg by mouth 2 (two) times daily.          Marland Kitchen POTASSIUM CHLORIDE CRYS ER 20 MEQ PO TBCR   Oral   Take 20 mEq by mouth daily.         Marland Kitchen PRAVASTATIN SODIUM 40 MG PO TABS   Oral   Take 1 tablet (40 mg total) by mouth at bedtime.   30 tablet   10   . TRIAZOLAM 0.125 MG PO TABS   Oral   Take 0.125 mg by mouth at bedtime as needed.         Marland Kitchen VITAMIN C 500 MG PO TABS   Oral   Take 500 mg by mouth daily.         . WARFARIN SODIUM 2.5 MG PO TABS   Oral   Take 2.5 mg by mouth daily. TAKE 1 TABLET (2.5 MG TOTAL) BY MOUTH DAILY. TAKE 1 TABLET DAILY EXCEPT 1/2 TABLET ON T,TH,SAT         . WARFARIN SODIUM 2.5 MG PO TABS   Oral   Take 1 tablet (2.5 mg total) by mouth daily.   30 tablet   3   . WARFARIN SODIUM 2.5 MG PO TABS      TAKE 1 TABLET (2.5 MG TOTAL) BY MOUTH DAILY. TAKE 1  TABLET DAILY EXCEPT 1/2 TABLET ON T,TH,SAT   30 tablet   3     BP 93/73  Pulse 88  Temp 98.2 F (36.8 C) (Oral)  Resp 16  Wt 123 lb (55.792 kg)  SpO2 98%  Physical Exam  Nursing note and vitals reviewed. Constitutional: She appears well-developed.  HENT:  Head: Normocephalic and atraumatic.  Eyes: Conjunctivae normal and EOM are normal. No scleral icterus.  Neck: Neck supple. No thyromegaly present.  Cardiovascular: Normal rate, regular rhythm and normal heart sounds.  Exam reveals no gallop and no friction rub.   No murmur heard. Pulmonary/Chest: Effort normal and breath sounds normal. No stridor. She has no wheezes. She has no rales. She exhibits no tenderness.  Abdominal: Soft. Bowel sounds are  normal. She exhibits no distension. There is no tenderness. There is no rebound.  Musculoskeletal: Normal range of motion. She exhibits no edema.  Lymphadenopathy:    She has no cervical adenopathy.  Neurological: She is alert. Coordination normal.       Only oriented to self.   Skin: No rash noted. No erythema.  Psychiatric: She has a normal mood and affect. Her behavior is normal.    ED Course  Procedures (including critical care time)  DIAGNOSTIC STUDIES: Oxygen Saturation is 98% on room air, normal by my interpretation.    COORDINATION OF CARE:  14:45-Discussed planned course of treatment with the son, including IV fluids, Zofran, x-ray of abdomen and blood work, who is agreeable at this time.   15:00-Medication Orders: Sodium chloride 0.9% bolus 1,000 mL-once; Ondansetron (Zofran) injection 4 mg-once.   Results for orders placed during the hospital encounter of 09/05/12  CBC WITH DIFFERENTIAL      Component Value Range   WBC 18.0 (*) 4.0 - 10.5 K/uL   RBC 5.15 (*) 3.87 - 5.11 MIL/uL   Hemoglobin 15.8 (*) 12.0 - 15.0 g/dL   HCT 16.1 (*) 09.6 - 04.5 %   MCV 93.8  78.0 - 100.0 fL   MCH 30.7  26.0 - 34.0 pg   MCHC 32.7  30.0 - 36.0 g/dL   RDW 40.9  81.1 - 91.4 %   Platelets 257  150 - 400 K/uL   Neutrophils Relative 93 (*) 43 - 77 %   Neutro Abs 16.8 (*) 1.7 - 7.7 K/uL   Lymphocytes Relative 3 (*) 12 - 46 %   Lymphs Abs 0.5 (*) 0.7 - 4.0 K/uL   Monocytes Relative 4  3 - 12 %   Monocytes Absolute 0.7  0.1 - 1.0 K/uL   Eosinophils Relative 0  0 - 5 %   Eosinophils Absolute 0.1  0.0 - 0.7 K/uL   Basophils Relative 0  0 - 1 %   Basophils Absolute 0.0  0.0 - 0.1 K/uL  BASIC METABOLIC PANEL      Component Value Range   Sodium 139  135 - 145 mEq/L   Potassium 4.0  3.5 - 5.1 mEq/L   Chloride 100  96 - 112 mEq/L   CO2 30  19 - 32 mEq/L   Glucose, Bld 113 (*) 70 - 99 mg/dL   BUN 28 (*) 6 - 23 mg/dL   Creatinine, Ser 7.82 (*) 0.50 - 1.10 mg/dL   Calcium 95.6  8.4 - 21.3  mg/dL   GFR calc non Af Amer 35 (*) >90 mL/min   GFR calc Af Amer 41 (*) >90 mL/min  HEPATIC FUNCTION PANEL      Component Value Range  Total Protein 7.3  6.0 - 8.3 g/dL   Albumin 3.9  3.5 - 5.2 g/dL   AST 19  0 - 37 U/L   ALT 14  0 - 35 U/L   Alkaline Phosphatase 113  39 - 117 U/L   Total Bilirubin 0.6  0.3 - 1.2 mg/dL   Bilirubin, Direct 0.1  0.0 - 0.3 mg/dL   Indirect Bilirubin 0.5  0.3 - 0.9 mg/dL    Dg Abd Acute W/chest  09/05/2012  *RADIOLOGY REPORT*  Clinical Data: Vomiting.  The patient feels ill.  ACUTE ABDOMEN SERIES (ABDOMEN 2 VIEW & CHEST 1 VIEW)  Comparison: One-view chest 01/29/2012.  Findings: The heart is mildly enlarged.  Nodular density in the right upper lobe is stable.  Emphysematous changes are present. Atherosclerotic calcifications are noted in the thoracic and abdominal aorta.  Supine and upright views of the abdomen demonstrate nonspecific bowel gas pattern.  There is no evidence for obstruction or free air. Flocculent calcifications in the right upper quadrant are stable.  Atherosclerotic calcifications are noted in the aorta and branch vessels.  There is no evidence for obstruction or free air. Postoperative changes are noted at L5.  Degenerative changes are present in the lower lumbar spine and to a lesser extent in the hip joints bilaterally.  IMPRESSION:  1.  Stable cardiomegaly without failure. 2.  Extensive atherosclerotic changes. 3.  Calcifications in the upper quadrant are stable. 4.  Degenerative and postoperative changes of the lower lumbar spine.   Original Report Authenticated By: Marin Roberts, M.D.      No diagnosis found.  bp at d/c  141/111  MDM    The chart was scribed for me under my direct supervision.  I personally performed the history, physical, and medical decision making and all procedures in the evaluation of this patient.Benny Lennert, MD 09/05/12 [phone]

## 2012-10-04 ENCOUNTER — Telehealth: Payer: Self-pay | Admitting: Cardiology

## 2012-10-04 NOTE — Telephone Encounter (Signed)
Patient no showed appointment.  Left message on machine for patient to call office to reschedule.  Letter also mailed. / tgs

## 2012-10-10 ENCOUNTER — Ambulatory Visit (INDEPENDENT_AMBULATORY_CARE_PROVIDER_SITE_OTHER): Payer: PRIVATE HEALTH INSURANCE | Admitting: *Deleted

## 2012-10-10 DIAGNOSIS — I4891 Unspecified atrial fibrillation: Secondary | ICD-10-CM

## 2012-10-10 DIAGNOSIS — Z7901 Long term (current) use of anticoagulants: Secondary | ICD-10-CM

## 2012-10-10 LAB — POCT INR: INR: 3.1

## 2012-10-15 ENCOUNTER — Encounter (HOSPITAL_COMMUNITY): Payer: Self-pay

## 2012-10-15 ENCOUNTER — Emergency Department (HOSPITAL_COMMUNITY): Payer: PRIVATE HEALTH INSURANCE

## 2012-10-15 ENCOUNTER — Emergency Department (HOSPITAL_COMMUNITY)
Admission: EM | Admit: 2012-10-15 | Discharge: 2012-10-15 | Disposition: A | Discharge status: Home / Self Care | Payer: PRIVATE HEALTH INSURANCE | Attending: Emergency Medicine | Admitting: Emergency Medicine

## 2012-10-15 DIAGNOSIS — Z8739 Personal history of other diseases of the musculoskeletal system and connective tissue: Secondary | ICD-10-CM | POA: Insufficient documentation

## 2012-10-15 DIAGNOSIS — Z79899 Other long term (current) drug therapy: Secondary | ICD-10-CM | POA: Insufficient documentation

## 2012-10-15 DIAGNOSIS — E785 Hyperlipidemia, unspecified: Secondary | ICD-10-CM | POA: Insufficient documentation

## 2012-10-15 DIAGNOSIS — Z8673 Personal history of transient ischemic attack (TIA), and cerebral infarction without residual deficits: Secondary | ICD-10-CM | POA: Insufficient documentation

## 2012-10-15 DIAGNOSIS — S41119A Laceration without foreign body of unspecified upper arm, initial encounter: Secondary | ICD-10-CM

## 2012-10-15 DIAGNOSIS — Y939 Activity, unspecified: Secondary | ICD-10-CM | POA: Insufficient documentation

## 2012-10-15 DIAGNOSIS — I509 Heart failure, unspecified: Secondary | ICD-10-CM | POA: Insufficient documentation

## 2012-10-15 DIAGNOSIS — I7090 Unspecified atherosclerosis: Secondary | ICD-10-CM | POA: Insufficient documentation

## 2012-10-15 DIAGNOSIS — F0390 Unspecified dementia without behavioral disturbance: Secondary | ICD-10-CM | POA: Insufficient documentation

## 2012-10-15 DIAGNOSIS — Z7901 Long term (current) use of anticoagulants: Secondary | ICD-10-CM | POA: Insufficient documentation

## 2012-10-15 DIAGNOSIS — N189 Chronic kidney disease, unspecified: Secondary | ICD-10-CM | POA: Insufficient documentation

## 2012-10-15 DIAGNOSIS — S41109A Unspecified open wound of unspecified upper arm, initial encounter: Secondary | ICD-10-CM | POA: Insufficient documentation

## 2012-10-15 DIAGNOSIS — I10 Essential (primary) hypertension: Secondary | ICD-10-CM | POA: Insufficient documentation

## 2012-10-15 DIAGNOSIS — Y92009 Unspecified place in unspecified non-institutional (private) residence as the place of occurrence of the external cause: Secondary | ICD-10-CM | POA: Insufficient documentation

## 2012-10-15 DIAGNOSIS — Z8679 Personal history of other diseases of the circulatory system: Secondary | ICD-10-CM | POA: Insufficient documentation

## 2012-10-15 DIAGNOSIS — W19XXXA Unspecified fall, initial encounter: Secondary | ICD-10-CM | POA: Insufficient documentation

## 2012-10-15 DIAGNOSIS — Z87891 Personal history of nicotine dependence: Secondary | ICD-10-CM | POA: Insufficient documentation

## 2012-10-15 LAB — CBC WITH DIFFERENTIAL/PLATELET
Basophils Absolute: 0.1 10*3/uL (ref 0.0–0.1)
HCT: 40.8 % (ref 36.0–46.0)
Lymphocytes Relative: 27 % (ref 12–46)
Neutro Abs: 3.8 10*3/uL (ref 1.7–7.7)
Platelets: 225 10*3/uL (ref 150–400)
RBC: 4.34 MIL/uL (ref 3.87–5.11)
RDW: 13.8 % (ref 11.5–15.5)
WBC: 6.8 10*3/uL (ref 4.0–10.5)

## 2012-10-15 LAB — URINALYSIS, ROUTINE W REFLEX MICROSCOPIC
Glucose, UA: NEGATIVE mg/dL
Leukocytes, UA: NEGATIVE
Specific Gravity, Urine: 1.01 (ref 1.005–1.030)
pH: 7.5 (ref 5.0–8.0)

## 2012-10-15 LAB — BASIC METABOLIC PANEL
CO2: 36 mEq/L — ABNORMAL HIGH (ref 19–32)
Chloride: 101 mEq/L (ref 96–112)
Sodium: 141 mEq/L (ref 135–145)

## 2012-10-15 LAB — PROTIME-INR: Prothrombin Time: 25.7 seconds — ABNORMAL HIGH (ref 11.6–15.2)

## 2012-10-15 NOTE — ED Notes (Signed)
Last tet shot 05/02/2011 per dr Phillips [facility] office

## 2012-10-15 NOTE — ED Notes (Signed)
Pt here for evaluation of falls. Family stated to EMS pt fell twice last night. Has two skin tears to right forearm

## 2012-10-15 NOTE — ED Provider Notes (Signed)
History     CSN: [id number]  Arrival date & time 10/15/12  0913   First MD Initiated Contact with Patient 10/15/12 [phone]      Chief Complaint  Patient presents with  . Extremity Weakness    (Consider location/radiation/quality/duration/timing/severity/associated sxs/prior treatment) HPI Comments: Angela [name] is a 79 y.o. Female presenting with increasing falls over the past 2 days.  She lives at home with her son and has a in home aide when the son is at work.  She fell twice since last night,  The first occuring when her wheelchair was caught coming through a door and the second was unwitnessed this am,  Son reporting he heard her fall from the next room and she was found sitting on the floor, alert and conscious.  She has sustained abrasion and skin tears to her right forearm and elbow which her son cleaned with peroxide,  Then applied opsite after approximating the edges.  (She has a well healing tear on her left forearm as well).  She has pain along her right forearm,  And no complaints of pain elsewhere.  There is no evidence of head injury.  She denies dizziness, chest pain,  Headache, nausea, vomiting,  Shortness of breath and focal weakness.  She does have mild dementia.     The history is provided by the patient and a relative.    Past Medical History  Diagnosis Date  . ASCVD (arteriosclerotic cardiovascular disease)     acute MI in 1993;40%left main,80%LAD, 100%RCA-PTCA; EF of 40%;stress nuclear in2007; normal EF;  mixed inferolateral ischemia and infarction; CHF with normal EF in 2011  . CHF (congestive heart failure)     normal EF;mixed inferolateral ischemic and infarction;chf with normal EF in 2011  . Hypertension   . Hyperlipidemia   . Cerebrovascular disease     CVA by CT in 2007,but not noted in 2009;duplex in 12/2006 plaque without stenosis; Emergency Room evaluation in 12/2010 for transient aphasia and paresis-clopidogrel initially added to ASA Rx, but subsequently  warfarin was substituted for these 2 agents  . Tobacco abuse, in remission     quit in 1995  . Claudication   . Dementia     mild  . Kyphosis     severe  . Decreased hearing   . Atrial fibrillation   . Chronic anticoagulation   . Chronic kidney disease   . TIA (transient ischemic attack)     Past Surgical History  Procedure Date  . Abdominal hysterectomy   . Appendectomy   . Tonsillectomy   . Enucleation     Right-resulted from infection    Family History  Problem Relation Age of Onset  . Dementia Mother   . Atrial fibrillation Son   . Hypertension Son     History  Substance Use Topics  . Smoking status: Former Smoker    Quit date: 01/05/1994  . Smokeless tobacco: Never Used  . Alcohol Use: No    OB History    Grav Para Term Preterm Abortions TAB SAB Ect Mult Living                  Review of Systems  Unable to perform ROS: Dementia  Musculoskeletal: Positive for arthralgias.  Skin: Positive for wound.    Allergies  Haldol; Sulfa antibiotics; and Sulfonamide derivatives  Home Medications   Current Outpatient Rx  Name  Route  Sig  Dispense  Refill  . ALENDRONATE SODIUM 70 MG PO TABS  Oral   Take 70 mg by mouth every 7 (seven) days. Take with a full glass of water on an empty stomach.          . CETIRIZINE HCL 10 MG PO TABS   Oral   Take 10 mg by mouth at bedtime.          Marland Kitchen VITAMIN D 1000 UNITS PO CAPS   Oral   Take 2,000 Units by mouth every morning.          Marland Kitchen CLOBETASOL PROPIONATE 0.05 % EX CREA   Topical   Apply 1 application topically once a week.         Ocie Bob PO PACK   Oral   Take 40 g by mouth daily. 1/2 packet  Given daily in the morning         . DONEPEZIL HCL 10 MG PO TABS   Oral   Take 10 mg by mouth every morning.         . FUROSEMIDE 40 MG PO TABS   Oral   Take 40 mg by mouth daily. *Given on an empty stomach 30 minutes before eating         . LISINOPRIL 2.5 MG PO TABS   Oral   Take 2.5 mg by mouth  every morning.          Marland Kitchen LORAZEPAM 0.5 MG PO TABS   Oral   Take 0.25 mg by mouth 3 (three) times daily as needed. Anxiety and or agitation         . MEMANTINE HCL 10 MG PO TABS   Oral   Take 10 mg by mouth daily.          . MULTI-VITAMIN/MINERALS PO TABS   Oral   Take 1 tablet by mouth every morning.          Marland Kitchen FISH OIL 1000 MG PO CAPS   Oral   Take 1,200 mg by mouth 2 (two) times daily.          Marland Kitchen POLYETHYLENE GLYCOL 3350 PO PACK   Oral   Take 17 g by mouth daily as needed. constip         . POTASSIUM CHLORIDE CRYS ER 20 MEQ PO TBCR   Oral   Take 20 mEq by mouth every morning.          Marland Kitchen PRAVASTATIN SODIUM 40 MG PO TABS   Oral   Take 1 tablet (40 mg total) by mouth at bedtime.   30 tablet   10   . SALICYLIC ACID-SULFUR 2-2 % EX SHAM   Topical   Apply 1 application topically daily as needed. psoriasis         . SALICYLIC ACID 6 % EX SHAM   Apply externally   Apply 1 application topically once a week. For psoriasis         . TRIAZOLAM 0.125 MG PO TABS   Oral   Take 0.125 mg by mouth at bedtime as needed. For sleep         . VITAMIN C 500 MG PO TABS   Oral   Take 500 mg by mouth every morning.          . WARFARIN SODIUM 2.5 MG PO TABS   Oral   Take 2.5 mg by mouth daily.         . ALBUTEROL SULFATE (2.5 MG/3ML) 0.083% IN NEBU   Nebulization   Take 2.5 mg by nebulization  every 4 (four) hours as needed. For shortness of breath/cough         . IPRATROPIUM-ALBUTEROL 18-103 MCG/ACT IN AERO   Inhalation   Inhale 2 puffs into the lungs every 6 (six) hours as needed. For shortness of breath         . NITROGLYCERIN 0.4 MG SL SUBL   Sublingual   Place 0.4 mg under the tongue every 5 (five) minutes as needed. For chest pain            BP 110/50  Pulse 64  Temp 97.5 F (36.4 C) (Oral)  Resp 16  SpO2 98%  Physical Exam  Nursing note and vitals reviewed. Constitutional: She appears well-developed and well-nourished.  HENT:    Head: Normocephalic and atraumatic.  Right Ear: No hemotympanum.  Left Ear: No hemotympanum.  Eyes: Conjunctivae normal are normal.  Neck: Normal range of motion.  Cardiovascular: Normal rate, regular rhythm, normal heart sounds and intact distal pulses.   Pulmonary/Chest: Effort normal and breath sounds normal. She has no wheezes.  Abdominal: Soft. Bowel sounds are normal. There is no tenderness.  Musculoskeletal: Normal range of motion.       Left forearm: She exhibits tenderness.  Neurological: She is alert. No cranial nerve deficit or sensory deficit. Coordination normal.       No cranial nerve deficit,  Except for right eyelid droop which is chronic per son at bedside.  Skin: Skin is warm and dry. Abrasion noted.       Abrasion/ skin tears right dorsal forearm,  Well approximated and hemostatic,  Dressing in place.  Psychiatric: She has a normal mood and affect.    ED Course  Procedures (including critical care time)  Labs Reviewed  BASIC METABOLIC PANEL - Abnormal; Notable for the following:    CO2 36 (*)     Creatinine, Ser 1.34 (*)     GFR calc non Af Amer 34 (*)     GFR calc Af Amer 39 (*)     All other components within normal limits  PROTIME-INR - Abnormal; Notable for the following:    Prothrombin Time 25.7 (*)     INR 2.48 (*)     All other components within normal limits  CBC WITH DIFFERENTIAL  URINALYSIS, ROUTINE W REFLEX MICROSCOPIC   Dg Chest 1 View  10/15/2012  *RADIOLOGY REPORT*  Clinical Data: Extremity weakness and pain.  CHEST - 1 VIEW  Comparison: 09/05/2012.  Findings: Trachea is midline.  Heart is enlarged and accentuated by AP technique.  Lungs are somewhat low in volume with calcified granulomas in the right perihilar region.  Lungs are otherwise clear.  No pleural fluid.  IMPRESSION: Low lung volumes.  No acute findings.   Original Report Authenticated By: Leanna Battles, M.D.    Dg Forearm Right  10/15/2012  *RADIOLOGY REPORT*  Clinical Data:  Weakness and pain.  RIGHT FOREARM - 2 VIEW  Comparison: None.  Findings: No acute osseous abnormality.  IMPRESSION: No acute osseous abnormality.   Original Report Authenticated By: Leanna Battles, M.D.    Dg Ankle Complete Left  10/15/2012  *RADIOLOGY REPORT*  Clinical Data: Left ankle pain.  LEFT ANKLE COMPLETE - 3+ VIEW  Comparison: 08/22/2011.  Findings: Percutaneous pins are seen in the medial malleolus. Lateral plate and screw fixation of the distal fibula is again seen.  Mild soft tissue swelling about the ankle joint.  No evidence of acute fracture or hardware lucency.  Calcaneal spurs.  IMPRESSION: Healed medial  and lateral malleolar fractures without acute osseous abnormality.   Original Report Authenticated By: Leanna Battles, M.D.    Ct Head Wo Contrast  10/15/2012  *RADIOLOGY REPORT*  Clinical Data: Larey Seat last night.  Mental status changes.  Dementia.  CT HEAD WITHOUT CONTRAST  Technique:  Contiguous axial images were obtained from the base of the skull through the vertex without contrast.  Comparison: 01/29/2012  Findings: The brain again shows generalized atrophy with chronic small vessel changes within the white matter and chronic volume loss in the right parietal region with ex vacuo enlargement of the right lateral ventricle.  Numerous chronic calcified and/or embolized vascular structures remain evident at the vertex, presumably related to chronic arteriovenous malformation. No sign of acute infarction, neoplastic mass lesion, hemorrhage, hydrocephalus or extra-axial collection.  No skull fracture.  Right hip prosthesis again noted.  IMPRESSION: No acute or traumatic finding.  Atrophy and chronic small vessel disease.  Chronic volume loss in the right parietal region related to chronic vascular malformation in that is calcified and/or embolized.   Original Report Authenticated By: Paulina Fusi, M.D.      1. Fall at home   2. Skin tear of upper arm without complication       MDM  Pt  was also seen by edp prior to dc home.  Son at bedside comfortable with home care at this time.  No need for additional tx to skin tears as they are approximated and dressed appropriately.  Tetanus utd per pcp's office.  xrays , labs reviewed prior to dc home.    Date: 10/15/2012  Rate: 74  Rhythm: atrial fibrillation  QRS Axis: normal  Intervals: QT prolonged  ST/T Wave abnormalities: nonspecific ST changes  Conduction Disutrbances:right bundle branch block  Narrative Interpretation:   Old EKG Reviewed: unchanged       Burgess Amor, PA 10/17/12 1205

## 2012-10-17 NOTE — ED Provider Notes (Signed)
Medical screening examination/treatment/procedure(s) were conducted as a shared visit with non-physician practitioner(s) and myself.  I personally evaluated the patient during the encounter  Pt feels improved.  Patient and family feel comfortable for d/c home.    Joya Gaskins, MD 10/17/12 [phone]

## 2012-11-07 ENCOUNTER — Ambulatory Visit (INDEPENDENT_AMBULATORY_CARE_PROVIDER_SITE_OTHER): Payer: PRIVATE HEALTH INSURANCE | Admitting: *Deleted

## 2012-11-07 DIAGNOSIS — I4891 Unspecified atrial fibrillation: Secondary | ICD-10-CM

## 2012-11-07 DIAGNOSIS — Z7901 Long term (current) use of anticoagulants: Secondary | ICD-10-CM

## 2012-11-07 LAB — POCT INR: INR: 3.2

## 2012-11-12 ENCOUNTER — Emergency Department (HOSPITAL_COMMUNITY): Payer: PRIVATE HEALTH INSURANCE

## 2012-11-12 ENCOUNTER — Emergency Department (HOSPITAL_COMMUNITY)
Admission: EM | Admit: 2012-11-12 | Discharge: 2012-11-12 | Disposition: A | Discharge status: Home / Self Care | Payer: PRIVATE HEALTH INSURANCE | Attending: Emergency Medicine | Admitting: Emergency Medicine

## 2012-11-12 ENCOUNTER — Encounter (HOSPITAL_COMMUNITY): Payer: Self-pay | Admitting: *Deleted

## 2012-11-12 DIAGNOSIS — I509 Heart failure, unspecified: Secondary | ICD-10-CM | POA: Insufficient documentation

## 2012-11-12 DIAGNOSIS — R5383 Other fatigue: Secondary | ICD-10-CM | POA: Insufficient documentation

## 2012-11-12 DIAGNOSIS — Z87891 Personal history of nicotine dependence: Secondary | ICD-10-CM | POA: Insufficient documentation

## 2012-11-12 DIAGNOSIS — Z8679 Personal history of other diseases of the circulatory system: Secondary | ICD-10-CM | POA: Insufficient documentation

## 2012-11-12 DIAGNOSIS — Z8659 Personal history of other mental and behavioral disorders: Secondary | ICD-10-CM | POA: Insufficient documentation

## 2012-11-12 DIAGNOSIS — D75829 Heparin-induced thrombocytopenia, unspecified: Secondary | ICD-10-CM | POA: Insufficient documentation

## 2012-11-12 DIAGNOSIS — I129 Hypertensive chronic kidney disease with stage 1 through stage 4 chronic kidney disease, or unspecified chronic kidney disease: Secondary | ICD-10-CM | POA: Insufficient documentation

## 2012-11-12 DIAGNOSIS — D6832 Hemorrhagic disorder due to extrinsic circulating anticoagulants: Secondary | ICD-10-CM

## 2012-11-12 DIAGNOSIS — E860 Dehydration: Secondary | ICD-10-CM | POA: Insufficient documentation

## 2012-11-12 DIAGNOSIS — N189 Chronic kidney disease, unspecified: Secondary | ICD-10-CM | POA: Insufficient documentation

## 2012-11-12 DIAGNOSIS — R5381 Other malaise: Secondary | ICD-10-CM | POA: Insufficient documentation

## 2012-11-12 DIAGNOSIS — Z7901 Long term (current) use of anticoagulants: Secondary | ICD-10-CM | POA: Insufficient documentation

## 2012-11-12 DIAGNOSIS — Z8673 Personal history of transient ischemic attack (TIA), and cerebral infarction without residual deficits: Secondary | ICD-10-CM | POA: Insufficient documentation

## 2012-11-12 DIAGNOSIS — H9190 Unspecified hearing loss, unspecified ear: Secondary | ICD-10-CM | POA: Insufficient documentation

## 2012-11-12 DIAGNOSIS — R531 Weakness: Secondary | ICD-10-CM

## 2012-11-12 DIAGNOSIS — Z79899 Other long term (current) drug therapy: Secondary | ICD-10-CM | POA: Insufficient documentation

## 2012-11-12 DIAGNOSIS — D7582 Heparin induced thrombocytopenia (HIT): Secondary | ICD-10-CM | POA: Insufficient documentation

## 2012-11-12 DIAGNOSIS — Z8739 Personal history of other diseases of the musculoskeletal system and connective tissue: Secondary | ICD-10-CM | POA: Insufficient documentation

## 2012-11-12 DIAGNOSIS — D689 Coagulation defect, unspecified: Secondary | ICD-10-CM | POA: Insufficient documentation

## 2012-11-12 DIAGNOSIS — E785 Hyperlipidemia, unspecified: Secondary | ICD-10-CM | POA: Insufficient documentation

## 2012-11-12 LAB — COMPREHENSIVE METABOLIC PANEL
AST: 22 U/L (ref 0–37)
CO2: 29 mEq/L (ref 19–32)
Chloride: 97 mEq/L (ref 96–112)
Creatinine, Ser: 1.34 mg/dL — ABNORMAL HIGH (ref 0.50–1.10)
GFR calc Af Amer: 39 mL/min — ABNORMAL LOW (ref >90)
GFR calc non Af Amer: 34 mL/min — ABNORMAL LOW (ref >90)
Glucose, Bld: 113 mg/dL — ABNORMAL HIGH (ref 70–99)
Total Bilirubin: 0.5 mg/dL (ref 0.3–1.2)

## 2012-11-12 LAB — URINALYSIS, ROUTINE W REFLEX MICROSCOPIC
Bilirubin Urine: NEGATIVE
Hgb urine dipstick: NEGATIVE
Nitrite: NEGATIVE
Protein, ur: NEGATIVE mg/dL
Urobilinogen, UA: 0.2 mg/dL (ref 0.0–1.0)

## 2012-11-12 LAB — CBC WITH DIFFERENTIAL/PLATELET
Basophils Absolute: 0 10*3/uL (ref 0.0–0.1)
Eosinophils Relative: 1 % (ref 0–5)
HCT: 47.1 % — ABNORMAL HIGH (ref 36.0–46.0)
Hemoglobin: 15.3 g/dL — ABNORMAL HIGH (ref 12.0–15.0)
Lymphocytes Relative: 4 % — ABNORMAL LOW (ref 12–46)
Lymphs Abs: 0.5 10*3/uL — ABNORMAL LOW (ref 0.7–4.0)
MCV: 92.4 fL (ref 78.0–100.0)
Monocytes Absolute: 0.5 10*3/uL (ref 0.1–1.0)
Monocytes Relative: 4 % (ref 3–12)
Neutro Abs: 11.7 10*3/uL — ABNORMAL HIGH (ref 1.7–7.7)
RBC: 5.1 MIL/uL (ref 3.87–5.11)
RDW: 14 % (ref 11.5–15.5)
WBC: 12.8 10*3/uL — ABNORMAL HIGH (ref 4.0–10.5)

## 2012-11-12 LAB — PROTIME-INR
INR: 3.13 — ABNORMAL HIGH (ref 0.00–1.49)
Prothrombin Time: 30.5 seconds — ABNORMAL HIGH (ref 11.6–15.2)

## 2012-11-12 MED ORDER — SODIUM CHLORIDE 0.9 % IV BOLUS (SEPSIS)
700.0000 mL | Freq: Once | INTRAVENOUS | Status: AC
  Administered 2012-11-12: 700 mL via INTRAVENOUS

## 2012-11-12 NOTE — ED Notes (Signed)
Pt ambulated to the restroom.

## 2012-11-12 NOTE — ED Provider Notes (Signed)
History    This chart was scribed for Ward Givens, MD by Charolett Bumpers, ED Scribe. The patient was seen in room APA19/APA19. Patient's care was started at 1607.   CSN: [id number]  Arrival date & time 11/12/12  1535   First MD Initiated Contact with Patient 11/12/12 1607      No chief complaint on file.  Level V Caveat: Hx and ROS limited due to h/o dementia  The history is provided by a relative. The history is limited by the condition of the patient. No language interpreter was used.   Angela [name] is a 79 y.o. female who presents to the Emergency Department complaining of gradually worsening, moderate generalized weakness. Son states that today, the caretaker who stays with the pt during the day reported the pt refused to take her medications, has had decreased fluid intake and is generally weak and wobbley when she ambulates. He states that this morning about 2 am the pt was wanted to see her mother and go home but states that this isn't new. He denies any vomiting, diarrhea but states she complained of nausea last night. He denies any recent falls. He states she has an appointment with Dr Sherwood Gambler tomorrow at 3:30 pm. He states she was seen 2 weeks ago after she had weakness and speech disturbance. He states an extensive work up was preformed at that time but nothing was found per son. She has a h/o 4 TIA's in the past.   PCP: Dr. Sherwood Gambler Cardiologist: Dr. Dietrich Pates  Past Medical History  Diagnosis Date  . ASCVD (arteriosclerotic cardiovascular disease)     acute MI in 1993;40%left main,80%LAD, 100%RCA-PTCA; EF of 40%;stress nuclear in2007; normal EF;  mixed inferolateral ischemia and infarction; CHF with normal EF in 2011  . CHF (congestive heart failure)     normal EF;mixed inferolateral ischemic and infarction;chf with normal EF in 2011  . Hypertension   . Hyperlipidemia   . Cerebrovascular disease     CVA by CT in 2007,but not noted in 2009;duplex in 12/2006 plaque without  stenosis; Emergency Room evaluation in 12/2010 for transient aphasia and paresis-clopidogrel initially added to ASA Rx, but subsequently warfarin was substituted for these 2 agents  . Tobacco abuse, in remission     quit in 1995  . Claudication   . Dementia     mild  . Kyphosis     severe  . Decreased hearing   . Atrial fibrillation   . Chronic anticoagulation   . Chronic kidney disease   . TIA (transient ischemic attack)     Past Surgical History  Procedure Laterality Date  . Abdominal hysterectomy    . Appendectomy    . Tonsillectomy    . Enucleation      Right-resulted from infection    Family History  Problem Relation Age of Onset  . Dementia Mother   . Atrial fibrillation Son   . Hypertension Son     History  Substance Use Topics  . Smoking status: Former Smoker    Quit date: 01/05/1994  . Smokeless tobacco: Never Used  . Alcohol Use: No  Lives with Son Uses a walker Has in home sitter when son at work  OB History   Grav Para Term Preterm Abortions TAB SAB Ect Mult Living                  Review of Systems  Unable to perform ROS: Dementia    Allergies  Haldol; Sulfa  antibiotics; and Sulfonamide derivatives  Home Medications   Current Outpatient Rx  Name  Route  Sig  Dispense  Refill  . albuterol (PROVENTIL) (2.5 MG/3ML) 0.083% nebulizer solution   Nebulization   Take 2.5 mg by nebulization every 4 (four) hours as needed. For shortness of breath/cough         . albuterol-ipratropium (COMBIVENT) 18-103 MCG/ACT inhaler   Inhalation   Inhale 2 puffs into the lungs every 6 (six) hours as needed. For shortness of breath         . alendronate (FOSAMAX) 70 MG tablet   Oral   Take 70 mg by mouth every 7 (seven) days. Take with a full glass of water on an empty stomach.          . cetirizine (ZYRTEC) 10 MG tablet   Oral   Take 10 mg by mouth at bedtime.          . Cholecalciferol (VITAMIN D) 1000 UNITS capsule   Oral   Take 2,000 Units by  mouth every morning.          . clobetasol cream (TEMOVATE) 0.05 %   Topical   Apply 1 application topically once a week.         . Dietary Management Product (AXONA) packet   Oral   Take 40 g by mouth daily. 1/2 packet  Given daily in the morning         . donepezil (ARICEPT) 10 MG tablet   Oral   Take 10 mg by mouth every morning.         . furosemide (LASIX) 40 MG tablet   Oral   Take 40 mg by mouth daily. *Given on an empty stomach 30 minutes before eating         . lisinopril (PRINIVIL,ZESTRIL) 2.5 MG tablet   Oral   Take 2.5 mg by mouth every morning.          Marland Kitchen LORazepam (ATIVAN) 0.5 MG tablet   Oral   Take 0.25 mg by mouth 3 (three) times daily as needed. Anxiety and or agitation         . memantine (NAMENDA) 10 MG tablet   Oral   Take 10 mg by mouth daily.          . Multiple Vitamins-Minerals (MULTIVITAMIN WITH MINERALS) tablet   Oral   Take 1 tablet by mouth every morning.          Marland Kitchen EXPIRED: nitroGLYCERIN (NITROSTAT) 0.4 MG SL tablet   Sublingual   Place 0.4 mg under the tongue every 5 (five) minutes as needed. For chest pain          . Omega-3 Fatty Acids (FISH OIL) 1000 MG CAPS   Oral   Take 1,200 mg by mouth 2 (two) times daily.          . polyethylene glycol (MIRALAX / GLYCOLAX) packet   Oral   Take 17 g by mouth daily as needed. constip         . potassium chloride SA (K-DUR,KLOR-CON) 20 MEQ tablet   Oral   Take 20 mEq by mouth every morning.          . pravastatin (PRAVACHOL) 40 MG tablet   Oral   Take 1 tablet (40 mg total) by mouth at bedtime.   30 tablet   10   . salicyclic acid-sulfur (SEBULEX) 2-2 % shampoo   Topical   Apply 1 application topically daily as needed.  psoriasis         . Salicylic Acid (SALEX) 6 % SHAM   Apply externally   Apply 1 application topically once a week. For psoriasis         . triazolam (HALCION) 0.125 MG tablet   Oral   Take 0.125 mg by mouth at bedtime as needed. For  sleep         . vitamin C (ASCORBIC ACID) 500 MG tablet   Oral   Take 500 mg by mouth every morning.          . warfarin (COUMADIN) 2.5 MG tablet   Oral   Take 2.5 mg by mouth daily.           Triage Vitals: BP 119/60  Pulse 98  Temp(Src) 98.1 F (36.7 C) (Oral)  Wt 124 lb (56.246 kg)  BMI 24.22 kg/m2  SpO2 98%  Vital signs normal    Physical Exam  Nursing note and vitals reviewed. Constitutional: She appears well-developed and well-nourished. No distress.  HENT:  Head: Normocephalic and atraumatic.  Right Ear: External ear normal.  Left Ear: External ear normal.  Nose: Nose normal.  Mouth/Throat: Oropharynx is clear and moist. Mucous membranes are dry. No oropharyngeal exudate.  Tongue is very dry.   Eyes: Conjunctivae and EOM are normal. Pupils are equal, round, and reactive to light.  Right eye is artificial, deviated laterally.   Neck: Normal range of motion. Neck supple. No tracheal deviation present.  Cardiovascular: Normal rate, regular rhythm and normal heart sounds.  Exam reveals no gallop and no friction rub.   No murmur heard. Pulmonary/Chest: Effort normal and breath sounds normal. No respiratory distress. She has no wheezes. She has no rhonchi. She has no rales.  Abdominal: Soft. Bowel sounds are normal. She exhibits no distension. There is no tenderness. There is no rebound and no guarding.  Musculoskeletal: Normal range of motion. She exhibits no edema.  Marked kyphosis  Neurological: She is alert. She has normal strength.  Grip strength equal, no motor deficit. Oriented to place.   Skin: Skin is warm and dry.  Psychiatric: She has a normal mood and affect. Her behavior is normal.    ED Course  Procedures (including critical care time) Medications  sodium chloride 0.9 % bolus 700 mL (0 mLs Intravenous Stopped 11/12/12 1924)     DIAGNOSTIC STUDIES: Oxygen Saturation is 98% on room air, normal by my interpretation.    COORDINATION OF  CARE:  16:18-Discussed planned course of treatment with the son including a chest x-ray, head CT, blood work and UA via in and out cath, who is agreeable at this time.   17:18-Pt informed nursing staff that he had questions regarding tests order. Upon entering the room, pt is more concerned with answering his phone. He states that he only wants a chest x-ray but is refusing a head CT scan. States "she just had one a week ago and we know what is wrong".   19:20-Recheck: Informed son of imaging and lab results. He states that she drank some ice water while in ED. Will ambulate.   20:10-Recheck: Pt is feeling improved, ambulating in ED and is ready to go home. Son states she seems back to her normal.   Results for orders placed during the hospital encounter of 11/12/12  CBC WITH DIFFERENTIAL      Result Value Range   WBC 12.8 (*) 4.0 - 10.5 K/uL   RBC 5.10  3.87 - 5.11 MIL/uL  Hemoglobin 15.3 (*) 12.0 - 15.0 g/dL   HCT 40.9 (*) 81.1 - 91.4 %   MCV 92.4  78.0 - 100.0 fL   MCH 30.0  26.0 - 34.0 pg   MCHC 32.5  30.0 - 36.0 g/dL   RDW 78.2  95.6 - 21.3 %   Platelets 232  150 - 400 K/uL   Neutrophils Relative 91 (*) 43 - 77 %   Neutro Abs 11.7 (*) 1.7 - 7.7 K/uL   Lymphocytes Relative 4 (*) 12 - 46 %   Lymphs Abs 0.5 (*) 0.7 - 4.0 K/uL   Monocytes Relative 4  3 - 12 %   Monocytes Absolute 0.5  0.1 - 1.0 K/uL   Eosinophils Relative 1  0 - 5 %   Eosinophils Absolute 0.1  0.0 - 0.7 K/uL   Basophils Relative 0  0 - 1 %   Basophils Absolute 0.0  0.0 - 0.1 K/uL  COMPREHENSIVE METABOLIC PANEL      Result Value Range   Sodium 136  135 - 145 mEq/L   Potassium 4.4  3.5 - 5.1 mEq/L   Chloride 97  96 - 112 mEq/L   CO2 29  19 - 32 mEq/L   Glucose, Bld 113 (*) 70 - 99 mg/dL   BUN 21  6 - 23 mg/dL   Creatinine, Ser 0.86 (*) 0.50 - 1.10 mg/dL   Calcium 9.7  8.4 - 57.8 mg/dL   Total Protein 7.5  6.0 - 8.3 g/dL   Albumin 3.8  3.5 - 5.2 g/dL   AST 22  0 - 37 U/L   ALT 17  0 - 35 U/L   Alkaline  Phosphatase 103  39 - 117 U/L   Total Bilirubin 0.5  0.3 - 1.2 mg/dL   GFR calc non Af Amer 34 (*) >90 mL/min   GFR calc Af Amer 39 (*) >90 mL/min  URINALYSIS, ROUTINE W REFLEX MICROSCOPIC      Result Value Range   Color, Urine YELLOW  YELLOW   APPearance CLEAR  CLEAR   Specific Gravity, Urine >1.030 (*) 1.005 - 1.030   pH 5.5  5.0 - 8.0   Glucose, UA NEGATIVE  NEGATIVE mg/dL   Hgb urine dipstick NEGATIVE  NEGATIVE   Bilirubin Urine NEGATIVE  NEGATIVE   Ketones, ur TRACE (*) NEGATIVE mg/dL   Protein, ur NEGATIVE  NEGATIVE mg/dL   Urobilinogen, UA 0.2  0.0 - 1.0 mg/dL   Nitrite NEGATIVE  NEGATIVE   Leukocytes, UA NEGATIVE  NEGATIVE  APTT      Result Value Range   aPTT 52 (*) 24 - 37 seconds  PROTIME-INR      Result Value Range   Prothrombin Time 30.5 (*) 11.6 - 15.2 seconds   INR 3.13 (*) 0.00 - 1.49  TROPONIN I      Result Value Range   Troponin I <0.30  <0.30 ng/mL   Laboratory interpretation all normal except concentrated urine, renal insuffic, INR over therapeutic, leukocytosis    Dg Chest 1 View  11/12/2012  *RADIOLOGY REPORT*  Clinical Data: Weakness.  Altered mental status.  CHEST - 1 VIEW  Comparison: Chest x-ray 10/15/2012.  Findings: Low lung volumes.  No consolidative air space disease. No pleural effusions.  Partially calcified pulmonary nodule in the right mid lung is unchanged compared to numerous prior examinations, likely represents a granuloma.  Calcifications in the right perihilar region likely represents a calcified right hilar lymph nodes.  No evidence of pulmonary edema.  Heart size is mildly enlarged. The patient is rotated to the left on today's exam, resulting in distortion of the mediastinal contours and reduced diagnostic sensitivity and specificity for mediastinal pathology. Atherosclerosis in the thoracic aorta.  IMPRESSION: 1.  Low lung volumes without radiographic evidence of acute cardiopulmonary disease. 2.  Atherosclerosis.   Original Report  Authenticated By: Trudie Reed, M.D.      Dg Chest 1 View  10/15/2012 .  IMPRESSION: Low lung volumes.  No acute findings.   Original Report Authenticated By: Leanna Battles, M.D.    Dg Forearm Right  10/15/2012  .  IMPRESSION: No acute osseous abnormality.   Original Report Authenticated By: Leanna Battles, M.D.    Dg Ankle Complete Left  10/15/2012  .  IMPRESSION: Healed medial and lateral malleolar fractures without acute osseous abnormality.   Original Report Authenticated By: Leanna Battles, M.D.    Ct Head Wo Contrast  10/15/2012 .  IMPRESSION: No acute or traumatic finding.  Atrophy and chronic small vessel disease.  Chronic volume loss in the right parietal region related to chronic vascular malformation in that is calcified and/or embolized.   Original Report Authenticated By: Paulina Fusi, M.D.      Date: 11/12/2012  Rate: 97  Rhythm: atrial fibrillation and premature ventricular contractions (PVC)  QRS Axis: normal  Intervals: normal  ST/T Wave abnormalities: nonspecific ST/T changes  Conduction Disutrbances:right bundle branch block  Narrative Interpretation:   Old EKG Reviewed: changed from 10/15/2012, now has PVC's or abberant beats    1. Weakness   2. Dehydration   3. Warfarin-induced coagulopathy     Plan discharge  Devoria Albe, MD, FACEP   MDM   I personally performed the services described in this documentation, which was scribed in my presence. The recorded information has been reviewed and considered.  Devoria Albe, MD, Armando Gang       Ward Givens, MD 11/12/12 2018

## 2012-11-12 NOTE — ED Notes (Signed)
Pt family refused Head CT. Rational and risk of not doing procedure explained to pt son.

## 2012-11-12 NOTE — ED Notes (Signed)
Pt ambulated out in the hall with one assist (minimal assistance). Pt is drinking ice water at this time.

## 2012-11-12 NOTE — ED Notes (Signed)
Son says has not been drinking  Flds, well, would not take her medications.. And  Weak

## 2012-11-13 ENCOUNTER — Ambulatory Visit (INDEPENDENT_AMBULATORY_CARE_PROVIDER_SITE_OTHER): Payer: PRIVATE HEALTH INSURANCE | Admitting: *Deleted

## 2012-11-13 ENCOUNTER — Telehealth: Payer: Self-pay | Admitting: *Deleted

## 2012-11-13 DIAGNOSIS — Z7901 Long term (current) use of anticoagulants: Secondary | ICD-10-CM

## 2012-11-13 LAB — URINE CULTURE: Culture: NO GROWTH

## 2012-11-13 NOTE — Telephone Encounter (Signed)
Pt was seen in ed yesterday and inr was 3.13, they need to know how to adjust.

## 2012-11-13 NOTE — Telephone Encounter (Signed)
See coumadin note. 

## 2012-12-03 ENCOUNTER — Ambulatory Visit (INDEPENDENT_AMBULATORY_CARE_PROVIDER_SITE_OTHER): Payer: PRIVATE HEALTH INSURANCE | Admitting: *Deleted

## 2012-12-03 DIAGNOSIS — I4891 Unspecified atrial fibrillation: Secondary | ICD-10-CM

## 2012-12-03 DIAGNOSIS — Z7901 Long term (current) use of anticoagulants: Secondary | ICD-10-CM

## 2012-12-24 ENCOUNTER — Ambulatory Visit (INDEPENDENT_AMBULATORY_CARE_PROVIDER_SITE_OTHER): Payer: PRIVATE HEALTH INSURANCE | Admitting: *Deleted

## 2012-12-24 DIAGNOSIS — Z7901 Long term (current) use of anticoagulants: Secondary | ICD-10-CM

## 2012-12-24 DIAGNOSIS — I4891 Unspecified atrial fibrillation: Secondary | ICD-10-CM

## 2012-12-31 ENCOUNTER — Other Ambulatory Visit: Payer: Self-pay | Admitting: Cardiology

## 2013-01-21 ENCOUNTER — Ambulatory Visit (INDEPENDENT_AMBULATORY_CARE_PROVIDER_SITE_OTHER): Payer: PRIVATE HEALTH INSURANCE | Admitting: *Deleted

## 2013-01-21 ENCOUNTER — Encounter: Payer: Self-pay | Admitting: *Deleted

## 2013-01-21 DIAGNOSIS — Z7901 Long term (current) use of anticoagulants: Secondary | ICD-10-CM

## 2013-01-21 DIAGNOSIS — I4891 Unspecified atrial fibrillation: Secondary | ICD-10-CM

## 2013-02-06 ENCOUNTER — Encounter (HOSPITAL_COMMUNITY): Payer: Self-pay | Admitting: *Deleted

## 2013-02-06 ENCOUNTER — Inpatient Hospital Stay (HOSPITAL_COMMUNITY)
Admission: EM | Admit: 2013-02-06 | Discharge: 2013-02-09 | DRG: 194 | Disposition: A | Discharge status: Home Health | Payer: PRIVATE HEALTH INSURANCE | Attending: Internal Medicine | Admitting: Internal Medicine

## 2013-02-06 ENCOUNTER — Emergency Department (HOSPITAL_COMMUNITY): Payer: PRIVATE HEALTH INSURANCE

## 2013-02-06 DIAGNOSIS — Z970 Presence of artificial eye: Secondary | ICD-10-CM

## 2013-02-06 DIAGNOSIS — M4000 Postural kyphosis, site unspecified: Secondary | ICD-10-CM

## 2013-02-06 DIAGNOSIS — F0390 Unspecified dementia without behavioral disturbance: Secondary | ICD-10-CM | POA: Diagnosis present

## 2013-02-06 DIAGNOSIS — I10 Essential (primary) hypertension: Secondary | ICD-10-CM

## 2013-02-06 DIAGNOSIS — R4182 Altered mental status, unspecified: Secondary | ICD-10-CM

## 2013-02-06 DIAGNOSIS — Z881 Allergy status to other antibiotic agents status: Secondary | ICD-10-CM

## 2013-02-06 DIAGNOSIS — Z7901 Long term (current) use of anticoagulants: Secondary | ICD-10-CM

## 2013-02-06 DIAGNOSIS — E860 Dehydration: Secondary | ICD-10-CM | POA: Diagnosis present

## 2013-02-06 DIAGNOSIS — N182 Chronic kidney disease, stage 2 (mild): Secondary | ICD-10-CM | POA: Diagnosis present

## 2013-02-06 DIAGNOSIS — I509 Heart failure, unspecified: Secondary | ICD-10-CM | POA: Diagnosis present

## 2013-02-06 DIAGNOSIS — I4891 Unspecified atrial fibrillation: Secondary | ICD-10-CM | POA: Diagnosis present

## 2013-02-06 DIAGNOSIS — D72829 Elevated white blood cell count, unspecified: Secondary | ICD-10-CM | POA: Diagnosis present

## 2013-02-06 DIAGNOSIS — Z882 Allergy status to sulfonamides status: Secondary | ICD-10-CM

## 2013-02-06 DIAGNOSIS — I480 Paroxysmal atrial fibrillation: Secondary | ICD-10-CM

## 2013-02-06 DIAGNOSIS — F29 Unspecified psychosis not due to a substance or known physiological condition: Secondary | ICD-10-CM

## 2013-02-06 DIAGNOSIS — I2510 Atherosclerotic heart disease of native coronary artery without angina pectoris: Secondary | ICD-10-CM | POA: Diagnosis present

## 2013-02-06 DIAGNOSIS — R410 Disorientation, unspecified: Secondary | ICD-10-CM

## 2013-02-06 DIAGNOSIS — Z79899 Other long term (current) drug therapy: Secondary | ICD-10-CM

## 2013-02-06 DIAGNOSIS — F068 Other specified mental disorders due to known physiological condition: Secondary | ICD-10-CM

## 2013-02-06 DIAGNOSIS — J189 Pneumonia, unspecified organism: Principal | ICD-10-CM | POA: Diagnosis present

## 2013-02-06 DIAGNOSIS — I679 Cerebrovascular disease, unspecified: Secondary | ICD-10-CM

## 2013-02-06 DIAGNOSIS — E785 Hyperlipidemia, unspecified: Secondary | ICD-10-CM

## 2013-02-06 DIAGNOSIS — I129 Hypertensive chronic kidney disease with stage 1 through stage 4 chronic kidney disease, or unspecified chronic kidney disease: Secondary | ICD-10-CM | POA: Diagnosis present

## 2013-02-06 DIAGNOSIS — I252 Old myocardial infarction: Secondary | ICD-10-CM

## 2013-02-06 DIAGNOSIS — R627 Adult failure to thrive: Secondary | ICD-10-CM | POA: Diagnosis present

## 2013-02-06 DIAGNOSIS — N179 Acute kidney failure, unspecified: Secondary | ICD-10-CM | POA: Diagnosis present

## 2013-02-06 DIAGNOSIS — F17201 Nicotine dependence, unspecified, in remission: Secondary | ICD-10-CM

## 2013-02-06 DIAGNOSIS — Z87891 Personal history of nicotine dependence: Secondary | ICD-10-CM

## 2013-02-06 DIAGNOSIS — N189 Chronic kidney disease, unspecified: Secondary | ICD-10-CM

## 2013-02-06 LAB — BLOOD GAS, ARTERIAL
Acid-Base Excess: 4.6 mmol/L — ABNORMAL HIGH (ref 0.0–2.0)
O2 Content: 2 L/min
O2 Saturation: 98.6 %
Patient temperature: 37
pO2, Arterial: 110 mmHg — ABNORMAL HIGH (ref 80.0–100.0)

## 2013-02-06 LAB — COMPREHENSIVE METABOLIC PANEL
Albumin: 3.1 g/dL — ABNORMAL LOW (ref 3.5–5.2)
BUN: 20 mg/dL (ref 6–23)
Creatinine, Ser: 1.32 mg/dL — ABNORMAL HIGH (ref 0.50–1.10)
Total Bilirubin: 0.7 mg/dL (ref 0.3–1.2)
Total Protein: 6.6 g/dL (ref 6.0–8.3)

## 2013-02-06 LAB — URINALYSIS, ROUTINE W REFLEX MICROSCOPIC
Glucose, UA: NEGATIVE mg/dL
Ketones, ur: NEGATIVE mg/dL
Leukocytes, UA: NEGATIVE
pH: 6 (ref 5.0–8.0)

## 2013-02-06 LAB — CBC WITH DIFFERENTIAL/PLATELET
Basophils Relative: 0 % (ref 0–1)
Eosinophils Absolute: 0.1 10*3/uL (ref 0.0–0.7)
Eosinophils Relative: 0 % (ref 0–5)
HCT: 41 % (ref 36.0–46.0)
Hemoglobin: 13.7 g/dL (ref 12.0–15.0)
MCH: 30.2 pg (ref 26.0–34.0)
MCHC: 33.4 g/dL (ref 30.0–36.0)
Monocytes Absolute: 2 10*3/uL — ABNORMAL HIGH (ref 0.1–1.0)
Monocytes Relative: 8 % (ref 3–12)

## 2013-02-06 LAB — APTT: aPTT: 58 seconds — ABNORMAL HIGH (ref 24–37)

## 2013-02-06 LAB — URINE MICROSCOPIC-ADD ON

## 2013-02-06 LAB — LACTIC ACID, PLASMA: Lactic Acid, Venous: 1.1 mmol/L (ref 0.5–2.2)

## 2013-02-06 MED ORDER — SODIUM CHLORIDE 0.9 % IJ SOLN
3.0000 mL | Freq: Two times a day (BID) | INTRAMUSCULAR | Status: DC
  Administered 2013-02-06 – 2013-02-09 (×6): 3 mL via INTRAVENOUS
  Filled 2013-02-06: qty 3

## 2013-02-06 MED ORDER — IPRATROPIUM-ALBUTEROL 20-100 MCG/ACT IN AERS: 1.0000 | INHALATION_SPRAY | Freq: Four times a day (QID) | RESPIRATORY_TRACT | Status: DC | PRN

## 2013-02-06 MED ORDER — ONDANSETRON HCL 4 MG PO TABS: 4.0000 mg | ORAL_TABLET | Freq: Four times a day (QID) | ORAL | Status: DC | PRN

## 2013-02-06 MED ORDER — ACETAMINOPHEN 325 MG PO TABS: 650.0000 mg | ORAL_TABLET | Freq: Four times a day (QID) | ORAL | Status: DC | PRN

## 2013-02-06 MED ORDER — ALBUTEROL SULFATE (5 MG/ML) 0.5% IN NEBU
2.5000 mg | INHALATION_SOLUTION | RESPIRATORY_TRACT | Status: DC | PRN
  Administered 2013-02-06 – 2013-02-09 (×2): 2.5 mg via RESPIRATORY_TRACT
  Filled 2013-02-06 (×2): qty 0.5

## 2013-02-06 MED ORDER — SODIUM CHLORIDE 0.9 % IV SOLN
INTRAVENOUS | Status: DC
  Administered 2013-02-06: 11:00:00 via INTRAVENOUS

## 2013-02-06 MED ORDER — SODIUM CHLORIDE 0.9 % IV SOLN
INTRAVENOUS | Status: DC
  Administered 2013-02-06: 14:00:00 via INTRAVENOUS

## 2013-02-06 MED ORDER — ACETAMINOPHEN 650 MG RE SUPP: 650.0000 mg | Freq: Four times a day (QID) | RECTAL | Status: DC | PRN

## 2013-02-06 MED ORDER — GUAIFENESIN-DM 100-10 MG/5ML PO SYRP: 5.0000 mL | ORAL_SOLUTION | ORAL | Status: DC | PRN

## 2013-02-06 MED ORDER — HEPARIN SODIUM (PORCINE) 5000 UNIT/ML IJ SOLN
5000.0000 [IU] | Freq: Three times a day (TID) | INTRAMUSCULAR | Status: DC
  Administered 2013-02-06 – 2013-02-07 (×3): 5000 [IU] via SUBCUTANEOUS
  Filled 2013-02-06 (×3): qty 1

## 2013-02-06 MED ORDER — DEXTROSE 5 % IV SOLN
500.0000 mg | INTRAVENOUS | Status: DC
  Administered 2013-02-06 – 2013-02-08 (×3): 500 mg via INTRAVENOUS
  Filled 2013-02-06 (×5): qty 500

## 2013-02-06 MED ORDER — WARFARIN - PHARMACIST DOSING INPATIENT: Status: DC

## 2013-02-06 MED ORDER — ONDANSETRON HCL 4 MG/2ML IJ SOLN: 4.0000 mg | Freq: Four times a day (QID) | INTRAMUSCULAR | Status: DC | PRN

## 2013-02-06 MED ORDER — LORAZEPAM 0.5 MG PO TABS
0.5000 mg | ORAL_TABLET | Freq: Three times a day (TID) | ORAL | Status: DC
  Administered 2013-02-06 – 2013-02-09 (×8): 0.5 mg via ORAL
  Filled 2013-02-06 (×8): qty 1

## 2013-02-06 MED ORDER — IPRATROPIUM-ALBUTEROL 18-103 MCG/ACT IN AERO
2.0000 | INHALATION_SPRAY | Freq: Four times a day (QID) | RESPIRATORY_TRACT | Status: DC | PRN
  Filled 2013-02-06: qty 14.7

## 2013-02-06 MED ORDER — WARFARIN SODIUM 2.5 MG PO TABS
2.5000 mg | ORAL_TABLET | ORAL | Status: DC
  Administered 2013-02-06: 2.5 mg via ORAL
  Filled 2013-02-06: qty 1

## 2013-02-06 MED ORDER — ALBUTEROL SULFATE (5 MG/ML) 0.5% IN NEBU
2.5000 mg | INHALATION_SOLUTION | Freq: Three times a day (TID) | RESPIRATORY_TRACT | Status: DC
  Administered 2013-02-07 – 2013-02-08 (×6): 2.5 mg via RESPIRATORY_TRACT
  Filled 2013-02-06 (×7): qty 0.5

## 2013-02-06 MED ORDER — DEXTROSE 5 % IV SOLN
1.0000 g | INTRAVENOUS | Status: DC
  Administered 2013-02-06 – 2013-02-08 (×3): 1 g via INTRAVENOUS
  Filled 2013-02-06 (×5): qty 10

## 2013-02-06 MED ORDER — SODIUM CHLORIDE 0.9 % IV SOLN
INTRAVENOUS | Status: DC
  Administered 2013-02-06 – 2013-02-07 (×2): via INTRAVENOUS

## 2013-02-06 NOTE — Progress Notes (Signed)
Pt has history of dementia. Went over pt safety fall prevention plan with son. Will continue to monitor, use bed alarm, and utilize hourly rounding.

## 2013-02-06 NOTE — ED Provider Notes (Signed)
History    This chart was scribed for Shelda Jakes, MD by Leone Payor, ED Scribe. This patient was seen in room [facility]06/[facility]06 and the patient's care was started 10:19 AM.   CSN: [id number]  Arrival date & time 02/06/13  0903   None     Chief Complaint  Patient presents with  . Weakness  . Aphasia     The history is provided by a relative. The history is limited by the condition of the patient. No language interpreter was used.   Level 5 Caveat-Confusion/Dementia HPI Comments: Angela [name] is a 79 y.o. female who presents to the Emergency Department complaining of difficulty walking, slurred speech, decreased appetite, increased weakness and confusion starting 2 days ago. She has had some productive coughing as well. Pt's son was able to give a partial ROS. Family member states she is supposed to use her oxygen at home but she does not use it regularly.  Pt has h/o mild dementia. Pt is a former smoker but denies alcohol use.     PCP Dr. Sherwood Gambler  Past Medical History  Diagnosis Date  . ASCVD (arteriosclerotic cardiovascular disease)     acute MI in 1993;40%left main,80%LAD, 100%RCA-PTCA; EF of 40%;stress nuclear in2007; normal EF;  mixed inferolateral ischemia and infarction; CHF with normal EF in 2011  . CHF (congestive heart failure)     normal EF;mixed inferolateral ischemic and infarction;chf with normal EF in 2011  . Hypertension   . Hyperlipidemia   . Cerebrovascular disease     CVA by CT in 2007,but not noted in 2009;duplex in 12/2006 plaque without stenosis; Emergency Room evaluation in 12/2010 for transient aphasia and paresis-clopidogrel initially added to ASA Rx, but subsequently warfarin was substituted for these 2 agents  . Tobacco abuse, in remission     quit in 1995  . Claudication   . Dementia     mild  . Kyphosis     severe  . Decreased hearing   . Atrial fibrillation   . Chronic anticoagulation   . Chronic kidney disease   . TIA (transient ischemic attack)      Past Surgical History  Procedure Laterality Date  . Abdominal hysterectomy    . Appendectomy    . Tonsillectomy    . Enucleation      Right-resulted from infection    Family History  Problem Relation Age of Onset  . Dementia Mother   . Atrial fibrillation Son   . Hypertension Son     History  Substance Use Topics  . Smoking status: Former Smoker    Quit date: 01/05/1994  . Smokeless tobacco: Never Used  . Alcohol Use: No    OB History   Grav Para Term Preterm Abortions TAB SAB Ect Mult Living                  Review of Systems  Unable to perform ROS: Dementia  Constitutional: Positive for appetite change.  Respiratory: Positive for cough.   Psychiatric/Behavioral: Positive for confusion.    Allergies  Haldol; Sulfa antibiotics; and Sulfonamide derivatives  Home Medications   Current Outpatient Rx  Name  Route  Sig  Dispense  Refill  . albuterol (PROVENTIL) (2.5 MG/3ML) 0.083% nebulizer solution   Nebulization   Take 2.5 mg by nebulization every 4 (four) hours as needed. For shortness of breath/cough         . albuterol-ipratropium (COMBIVENT) 18-103 MCG/ACT inhaler   Inhalation   Inhale 2 puffs into  the lungs every 6 (six) hours as needed. For shortness of breath         . alendronate (FOSAMAX) 70 MG tablet   Oral   Take 70 mg by mouth every 7 (seven) days. Take with a full glass of water on an empty stomach.          . cetirizine (ZYRTEC) 10 MG tablet   Oral   Take 10 mg by mouth at bedtime.          . Cholecalciferol (VITAMIN D) 1000 UNITS capsule   Oral   Take 2,000 Units by mouth every morning.          . clobetasol cream (TEMOVATE) 0.05 %   Topical   Apply 1 application topically once a week.         . Dietary Management Product (AXONA) packet   Oral   Take 40 g by mouth daily. 1/2 packet  Given daily in the morning         . donepezil (ARICEPT) 10 MG tablet   Oral   Take 10 mg by mouth every morning.         .  furosemide (LASIX) 40 MG tablet   Oral   Take 40 mg by mouth daily. *Given on an empty stomach 30 minutes before eating         . lisinopril (PRINIVIL,ZESTRIL) 2.5 MG tablet   Oral   Take 2.5 mg by mouth every morning.          Marland Kitchen LORazepam (ATIVAN) 0.5 MG tablet   Oral   Take 0.5 mg by mouth 3 (three) times daily. Anxiety and or agitation. *Last dose is not to be given, if patient receives sleeping medication HALCION (TRIAZOLAM)*         . memantine (NAMENDA) 10 MG tablet   Oral   Take 10 mg by mouth 2 (two) times daily.          . Multiple Vitamins-Minerals (MULTIVITAMIN WITH MINERALS) tablet   Oral   Take 1 tablet by mouth every morning.          . nitroGLYCERIN (NITROSTAT) 0.4 MG SL tablet   Sublingual   Place 0.4 mg under the tongue every 5 (five) minutes as needed. For chest pain          . Omega-3 Fatty Acids (FISH OIL) 1000 MG CAPS   Oral   Take 1,200 mg by mouth 2 (two) times daily.          . polyethylene glycol (MIRALAX / GLYCOLAX) packet   Oral   Take 17 g by mouth daily as needed. constip         . potassium chloride SA (K-DUR,KLOR-CON) 20 MEQ tablet   Oral   Take 20 mEq by mouth every morning.          . pravastatin (PRAVACHOL) 40 MG tablet   Oral   Take 1 tablet (40 mg total) by mouth at bedtime.   30 tablet   10   . Salicylic Acid (SALEX) 6 % SHAM   Apply externally   Apply 1 application topically once a week. For psoriasis         . triazolam (HALCION) 0.125 MG tablet   Oral   Take 0.125 mg by mouth at bedtime as needed (*IF THIS MEDICATION IS GIVEN, PATIENT DOES NOT TAKE NIGHT DOSE OF ATIVAN (LORAZEPAM)*). For sleep         . vitamin C (ASCORBIC ACID)  500 MG tablet   Oral   Take 500 mg by mouth every morning.          . warfarin (COUMADIN) 2.5 MG tablet   Oral   Take 2.5 mg by mouth every evening.            BP 97/54  Pulse 65  Temp(Src) 98.7 F (37.1 C) (Oral)  Resp 22  SpO2 99%  Physical Exam  Nursing  note and vitals reviewed. Constitutional: She is oriented to person, place, and time. She appears well-developed and well-nourished. No distress.  HENT:  Head: Normocephalic and atraumatic.  Eyes: EOM are normal.  Artifical eye in R side.  Neck: Neck supple. No tracheal deviation present.  Cardiovascular: Normal rate and normal heart sounds.  An irregular rhythm present.  No murmur heard. Irregular.   Pulmonary/Chest: Effort normal. No respiratory distress. She has decreased breath sounds. She has no wheezes. She has no rales. She exhibits no tenderness.     Abdominal: Soft. Bowel sounds are normal. There is no tenderness.  Musculoskeletal: Normal range of motion. She exhibits no edema.  Lymphadenopathy:    She has no cervical adenopathy.  Neurological: She is alert and oriented to person, place, and time. No cranial nerve deficit.  Skin: Skin is warm and dry.  Psychiatric: She has a normal mood and affect. Her behavior is normal.    ED Course  Procedures (including critical care time)  DIAGNOSTIC STUDIES: Oxygen Saturation is 94% on room air, adequate by my interpretation.    COORDINATION OF CARE: 10:41 AM Discussed treatment plan with pt at bedside and pt agreed to plan.   Labs Reviewed  CBC WITH DIFFERENTIAL - Abnormal; Notable for the following:    WBC 26.5 (*)    Neutrophils Relative 85 (*)    Neutro Abs 22.6 (*)    Lymphocytes Relative 7 (*)    Monocytes Absolute 2.0 (*)    All other components within normal limits  COMPREHENSIVE METABOLIC PANEL - Abnormal; Notable for the following:    Glucose, Bld 106 (*)    Creatinine, Ser 1.32 (*)    Albumin 3.1 (*)    GFR calc non Af Amer 35 (*)    GFR calc Af Amer 40 (*)    All other components within normal limits  URINALYSIS, ROUTINE W REFLEX MICROSCOPIC - Abnormal; Notable for the following:    Protein, ur TRACE (*)    All other components within normal limits  APTT - Abnormal; Notable for the following:    aPTT 58 (*)     All other components within normal limits  PROTIME-INR - Abnormal; Notable for the following:    Prothrombin Time 27.8 (*)    INR 2.76 (*)    All other components within normal limits  URINE MICROSCOPIC-ADD ON - Abnormal; Notable for the following:    Squamous Epithelial / LPF FEW (*)    All other components within normal limits  CULTURE, BLOOD (ROUTINE X 2)  CULTURE, BLOOD (ROUTINE X 2)  LACTIC ACID, PLASMA  BLOOD GAS, ARTERIAL   Dg Chest 1 View  02/06/2013  *RADIOLOGY REPORT*  Clinical Data: Weakness, aphasia  CHEST - 1 VIEW  Comparison: 11/12/2012; 10/15/2012; 01/29/2012  Findings:  Grossly unchanged cardiac silhouette and mediastinal contours given decreased lung volumes and marked kyphotic projection.  Grossly unchanged perihilar heterogeneous opacities.  Possible minimal worsening of left basilar heterogeneous opacities.  No definite pleural effusion or pneumothorax.  Grossly unchanged bones.  IMPRESSION: Degraded examination  with possible worsening of left basilar opacities, atelectasis versus infiltrate. Further evaluation with a PA and lateral chest radiograph may be obtained as clinically indicated.   Original Report Authenticated By: Tacey Ruiz, MD    Dg Chest 2 View  02/06/2013  *RADIOLOGY REPORT*  Clinical Data: Weakness, aphasia  CHEST - 2 VIEW  Comparison: 02/06/2013 07/01 10/15/2012; 04/28 02/20/2012  Findings: Grossly unchanged cardiac silhouette and mediastinal contours given persistent kyphotic projection.  Atherosclerotic calcifications within a slightly tortuous thoracic aorta.  Improved aeration of the bilateral lung bases.  No new focal airspace opacities.  No definite pleural effusion or pneumothorax.  No definite evidence of edema.  Grossly unchanged bones including marked kyphosis of the thoracic spine.  IMPRESSION:  1.  No acute cardiopulmonary disease.  2.  Improved aeration of the bilateral lung bases suggests resolved atelectasis.   Original Report Authenticated By:  Tacey Ruiz, MD    Ct Head Wo Contrast  02/06/2013  *RADIOLOGY REPORT*  Clinical Data: Weakness and aphasia.  CT HEAD WITHOUT CONTRAST  Technique:  Contiguous axial images were obtained from the base of the skull through the vertex without contrast.  Comparison: 10/15/2012.  Findings: Motion degraded exam.  No intracranial hemorrhage.  Prominent small vessel disease type changes and remote infarcts. No CT evidence of large acute infarct.  Difficult to exclude small to moderate sized infarct given the degree white matter type changes.  Calcification right hemisphere consistent with vascular malformation which is without significant change.  No other intracranial mass lesion noted on this unenhanced exam.  Post right orbital exoneration.  Global atrophy without hydrocephalus.  IMPRESSION: No intracranial hemorrhage  Prominent small vessel disease type changes and remote infarcts. No CT evidence of large acute infarct.  Difficult to exclude small to moderate sized infarct given the degree white matter type changes.  Global atrophy.  Similar appearance of calcified arteriovenous malformation.   Original Report Authenticated By: Lacy Duverney, M.D.     Results for orders placed during the hospital encounter of 02/06/13  CULTURE, BLOOD (ROUTINE X 2)      Result Value Range   Specimen Description Blood     Special Requests Normal     Culture NO GROWTH <24 HRS     Report Status PENDING    CULTURE, BLOOD (ROUTINE X 2)      Result Value Range   Specimen Description Blood     Special Requests Normal     Culture NO GROWTH <24 HRS     Report Status PENDING    CBC WITH DIFFERENTIAL      Result Value Range   WBC 26.5 (*) 4.0 - 10.5 K/uL   RBC 4.54  3.87 - 5.11 MIL/uL   Hemoglobin 13.7  12.0 - 15.0 g/dL   HCT 16.1  09.6 - 04.5 %   MCV 90.3  78.0 - 100.0 fL   MCH 30.2  26.0 - 34.0 pg   MCHC 33.4  30.0 - 36.0 g/dL   RDW 40.9  81.1 - 91.4 %   Platelets 231  150 - 400 K/uL   Neutrophils Relative 85 (*) 43 -  77 %   Neutro Abs 22.6 (*) 1.7 - 7.7 K/uL   Lymphocytes Relative 7 (*) 12 - 46 %   Lymphs Abs 1.8  0.7 - 4.0 K/uL   Monocytes Relative 8  3 - 12 %   Monocytes Absolute 2.0 (*) 0.1 - 1.0 K/uL   Eosinophils Relative 0  0 - 5 %  Eosinophils Absolute 0.1  0.0 - 0.7 K/uL   Basophils Relative 0  0 - 1 %   Basophils Absolute 0.1  0.0 - 0.1 K/uL  COMPREHENSIVE METABOLIC PANEL      Result Value Range   Sodium 136  135 - 145 mEq/L   Potassium 4.5  3.5 - 5.1 mEq/L   Chloride 96  96 - 112 mEq/L   CO2 29  19 - 32 mEq/L   Glucose, Bld 106 (*) 70 - 99 mg/dL   BUN 20  6 - 23 mg/dL   Creatinine, Ser 1.61 (*) 0.50 - 1.10 mg/dL   Calcium 9.2  8.4 - 09.6 mg/dL   Total Protein 6.6  6.0 - 8.3 g/dL   Albumin 3.1 (*) 3.5 - 5.2 g/dL   AST 19  0 - 37 U/L   ALT 10  0 - 35 U/L   Alkaline Phosphatase 95  39 - 117 U/L   Total Bilirubin 0.7  0.3 - 1.2 mg/dL   GFR calc non Af Amer 35 (*) >90 mL/min   GFR calc Af Amer 40 (*) >90 mL/min  URINALYSIS, ROUTINE W REFLEX MICROSCOPIC      Result Value Range   Color, Urine YELLOW  YELLOW   APPearance CLEAR  CLEAR   Specific Gravity, Urine 1.020  1.005 - 1.030   pH 6.0  5.0 - 8.0   Glucose, UA NEGATIVE  NEGATIVE mg/dL   Hgb urine dipstick NEGATIVE  NEGATIVE   Bilirubin Urine NEGATIVE  NEGATIVE   Ketones, ur NEGATIVE  NEGATIVE mg/dL   Protein, ur TRACE (*) NEGATIVE mg/dL   Urobilinogen, UA 0.2  0.0 - 1.0 mg/dL   Nitrite NEGATIVE  NEGATIVE   Leukocytes, UA NEGATIVE  NEGATIVE  APTT      Result Value Range   aPTT 58 (*) 24 - 37 seconds  PROTIME-INR      Result Value Range   Prothrombin Time 27.8 (*) 11.6 - 15.2 seconds   INR 2.76 (*) 0.00 - 1.49  URINE MICROSCOPIC-ADD ON      Result Value Range   Squamous Epithelial / LPF FEW (*) RARE   WBC, UA 0-2  <3 WBC/hpf   RBC / HPF 0-2  <3 RBC/hpf  LACTIC ACID, PLASMA      Result Value Range   Lactic Acid, Venous 1.1  0.5 - 2.2 mmol/L    Date: 02/06/2013  Rate: 95  Rhythm: atrial fibrillation and premature  ventricular contractions (PVC)  QRS Axis: normal  Intervals: normal  ST/T Wave abnormalities: nonspecific ST/T changes  Conduction Disutrbances:right bundle branch block  Narrative Interpretation:   Old EKG Reviewed: unchanged EKG without significant changes compared to 11/12/2012     1. Confusion   2. Leukocytosis       MDM   Persistent leukocytosis also present in February. Wonder if there may not be a primary blood disorder. For the confusion and weakness loss of appetite not wanting to take by mouth fluids patient will be admitted no evidence of infection in the urine chest x-rays negative for pneumonia head CT is negative. Lactic acid is not consistent with sepsis. Patient therefore not started on antibiotics blood cultures were done and pending. Patient's INR is therapeutic. Discussed with hospitalist they will admit.     I personally performed the services described in this documentation, which was scribed in my presence. The recorded information has been reviewed and is accurate.           Bing Neighbors.  Deretha Emory, MD 02/06/13 1318

## 2013-02-06 NOTE — Progress Notes (Signed)
ANTICOAGULATION CONSULT NOTE - Initial Consult  Pharmacy Consult for Coumadin (chronic PTA) Indication: atrial fibrillation  Allergies  Allergen Reactions  . Haldol (Haloperidol Decanoate)     Over sedation  . Sulfa Antibiotics Other (See Comments)    uknown  . Sulfonamide Derivatives Other (See Comments)    unknown   Patient Measurements: Height: 5\' 3"  (160 cm) Weight: 118 lb 4.8 oz (53.661 kg) IBW/kg (Calculated) : 52.4  Vital Signs: Temp: 98.5 F (36.9 C) (05/07 1433) Temp src: Oral (05/07 1433) BP: 101/58 mmHg (05/07 1433) Pulse Rate: 69 (05/07 1433)  Labs:  Recent Labs  02/06/13 0927  HGB 13.7  HCT 41.0  PLT 231  APTT 58*  LABPROT 27.8*  INR 2.76*  CREATININE 1.32*    Estimated Creatinine Clearance: 23.9 ml/min (by C-G formula based on Cr of 1.32).  Medical History: Past Medical History  Diagnosis Date  . ASCVD (arteriosclerotic cardiovascular disease)     acute MI in 1993;40%left main,80%LAD, 100%RCA-PTCA; EF of 40%;stress nuclear in2007; normal EF;  mixed inferolateral ischemia and infarction; CHF with normal EF in 2011  . CHF (congestive heart failure)     normal EF;mixed inferolateral ischemic and infarction;chf with normal EF in 2011  . Hypertension   . Hyperlipidemia   . Cerebrovascular disease     CVA by CT in 2007,but not noted in 2009;duplex in 12/2006 plaque without stenosis; Emergency Room evaluation in 12/2010 for transient aphasia and paresis-clopidogrel initially added to ASA Rx, but subsequently warfarin was substituted for these 2 agents  . Tobacco abuse, in remission     quit in 1995  . Claudication   . Dementia     mild  . Kyphosis     severe  . Decreased hearing   . Atrial fibrillation   . Chronic anticoagulation   . Chronic kidney disease   . TIA (transient ischemic attack)    Medications:  Prescriptions prior to admission  Medication Sig Dispense Refill  . albuterol (PROVENTIL) (2.5 MG/3ML) 0.083% nebulizer solution Take 2.5  mg by nebulization every 4 (four) hours as needed. For shortness of breath/cough      . albuterol-ipratropium (COMBIVENT) 18-103 MCG/ACT inhaler Inhale 2 puffs into the lungs every 6 (six) hours as needed. For shortness of breath      . alendronate (FOSAMAX) 70 MG tablet Take 70 mg by mouth every 7 (seven) days. Take with a full glass of water on an empty stomach.       . cetirizine (ZYRTEC) 10 MG tablet Take 10 mg by mouth at bedtime.       . Cholecalciferol (VITAMIN D) 1000 UNITS capsule Take 2,000 Units by mouth every morning.       . clobetasol cream (TEMOVATE) 0.05 % Apply 1 application topically once a week.      . Dietary Management Product (AXONA) packet Take 40 g by mouth daily. 1/2 packet  Given daily in the morning      . donepezil (ARICEPT) 10 MG tablet Take 10 mg by mouth every morning.      . furosemide (LASIX) 40 MG tablet Take 40 mg by mouth daily. *Given on an empty stomach 30 minutes before eating      . lisinopril (PRINIVIL,ZESTRIL) 2.5 MG tablet Take 2.5 mg by mouth every morning.       Marland Kitchen LORazepam (ATIVAN) 0.5 MG tablet Take 0.5 mg by mouth 3 (three) times daily. Anxiety and or agitation. *Last dose is not to be given, if patient receives  sleeping medication HALCION (TRIAZOLAM)*      . memantine (NAMENDA) 10 MG tablet Take 10 mg by mouth 2 (two) times daily.       . Multiple Vitamins-Minerals (MULTIVITAMIN WITH MINERALS) tablet Take 1 tablet by mouth every morning.       . nitroGLYCERIN (NITROSTAT) 0.4 MG SL tablet Place 0.4 mg under the tongue every 5 (five) minutes as needed. For chest pain       . Omega-3 Fatty Acids (FISH OIL) 1000 MG CAPS Take 1,200 mg by mouth 2 (two) times daily.       . polyethylene glycol (MIRALAX / GLYCOLAX) packet Take 17 g by mouth daily as needed. constip      . potassium chloride SA (K-DUR,KLOR-CON) 20 MEQ tablet Take 20 mEq by mouth every morning.       . pravastatin (PRAVACHOL) 40 MG tablet Take 1 tablet (40 mg total) by mouth at bedtime.  30  tablet  10  . Salicylic Acid (SALEX) 6 % SHAM Apply 1 application topically once a week. For psoriasis      . triazolam (HALCION) 0.125 MG tablet Take 0.125 mg by mouth at bedtime as needed (*IF THIS MEDICATION IS GIVEN, PATIENT DOES NOT TAKE NIGHT DOSE OF ATIVAN (LORAZEPAM)*). For sleep      . vitamin C (ASCORBIC ACID) 500 MG tablet Take 500 mg by mouth every morning.       . warfarin (COUMADIN) 2.5 MG tablet Take 2.5 mg by mouth every evening.         Assessment: 79yo female maintained on chronic Coumadin for afib.  Home dose is listed above (2.5mg  daily).  INR is therapeutic on admission.  Zithromax has been ordered which may interact with warfarin.   Goal of Therapy:  INR 2-3 Monitor platelets by anticoagulation protocol: Yes   Plan:  Coumadin 2.5mg  PO daily INR daily  Angela [name] A 02/06/2013,3:36 PM

## 2013-02-06 NOTE — ED Notes (Signed)
Family member states slurred speech yesterday and last night. States she has also been weaker than usual, difficulty walking with a walker, decreased appetite and fluid intake.

## 2013-02-06 NOTE — H&P (Signed)
[facility] History and Physical  Angela [name]:[id number] DOB: [birth year] DOA: 02/06/2013  Referring physician:  PCP: Cassell Smiles., MD  Specialists:   Chief Complaint: ams  HPI: Angela [name] is a 79 y.o. female with past medical hx that includes CHF, HTN, CVA, afib on anticoagulation, CKD, dementia who presents from home with cc ams. Information obtained from son who lives with patient and provides care. He reports that 3 days ago pt became weak, lethargic, unable to walk and usually able with walker. He reports she stopped eating and drinking. 2 days ago developed a cough with thick sputum and became confused at times. He states pt short term memory bad at baseline but she was having trouble remembering where her room was.  Denies fever, vomiting, diarrhea. States pt did not complain of any kind of pain, or sob. He denies noticing slurred speech or focal weakness in pt.  He stated that this am when condition did not improve he brought to ED. Symptoms came on gradually have persisted and worsened. Symptoms characterized as moderate. Work up in Ed yields white count 26.5, creatine 1.3,  CT of head No intracranial hemorrhage Prominent small vessel disease type changes and remote infarcts. No CT evidence of large acute infarct. Difficult to exclude small to moderate sized infarct given the degree white matter type changes. Chest xray with degraded examination with possible worsening of left basilar opacities, atelectasis versus infiltrate. Pt with temp 100.8, SBP range97-103 and HR 48-86. TRH asked to admit   Review of Systems: as above as reported by son     Past Medical History  Diagnosis Date  . ASCVD (arteriosclerotic cardiovascular disease)     acute MI in 1993;40%left main,80%LAD, 100%RCA-PTCA; EF of 40%;stress nuclear in2007; normal EF;  mixed inferolateral ischemia and infarction; CHF with normal EF in 2011  . CHF (congestive heart failure)     normal EF;mixed inferolateral  ischemic and infarction;chf with normal EF in 2011  . Hypertension   . Hyperlipidemia   . Cerebrovascular disease     CVA by CT in 2007,but not noted in 2009;duplex in 12/2006 plaque without stenosis; Emergency Room evaluation in 12/2010 for transient aphasia and paresis-clopidogrel initially added to ASA Rx, but subsequently warfarin was substituted for these 2 agents  . Tobacco abuse, in remission     quit in 1995  . Claudication   . Dementia     mild  . Kyphosis     severe  . Decreased hearing   . Atrial fibrillation   . Chronic anticoagulation   . Chronic kidney disease   . TIA (transient ischemic attack)    Past Surgical History  Procedure Laterality Date  . Abdominal hysterectomy    . Appendectomy    . Tonsillectomy    . Enucleation      Right-resulted from infection   Social History:  reports that she quit smoking about 19 years ago. She has never used smokeless tobacco. She reports that she does not drink alcohol or use illicit drugs. Lives at home with son and has caregiver in mornings when he works.  Allergies  Allergen Reactions  . Haldol (Haloperidol Decanoate)     Over sedation  . Sulfa Antibiotics Other (See Comments)    uknown  . Sulfonamide Derivatives Other (See Comments)    unknown    Family History  Problem Relation Age of Onset  . Dementia Mother   . Atrial fibrillation Son   . Hypertension Son  Prior to Admission medications   Medication Sig Start Date End Date Taking? Authorizing Provider  albuterol (PROVENTIL) (2.5 MG/3ML) 0.083% nebulizer solution Take 2.5 mg by nebulization every 4 (four) hours as needed. For shortness of breath/cough 08/09/12  Yes Historical Provider, MD  albuterol-ipratropium (COMBIVENT) 18-103 MCG/ACT inhaler Inhale 2 puffs into the lungs every 6 (six) hours as needed. For shortness of breath   Yes Historical Provider, MD  alendronate (FOSAMAX) 70 MG tablet Take 70 mg by mouth every 7 (seven) days. Take with a full glass  of water on an empty stomach.    Yes Historical Provider, MD  cetirizine (ZYRTEC) 10 MG tablet Take 10 mg by mouth at bedtime.    Yes Historical Provider, MD  Cholecalciferol (VITAMIN D) 1000 UNITS capsule Take 2,000 Units by mouth every morning.    Yes Historical Provider, MD  clobetasol cream (TEMOVATE) 0.05 % Apply 1 application topically once a week.   Yes Historical Provider, MD  Dietary Management Product (AXONA) packet Take 40 g by mouth daily. 1/2 packet  Given daily in the morning   Yes Historical Provider, MD  donepezil (ARICEPT) 10 MG tablet Take 10 mg by mouth every morning.   Yes Historical Provider, MD  furosemide (LASIX) 40 MG tablet Take 40 mg by mouth daily. *Given on an empty stomach 30 minutes before eating   Yes Historical Provider, MD  lisinopril (PRINIVIL,ZESTRIL) 2.5 MG tablet Take 2.5 mg by mouth every morning.    Yes Historical Provider, MD  LORazepam (ATIVAN) 0.5 MG tablet Take 0.5 mg by mouth 3 (three) times daily. Anxiety and or agitation. *Last dose is not to be given, if patient receives sleeping medication HALCION (TRIAZOLAM)*   Yes Historical Provider, MD  memantine (NAMENDA) 10 MG tablet Take 10 mg by mouth 2 (two) times daily.    Yes Historical Provider, MD  Multiple Vitamins-Minerals (MULTIVITAMIN WITH MINERALS) tablet Take 1 tablet by mouth every morning.    Yes Historical Provider, MD  nitroGLYCERIN (NITROSTAT) 0.4 MG SL tablet Place 0.4 mg under the tongue every 5 (five) minutes as needed. For chest pain  05/25/11  Yes Jodelle Gross, NP  Omega-3 Fatty Acids (FISH OIL) 1000 MG CAPS Take 1,200 mg by mouth 2 (two) times daily.    Yes Historical Provider, MD  polyethylene glycol (MIRALAX / GLYCOLAX) packet Take 17 g by mouth daily as needed. constip   Yes Historical Provider, MD  potassium chloride SA (K-DUR,KLOR-CON) 20 MEQ tablet Take 20 mEq by mouth every morning.    Yes Historical Provider, MD  pravastatin (PRAVACHOL) 40 MG tablet Take 1 tablet (40 mg total)  by mouth at bedtime. 03/21/12  Yes Jodelle Gross, NP  Salicylic Acid (SALEX) 6 % SHAM Apply 1 application topically once a week. For psoriasis   Yes Historical Provider, MD  triazolam (HALCION) 0.125 MG tablet Take 0.125 mg by mouth at bedtime as needed (*IF THIS MEDICATION IS GIVEN, PATIENT DOES NOT TAKE NIGHT DOSE OF ATIVAN (LORAZEPAM)*). For sleep   Yes Historical Provider, MD  vitamin C (ASCORBIC ACID) 500 MG tablet Take 500 mg by mouth every morning.    Yes Historical Provider, MD  warfarin (COUMADIN) 2.5 MG tablet Take 2.5 mg by mouth every evening.  06/15/12  Yes Kathlen Brunswick, MD   Physical Exam: Filed Vitals:   02/06/13 1100 02/06/13 1153 02/06/13 1243 02/06/13 1433  BP: 102/67 97/54 99/46  101/58  Pulse: 45 65 86 69  Temp:  98.7 F (37.1 C)  98.5 F (36.9 C)  TempSrc:  Oral  Oral  Resp: 25 22 20 22   Height:    5\' 3"  (1.6 m)  Weight:    53.661 kg (118 lb 4.8 oz)  SpO2: 100% 99% 98% 99%     General:  Lethargic but arousable  Eyes: left eye pupil round reactive. Right artificial eye with small amount purulent drainage.   ENT: ears clear nose without drainage mucus membrane mouth pink slightly dry  Neck: no JVD no lymphadenopathy  Cardiovascular: irregularly irregular no MGR No LE edema  Respiratory: normal effort shallow weak cough.BS distant no wheeze no rhonchi  Abdomen: soft +BS non-tender to palpation  Skin: warm dry no rash  Musculoskeletal: no clubbing no cyanosis  Psychiatric: calm   Neurologic: oriented to self/place follow commands as able. Weak.  Labs on Admission:  Basic Metabolic Panel:  Recent Labs Lab 02/06/13 0927  NA 136  K 4.5  CL 96  CO2 29  GLUCOSE 106*  BUN 20  CREATININE 1.32*  CALCIUM 9.2   Liver Function Tests:  Recent Labs Lab 02/06/13 0927  AST 19  ALT 10  ALKPHOS 95  BILITOT 0.7  PROT 6.6  ALBUMIN 3.1*   No results found for this basename: LIPASE, AMYLASE,  in the last 168 hours No results found for this  basename: AMMONIA,  in the last 168 hours CBC:  Recent Labs Lab 02/06/13 0927  WBC 26.5*  NEUTROABS 22.6*  HGB 13.7  HCT 41.0  MCV 90.3  PLT 231   Cardiac Enzymes: No results found for this basename: CKTOTAL, CKMB, CKMBINDEX, TROPONINI,  in the last 168 hours  BNP (last 3 results) No results found for this basename: PROBNP,  in the last 8760 hours CBG: No results found for this basename: GLUCAP,  in the last 168 hours  Radiological Exams on Admission: Dg Chest 1 View  02/06/2013  *RADIOLOGY REPORT*  Clinical Data: Weakness, aphasia  CHEST - 1 VIEW  Comparison: 11/12/2012; 10/15/2012; 01/29/2012  Findings:  Grossly unchanged cardiac silhouette and mediastinal contours given decreased lung volumes and marked kyphotic projection.  Grossly unchanged perihilar heterogeneous opacities.  Possible minimal worsening of left basilar heterogeneous opacities.  No definite pleural effusion or pneumothorax.  Grossly unchanged bones.  IMPRESSION: Degraded examination with possible worsening of left basilar opacities, atelectasis versus infiltrate. Further evaluation with a PA and lateral chest radiograph may be obtained as clinically indicated.   Original Report Authenticated By: Tacey Ruiz, MD    Dg Chest 2 View  02/06/2013  *RADIOLOGY REPORT*  Clinical Data: Weakness, aphasia  CHEST - 2 VIEW  Comparison: 02/06/2013 07/01 10/15/2012; 04/28 02/20/2012  Findings: Grossly unchanged cardiac silhouette and mediastinal contours given persistent kyphotic projection.  Atherosclerotic calcifications within a slightly tortuous thoracic aorta.  Improved aeration of the bilateral lung bases.  No new focal airspace opacities.  No definite pleural effusion or pneumothorax.  No definite evidence of edema.  Grossly unchanged bones including marked kyphosis of the thoracic spine.  IMPRESSION:  1.  No acute cardiopulmonary disease.  2.  Improved aeration of the bilateral lung bases suggests resolved atelectasis.    Original Report Authenticated By: Tacey Ruiz, MD    Ct Head Wo Contrast  02/06/2013  *RADIOLOGY REPORT*  Clinical Data: Weakness and aphasia.  CT HEAD WITHOUT CONTRAST  Technique:  Contiguous axial images were obtained from the base of the skull through the vertex without contrast.  Comparison: 10/15/2012.  Findings: Motion degraded exam.  No  intracranial hemorrhage.  Prominent small vessel disease type changes and remote infarcts. No CT evidence of large acute infarct.  Difficult to exclude small to moderate sized infarct given the degree white matter type changes.  Calcification right hemisphere consistent with vascular malformation which is without significant change.  No other intracranial mass lesion noted on this unenhanced exam.  Post right orbital exoneration.  Global atrophy without hydrocephalus.  IMPRESSION: No intracranial hemorrhage  Prominent small vessel disease type changes and remote infarcts. No CT evidence of large acute infarct.  Difficult to exclude small to moderate sized infarct given the degree white matter type changes.  Global atrophy.  Similar appearance of calcified arteriovenous malformation.   Original Report Authenticated By: Lacy Duverney, M.D.     EKG: Independently reviewed.   Assessment/Plan Principal Problem:   Altered mental status: likely multifactorial i.e infection, dehydration from decreased po intake. Will admit to tele. CT head without acute abnormality. Will start empiric antibiotics and gently hydrate. Reassess in am Active Problems: Leukocytosis, unspecified. Likely from pneumonia. Gently hydrate and repeat chest xray in am. Urine unremarkable. Will monitor closely. Get blood cultures if temp >101.5. Start rocephin and azithromycin    Acute on chronic renal failure: hx CKD II. Creatinine not far from baseline. Will hold any nephrotoxins. Will gently hydrate. Will recheck in am    HYPERLIPIDEMIA: on statin at home check lipid panel.    DEMENTIA: see #1.  Holding aricept and namenda for now    HYPERTENSION: somewhat soft. Holding lasix and ACE inhibitor. Monitor.    CHF (congestive heart failure): echo 4/13 EF 55-60. Holding lasix for now. Daily weight. Does not appear fluid overloaded.   Afib: EKG pending. On coumadin INR 2.36. Request coumadin per pharmacy.    Prosthetic eye globe    Code Status: full Family Communication: son at bedside Disposition Plan: to be determined  Time spent: 65 minutes  [facility] M [facility]   If 7PM-7AM, please contact night-coverage [email or web address] Password [facility] 02/06/2013, 3:08 PM Attending:   Patient seen and examined, above no review. I think her recent deterioration in health is from pneumonia based on symptoms and chest x-ray findings and a elevated white blood cell count. The son, who is in the patient's room, describes a five-year decline in overall health and function. I think she probably has vascular dementia which is contributing to all this. Plan: 1. Intravenous antibiotics for community-acquired pneumonia. 2. Intravenous fluids for hydration. 3. Discussed CODE STATUS with the patient's son, who is the health care power of attorney. He will discuss with his siblings but is favoring DO NOT RESUSCITATE at the moment. For the time being, will continue with full code.

## 2013-02-06 NOTE — ED Notes (Signed)
In and out cath was completed. Pt tolerated well. EKG was completed and given to Dr.Zackowski. RN at bedside now.

## 2013-02-06 NOTE — ED Notes (Signed)
Very fragile and dry skin noted, bilateral elbow skin tears with repositioning in the bed.  Mucous membranes extremely dry with red tongue.  Pt hard of hearing but follows commands.  Family member at bedside.

## 2013-02-06 NOTE — ED Notes (Signed)
Patient unable to void at present

## 2013-02-06 NOTE — Progress Notes (Signed)
Pt came up from ED. Pt is in NAD, no SOB, no dyspnea. MD has been paged that pt is on floor. Will continue to monitor.

## 2013-02-06 NOTE — Progress Notes (Signed)
ANTIBIOTIC CONSULT NOTE - INITIAL  Pharmacy Consult for Rocephin Indication: rule out pneumonia  Allergies  Allergen Reactions  . Haldol (Haloperidol Decanoate)     Over sedation  . Sulfa Antibiotics Other (See Comments)    uknown  . Sulfonamide Derivatives Other (See Comments)    unknown    Patient Measurements: Height: 5\' 3"  (160 cm) Weight: 118 lb 4.8 oz (53.661 kg) IBW/kg (Calculated) : 52.4  Vital Signs: Temp: 98.5 F (36.9 C) (05/07 1433) Temp src: Oral (05/07 1433) BP: 101/58 mmHg (05/07 1433) Pulse Rate: 69 (05/07 1433) Intake/Output from previous day:   Intake/Output from this shift: Total I/O In: -  Out: 100 [Urine:100]  Labs:  Recent Labs  02/06/13 0927  WBC 26.5*  HGB 13.7  PLT 231  CREATININE 1.32*   Estimated Creatinine Clearance: 23.9 ml/min (by C-G formula based on Cr of 1.32). No results found for this basename: VANCOTROUGH, Leodis Binet, VANCORANDOM, GENTTROUGH, GENTPEAK, GENTRANDOM, TOBRATROUGH, TOBRAPEAK, TOBRARND, AMIKACINPEAK, AMIKACINTROU, AMIKACIN,  in the last 72 hours   Microbiology: Recent Results (from the past 720 hour(s))  CULTURE, BLOOD (ROUTINE X 2)     Status: None   Collection Time    02/06/13  9:28 AM      Result Value Range Status   Specimen Description Blood   Final   Special Requests Normal   Final   Culture NO GROWTH <24 HRS   Final   Report Status PENDING   Incomplete  CULTURE, BLOOD (ROUTINE X 2)     Status: None   Collection Time    02/06/13  9:28 AM      Result Value Range Status   Specimen Description Blood   Final   Special Requests Normal   Final   Culture NO GROWTH <24 HRS   Final   Report Status PENDING   Incomplete   Medical History: Past Medical History  Diagnosis Date  . ASCVD (arteriosclerotic cardiovascular disease)     acute MI in 1993;40%left main,80%LAD, 100%RCA-PTCA; EF of 40%;stress nuclear in2007; normal EF;  mixed inferolateral ischemia and infarction; CHF with normal EF in 2011  . CHF  (congestive heart failure)     normal EF;mixed inferolateral ischemic and infarction;chf with normal EF in 2011  . Hypertension   . Hyperlipidemia   . Cerebrovascular disease     CVA by CT in 2007,but not noted in 2009;duplex in 12/2006 plaque without stenosis; Emergency Room evaluation in 12/2010 for transient aphasia and paresis-clopidogrel initially added to ASA Rx, but subsequently warfarin was substituted for these 2 agents  . Tobacco abuse, in remission     quit in 1995  . Claudication   . Dementia     mild  . Kyphosis     severe  . Decreased hearing   . Atrial fibrillation   . Chronic anticoagulation   . Chronic kidney disease   . TIA (transient ischemic attack)    Medications:  Scheduled:  . azithromycin  500 mg Intravenous Q24H  . cefTRIAXone (ROCEPHIN)  IV  1 g Intravenous Q24H  . warfarin  2.5 mg Oral Q24H  . Warfarin - Pharmacist Dosing Inpatient   Does not apply Q24H  . [DISCONTINUED] sodium chloride   Intravenous STAT   Assessment: 79yo female admitted with leukocytosis and suspected pna.  Pt has elevated SCr.  Estimated Creatinine Clearance: 23.9 ml/min (by C-G formula based on Cr of 1.32).  Also on Zithromax.  No renal adjustment needed with Rocephin and Zithromax.  Goal of Therapy:  Eradicate infection.  Plan:  Rocephin 1gm IV q24hrs Zithromax 500mg  IV q24hrs Monitor labs, renal fxn, and cultures  Valrie Hart A 02/06/2013,3:42 PM

## 2013-02-07 ENCOUNTER — Inpatient Hospital Stay (HOSPITAL_COMMUNITY): Payer: PRIVATE HEALTH INSURANCE

## 2013-02-07 DIAGNOSIS — I10 Essential (primary) hypertension: Secondary | ICD-10-CM

## 2013-02-07 LAB — BASIC METABOLIC PANEL
BUN: 18 mg/dL (ref 6–23)
GFR calc Af Amer: 52 mL/min — ABNORMAL LOW (ref >90)
GFR calc non Af Amer: 45 mL/min — ABNORMAL LOW (ref >90)
Potassium: 4.1 mEq/L (ref 3.5–5.1)

## 2013-02-07 LAB — PROTIME-INR
INR: 3.74 — ABNORMAL HIGH (ref 0.00–1.49)
Prothrombin Time: 34.8 seconds — ABNORMAL HIGH (ref 11.6–15.2)

## 2013-02-07 LAB — CBC
Hemoglobin: 11.9 g/dL — ABNORMAL LOW (ref 12.0–15.0)
MCHC: 32.8 g/dL (ref 30.0–36.0)
RDW: 13.8 % (ref 11.5–15.5)

## 2013-02-07 NOTE — Care Management Note (Signed)
    Page 1 of 1   02/08/2013     1:38:19 PM   CARE MANAGEMENT NOTE 02/08/2013  Patient:  Angela [name], Angela [name]   Account Number:  [id number]  Date Initiated:  02/07/2013  Documentation initiated by:  Rosemary Holms  Subjective/Objective Assessment:   Pt admitted from home where she lives with her son. Son very attentive and cares for mother. Son requests nebulizer machine at dc. Her current one is in "bad shape"     Action/Plan:   Anticipated DC Date:  02/08/2013   Anticipated DC Plan:  HOME W HOME HEALTH SERVICES      DC Planning Services  CM consult      Choice offered to / List presented to:     DME arranged  NEBULIZER MACHINE      DME agency  [facility]     [facility] arranged  HH-10 DISEASE MANAGEMENT  HH-1 RN  HH-4 NURSE'S AIDE      HH agency  [facility]   Status of service:  Completed, signed off Medicare Important Message given?   (If response is "NO", the following Medicare IM given date fields will be blank) Date Medicare IM given:   Date Additional Medicare IM given:    Discharge Disposition:  HOME W HOME HEALTH SERVICES  Per UR Regulation:    If discussed at Long Length of Stay Meetings, dates discussed:    Comments:  02/07/13 Rosemary Holms RN BSN CM

## 2013-02-07 NOTE — Progress Notes (Signed)
Subjective: This lady is much improved, she is currently sitting up and eating a banana sandwich that was brought in by her son!           Physical Exam: Blood pressure 100/70, pulse 74, temperature 97.5 F (36.4 C), temperature source Oral, resp. rate 15, height 5\' 3"  (1.6 m), weight 53.661 kg (118 lb 4.8 oz), SpO2 96.00%. She looks systemically well. Lung fields appear to be clearer than yesterday. She is alert and orientated.   Investigations:  Recent Results (from the past 240 hour(s))  CULTURE, BLOOD (ROUTINE X 2)     Status: None   Collection Time    02/06/13  9:28 AM      Result Value Range Status   Specimen Description BLOOD LEFT HAND   Final   Special Requests BOTTLES DRAWN AEROBIC AND ANAEROBIC 6CC   Final   Culture NO GROWTH 1 DAY   Final   Report Status PENDING   Incomplete  CULTURE, BLOOD (ROUTINE X 2)     Status: None   Collection Time    02/06/13  9:28 AM      Result Value Range Status   Specimen Description BLOOD LEFT ANTECUBITAL   Final   Special Requests BOTTLES DRAWN AEROBIC AND ANAEROBIC 8CC   Final   Culture NO GROWTH 1 DAY   Final   Report Status PENDING   Incomplete     Basic Metabolic Panel:  Recent Labs  78/29/56 0927 02/07/13 0559  NA 136 135  K 4.5 4.1  CL 96 100  CO2 29 27  GLUCOSE 106* 104*  BUN 20 18  CREATININE 1.32* 1.07  CALCIUM 9.2 8.4   Liver Function Tests:  Recent Labs  02/06/13 0927  AST 19  ALT 10  ALKPHOS 95  BILITOT 0.7  PROT 6.6  ALBUMIN 3.1*     CBC:  Recent Labs  02/06/13 0927 02/07/13 0559  WBC 26.5* 13.8*  NEUTROABS 22.6*  --   HGB 13.7 11.9*  HCT 41.0 36.3  MCV 90.3 91.4  PLT 231 199    Dg Chest 1 View  02/06/2013  *RADIOLOGY REPORT*  Clinical Data: Weakness, aphasia  CHEST - 1 VIEW  Comparison: 11/12/2012; 10/15/2012; 01/29/2012  Findings:  Grossly unchanged cardiac silhouette and mediastinal contours given decreased lung volumes and marked kyphotic projection.  Grossly unchanged  perihilar heterogeneous opacities.  Possible minimal worsening of left basilar heterogeneous opacities.  No definite pleural effusion or pneumothorax.  Grossly unchanged bones.  IMPRESSION: Degraded examination with possible worsening of left basilar opacities, atelectasis versus infiltrate. Further evaluation with a PA and lateral chest radiograph may be obtained as clinically indicated.   Original Report Authenticated By: Tacey Ruiz, MD    Dg Chest 2 View  02/06/2013  *RADIOLOGY REPORT*  Clinical Data: Weakness, aphasia  CHEST - 2 VIEW  Comparison: 02/06/2013 07/01 10/15/2012; 04/28 02/20/2012  Findings: Grossly unchanged cardiac silhouette and mediastinal contours given persistent kyphotic projection.  Atherosclerotic calcifications within a slightly tortuous thoracic aorta.  Improved aeration of the bilateral lung bases.  No new focal airspace opacities.  No definite pleural effusion or pneumothorax.  No definite evidence of edema.  Grossly unchanged bones including marked kyphosis of the thoracic spine.  IMPRESSION:  1.  No acute cardiopulmonary disease.  2.  Improved aeration of the bilateral lung bases suggests resolved atelectasis.   Original Report Authenticated By: Tacey Ruiz, MD    Ct Head Wo Contrast  02/06/2013  *RADIOLOGY  REPORT*  Clinical Data: Weakness and aphasia.  CT HEAD WITHOUT CONTRAST  Technique:  Contiguous axial images were obtained from the base of the skull through the vertex without contrast.  Comparison: 10/15/2012.  Findings: Motion degraded exam.  No intracranial hemorrhage.  Prominent small vessel disease type changes and remote infarcts. No CT evidence of large acute infarct.  Difficult to exclude small to moderate sized infarct given the degree white matter type changes.  Calcification right hemisphere consistent with vascular malformation which is without significant change.  No other intracranial mass lesion noted on this unenhanced exam.  Post right orbital exoneration.   Global atrophy without hydrocephalus.  IMPRESSION: No intracranial hemorrhage  Prominent small vessel disease type changes and remote infarcts. No CT evidence of large acute infarct.  Difficult to exclude small to moderate sized infarct given the degree white matter type changes.  Global atrophy.  Similar appearance of calcified arteriovenous malformation.   Original Report Authenticated By: Lacy Duverney, M.D.    Dg Chest Port 1 View  02/07/2013  *RADIOLOGY REPORT*  Clinical Data: Follow up infiltrates  PORTABLE CHEST - 1 VIEW  Comparison: Portable exam 0655 hours compared to 02/06/2013  Findings: Marked thoracic kyphosis with resultant kyphotic positioning on exam. Enlargement of cardiac silhouette. Calcified tortuous aorta less well visualized on current study. Increased markings at left base question developing infiltrate versus atelectasis. Remaining lungs grossly clear. Bones severely demineralized.  IMPRESSION: Marked thoracic kyphosis. Enlargement of cardiac silhouette. Question atelectasis versus developing infiltrate left base.   Original Report Authenticated By: Ulyses Southward, M.D.       Medications: I have reviewed the patient's current medications.  Impression: 1. Altered mental status, improving. 2. Pneumonia, community acquired. 3. Mild to moderate dementia. 4. Hypertension. Currently somewhat hypotensive. 5. Acute on chronic renal failure, improving.     Plan: 1. Discontinue IV fluids and encourage oral intake. 2. Physical therapy evaluation.     LOS: 1 day   Wilson Singer Pager [phone]  02/07/2013, 12:16 PM

## 2013-02-07 NOTE — Progress Notes (Signed)
Utilization Review Complete  

## 2013-02-07 NOTE — Progress Notes (Signed)
ANTICOAGULATION CONSULT NOTE   Pharmacy Consult for Coumadin (chronic PTA) Indication: atrial fibrillation  Allergies  Allergen Reactions  . Haldol (Haloperidol Decanoate)     Over sedation  . Sulfa Antibiotics Other (See Comments)    uknown  . Sulfonamide Derivatives Other (See Comments)    unknown   Patient Measurements: Height: 5\' 3"  (160 cm) Weight: 118 lb 4.8 oz (53.661 kg) IBW/kg (Calculated) : 52.4  Vital Signs: Temp: 97.5 F (36.4 C) (05/08 0500) Temp src: Oral (05/08 0500) BP: 100/70 mmHg (05/08 0500) Pulse Rate: 74 (05/08 0500)  Labs:  Recent Labs  02/06/13 0927 02/07/13 0559  HGB 13.7 11.9*  HCT 41.0 36.3  PLT 231 199  APTT 58*  --   LABPROT 27.8* 34.8*  INR 2.76* 3.74*  CREATININE 1.32* 1.07    Estimated Creatinine Clearance: 29.5 ml/min (by C-G formula based on Cr of 1.07).  Medical History: Past Medical History  Diagnosis Date  . ASCVD (arteriosclerotic cardiovascular disease)     acute MI in 1993;40%left main,80%LAD, 100%RCA-PTCA; EF of 40%;stress nuclear in2007; normal EF;  mixed inferolateral ischemia and infarction; CHF with normal EF in 2011  . CHF (congestive heart failure)     normal EF;mixed inferolateral ischemic and infarction;chf with normal EF in 2011  . Hypertension   . Hyperlipidemia   . Cerebrovascular disease     CVA by CT in 2007,but not noted in 2009;duplex in 12/2006 plaque without stenosis; Emergency Room evaluation in 12/2010 for transient aphasia and paresis-clopidogrel initially added to ASA Rx, but subsequently warfarin was substituted for these 2 agents  . Tobacco abuse, in remission     quit in 1995  . Claudication   . Dementia     mild  . Kyphosis     severe  . Decreased hearing   . Atrial fibrillation   . Chronic anticoagulation   . Chronic kidney disease   . TIA (transient ischemic attack)    Medications:  Prescriptions prior to admission  Medication Sig Dispense Refill  . albuterol (PROVENTIL) (2.5 MG/3ML)  0.083% nebulizer solution Take 2.5 mg by nebulization every 4 (four) hours as needed. For shortness of breath/cough      . albuterol-ipratropium (COMBIVENT) 18-103 MCG/ACT inhaler Inhale 2 puffs into the lungs every 6 (six) hours as needed. For shortness of breath      . alendronate (FOSAMAX) 70 MG tablet Take 70 mg by mouth every 7 (seven) days. Take with a full glass of water on an empty stomach.       . cetirizine (ZYRTEC) 10 MG tablet Take 10 mg by mouth at bedtime.       . Cholecalciferol (VITAMIN D) 1000 UNITS capsule Take 2,000 Units by mouth every morning.       . clobetasol cream (TEMOVATE) 0.05 % Apply 1 application topically once a week.      . Dietary Management Product (AXONA) packet Take 40 g by mouth daily. 1/2 packet  Given daily in the morning      . donepezil (ARICEPT) 10 MG tablet Take 10 mg by mouth every morning.      . furosemide (LASIX) 40 MG tablet Take 40 mg by mouth daily. *Given on an empty stomach 30 minutes before eating      . lisinopril (PRINIVIL,ZESTRIL) 2.5 MG tablet Take 2.5 mg by mouth every morning.       Marland Kitchen LORazepam (ATIVAN) 0.5 MG tablet Take 0.5 mg by mouth 3 (three) times daily. Anxiety and or agitation. *Last  dose is not to be given, if patient receives sleeping medication HALCION (TRIAZOLAM)*      . memantine (NAMENDA) 10 MG tablet Take 10 mg by mouth 2 (two) times daily.       . Multiple Vitamins-Minerals (MULTIVITAMIN WITH MINERALS) tablet Take 1 tablet by mouth every morning.       . nitroGLYCERIN (NITROSTAT) 0.4 MG SL tablet Place 0.4 mg under the tongue every 5 (five) minutes as needed. For chest pain       . Omega-3 Fatty Acids (FISH OIL) 1000 MG CAPS Take 1,200 mg by mouth 2 (two) times daily.       . polyethylene glycol (MIRALAX / GLYCOLAX) packet Take 17 g by mouth daily as needed. constip      . potassium chloride SA (K-DUR,KLOR-CON) 20 MEQ tablet Take 20 mEq by mouth every morning.       . pravastatin (PRAVACHOL) 40 MG tablet Take 1 tablet (40 mg  total) by mouth at bedtime.  30 tablet  10  . Salicylic Acid (SALEX) 6 % SHAM Apply 1 application topically once a week. For psoriasis      . triazolam (HALCION) 0.125 MG tablet Take 0.125 mg by mouth at bedtime as needed (*IF THIS MEDICATION IS GIVEN, PATIENT DOES NOT TAKE NIGHT DOSE OF ATIVAN (LORAZEPAM)*). For sleep      . vitamin C (ASCORBIC ACID) 500 MG tablet Take 500 mg by mouth every morning.       . warfarin (COUMADIN) 2.5 MG tablet Take 2.5 mg by mouth every evening.        Assessment: 79yo female maintained on chronic Coumadin for afib.  Home dose is listed above (2.5mg  daily).  INR was therapeutic on admission but has now trended up to supra-therapeutic range, most likely due to interacting meds and acute illness.  Zithromax has been ordered which may interact with warfarin.    Goal of Therapy:  INR 2-3 Monitor platelets by anticoagulation protocol: Yes   Plan:  HOLD coumadin today INR daily  Valrie Hart A 02/07/2013,11:34 AM

## 2013-02-08 LAB — BASIC METABOLIC PANEL
GFR calc Af Amer: 53 mL/min — ABNORMAL LOW (ref >90)
GFR calc non Af Amer: 46 mL/min — ABNORMAL LOW (ref >90)
Potassium: 4.1 mEq/L (ref 3.5–5.1)
Sodium: 137 mEq/L (ref 135–145)

## 2013-02-08 LAB — CBC
Hemoglobin: 11.8 g/dL — ABNORMAL LOW (ref 12.0–15.0)
RBC: 3.94 MIL/uL (ref 3.87–5.11)
WBC: 8.6 10*3/uL (ref 4.0–10.5)

## 2013-02-08 LAB — PROTIME-INR
INR: 3.48 — ABNORMAL HIGH (ref 0.00–1.49)
Prothrombin Time: 33 seconds — ABNORMAL HIGH (ref 11.6–15.2)

## 2013-02-08 NOTE — Evaluation (Signed)
Physical Therapy Evaluation Patient Details Name: Angela [name] MRN: [id number] DOB: [birth year] Today's Date: 02/08/2013 Time: 6295-2841 PT Time Calculation (min): 51 min  PT Assessment / Plan / Recommendation Clinical Impression  Pt was seen for eval/tx.  She is pleasantly demented, son present.  She was able to follow simple directions and all of her mobility is back to baseline.  She needed only supervision for transfers and was able to ambulate 100' using a walker with no loss of balance.  We will ask nursing service to ambulate her in the hallway.    PT Assessment  Patent does not need any further PT services    Follow Up Recommendations  No PT follow up    Does the patient have the potential to tolerate intense rehabilitation      Barriers to Discharge        Equipment Recommendations  None recommended by PT    Recommendations for Other Services     Frequency      Precautions / Restrictions Precautions Precautions: Fall Restrictions Weight Bearing Restrictions: No   Pertinent Vitals/Pain       Mobility  Bed Mobility Bed Mobility: Supine to Sit Supine to Sit: 6: Modified independent (Device/Increase time) Transfers Transfers: Sit to Stand;Stand to Sit Sit to Stand: 5: Supervision;From bed;From chair/3-in-1;From toilet Stand to Sit: 5: Supervision;To chair/3-in-1;To toilet Ambulation/Gait Ambulation/Gait Assistance: 5: Supervision Ambulation Distance (Feet): 100 Feet (then 20'x2) Assistive device: Rolling walker Gait Pattern: Within Functional Limits General Gait Details: pt was able to stand at sink and wash her hands after using the bathroom Stairs: No Wheelchair Mobility Wheelchair Mobility: No    Exercises     PT Diagnosis:    PT Problem List:   PT Treatment Interventions:     PT Goals    Visit Information  Last PT Received On: 02/08/13    Subjective Data  Subjective: son is concerned that Mom won't be able to walk...she usually uses a walker  at home Patient Stated Goal: back home   Prior Functioning  Home Living Lives With: Son Available Help at Discharge: Family;Personal care attendant;Available 24 hours/day Type of Home: House Home Access: Ramped entrance Home Layout: One level Bathroom Shower/Tub: Tub/shower unit;Walk-in shower Home Adaptive Equipment: Hospital bed;Walker - rolling;Wheelchair - manual Prior Function Level of Independence: Needs assistance Needs Assistance: Bathing;Dressing;Grooming;Toileting;Meal Prep;Light Housekeeping Bath: Moderate Dressing: Moderate Grooming: Moderate Toileting: Moderate Meal Prep: Total Light Housekeeping: Total Able to Take Stairs?: No Driving: No Vocation: Retired    Museum/gallery curator: Awake/alert Behavior During Therapy: WFL for tasks assessed/performed Overall Cognitive Status: History of cognitive impairments - at baseline    Extremity/Trunk Assessment Right Lower Extremity Assessment RLE ROM/Strength/Tone: Within functional levels RLE Sensation: WFL - Light Touch RLE Coordination: WFL - gross motor Left Lower Extremity Assessment LLE ROM/Strength/Tone: Within functional levels LLE Sensation: WFL - Light Touch LLE Coordination: WFL - gross motor Trunk Assessment Trunk Assessment: Kyphotic (severe)   Balance Balance Balance Assessed: No  End of Session PT - End of Session Equipment Utilized During Treatment: Gait belt Activity Tolerance: Patient tolerated treatment well Patient left: in chair;with call bell/phone within reach;with chair alarm set;with family/visitor present Nurse Communication: Mobility status  GP     Myrlene Broker L 02/08/2013, 1:10 PM

## 2013-02-08 NOTE — Progress Notes (Signed)
ANTICOAGULATION CONSULT NOTE   Pharmacy Consult for Coumadin (chronic PTA) Indication: atrial fibrillation  Allergies  Allergen Reactions  . Haldol (Haloperidol Decanoate)     Over sedation  . Sulfa Antibiotics Other (See Comments)    uknown  . Sulfonamide Derivatives Other (See Comments)    unknown   Patient Measurements: Height: 5\' 3"  (160 cm) Weight: 118 lb 4.8 oz (53.661 kg) IBW/kg (Calculated) : 52.4  Vital Signs: Temp: 97.9 F (36.6 C) (05/09 0500) Temp src: Oral (05/09 0500) BP: 94/68 mmHg (05/09 0500) Pulse Rate: 85 (05/09 0500)  Labs:  Recent Labs  02/06/13 0927 02/07/13 0559 02/08/13 0559  HGB 13.7 11.9* 11.8*  HCT 41.0 36.3 36.2  PLT 231 199 201  APTT 58*  --   --   LABPROT 27.8* 34.8* 33.0*  INR 2.76* 3.74* 3.48*  CREATININE 1.32* 1.07 1.05    Estimated Creatinine Clearance: 30 ml/min (by C-G formula based on Cr of 1.05).  Medical History: Past Medical History  Diagnosis Date  . ASCVD (arteriosclerotic cardiovascular disease)     acute MI in 1993;40%left main,80%LAD, 100%RCA-PTCA; EF of 40%;stress nuclear in2007; normal EF;  mixed inferolateral ischemia and infarction; CHF with normal EF in 2011  . CHF (congestive heart failure)     normal EF;mixed inferolateral ischemic and infarction;chf with normal EF in 2011  . Hypertension   . Hyperlipidemia   . Cerebrovascular disease     CVA by CT in 2007,but not noted in 2009;duplex in 12/2006 plaque without stenosis; Emergency Room evaluation in 12/2010 for transient aphasia and paresis-clopidogrel initially added to ASA Rx, but subsequently warfarin was substituted for these 2 agents  . Tobacco abuse, in remission     quit in 1995  . Claudication   . Dementia     mild  . Kyphosis     severe  . Decreased hearing   . Atrial fibrillation   . Chronic anticoagulation   . Chronic kidney disease   . TIA (transient ischemic attack)    Medications:  Prescriptions prior to admission  Medication Sig  Dispense Refill  . albuterol (PROVENTIL) (2.5 MG/3ML) 0.083% nebulizer solution Take 2.5 mg by nebulization every 4 (four) hours as needed. For shortness of breath/cough      . albuterol-ipratropium (COMBIVENT) 18-103 MCG/ACT inhaler Inhale 2 puffs into the lungs every 6 (six) hours as needed. For shortness of breath      . alendronate (FOSAMAX) 70 MG tablet Take 70 mg by mouth every 7 (seven) days. Take with a full glass of water on an empty stomach.       . cetirizine (ZYRTEC) 10 MG tablet Take 10 mg by mouth at bedtime.       . Cholecalciferol (VITAMIN D) 1000 UNITS capsule Take 2,000 Units by mouth every morning.       . clobetasol cream (TEMOVATE) 0.05 % Apply 1 application topically once a week.      . Dietary Management Product (AXONA) packet Take 40 g by mouth daily. 1/2 packet  Given daily in the morning      . donepezil (ARICEPT) 10 MG tablet Take 10 mg by mouth every morning.      . furosemide (LASIX) 40 MG tablet Take 40 mg by mouth daily. *Given on an empty stomach 30 minutes before eating      . lisinopril (PRINIVIL,ZESTRIL) 2.5 MG tablet Take 2.5 mg by mouth every morning.       Marland Kitchen LORazepam (ATIVAN) 0.5 MG tablet Take 0.5 mg  by mouth 3 (three) times daily. Anxiety and or agitation. *Last dose is not to be given, if patient receives sleeping medication HALCION (TRIAZOLAM)*      . memantine (NAMENDA) 10 MG tablet Take 10 mg by mouth 2 (two) times daily.       . Multiple Vitamins-Minerals (MULTIVITAMIN WITH MINERALS) tablet Take 1 tablet by mouth every morning.       . nitroGLYCERIN (NITROSTAT) 0.4 MG SL tablet Place 0.4 mg under the tongue every 5 (five) minutes as needed. For chest pain       . Omega-3 Fatty Acids (FISH OIL) 1000 MG CAPS Take 1,200 mg by mouth 2 (two) times daily.       . polyethylene glycol (MIRALAX / GLYCOLAX) packet Take 17 g by mouth daily as needed. constip      . potassium chloride SA (K-DUR,KLOR-CON) 20 MEQ tablet Take 20 mEq by mouth every morning.       .  pravastatin (PRAVACHOL) 40 MG tablet Take 1 tablet (40 mg total) by mouth at bedtime.  30 tablet  10  . Salicylic Acid (SALEX) 6 % SHAM Apply 1 application topically once a week. For psoriasis      . triazolam (HALCION) 0.125 MG tablet Take 0.125 mg by mouth at bedtime as needed (*IF THIS MEDICATION IS GIVEN, PATIENT DOES NOT TAKE NIGHT DOSE OF ATIVAN (LORAZEPAM)*). For sleep      . vitamin C (ASCORBIC ACID) 500 MG tablet Take 500 mg by mouth every morning.       . warfarin (COUMADIN) 2.5 MG tablet Take 2.5 mg by mouth every evening.        Assessment: 79yo female maintained on chronic Coumadin for afib.  Home dose is listed above (2.5mg  daily).  INR was therapeutic on admission but has now trended up to supra-therapeutic range, most likely due to interacting meds and acute illness.  Zithromax has been ordered which may interact with warfarin.    Goal of Therapy:  INR 2-3 Monitor platelets by anticoagulation protocol: Yes   Plan:  HOLD coumadin today INR daily  Angela [name] A 02/08/2013,9:32 AM

## 2013-02-08 NOTE — Progress Notes (Signed)
     Subjective: This lady continues to improve. She has not walked yet and she still has a Foley catheter in situ.           Physical Exam: Blood pressure 94/68, pulse 85, temperature 97.9 F (36.6 C), temperature source Oral, resp. rate 14, height 5\' 3"  (1.6 m), weight 53.661 kg (118 lb 4.8 oz), SpO2 99.00%. She looks systemically well. Lung fields appear to be clear. She is alert and orientated.   Investigations:  Recent Results (from the past 240 hour(s))  CULTURE, BLOOD (ROUTINE X 2)     Status: None   Collection Time    02/06/13  9:28 AM      Result Value Range Status   Specimen Description BLOOD LEFT HAND   Final   Special Requests BOTTLES DRAWN AEROBIC AND ANAEROBIC 6CC   Final   Culture NO GROWTH 2 DAYS   Final   Report Status PENDING   Incomplete  CULTURE, BLOOD (ROUTINE X 2)     Status: None   Collection Time    02/06/13  9:28 AM      Result Value Range Status   Specimen Description BLOOD LEFT ANTECUBITAL   Final   Special Requests BOTTLES DRAWN AEROBIC AND ANAEROBIC 8CC   Final   Culture NO GROWTH 2 DAYS   Final   Report Status PENDING   Incomplete     Basic Metabolic Panel:  Recent Labs  69/62/95 0559 02/08/13 0559  NA 135 137  K 4.1 4.1  CL 100 101  CO2 27 29  GLUCOSE 104* 102*  BUN 18 14  CREATININE 1.07 1.05  CALCIUM 8.4 8.3*   Liver Function Tests:  Recent Labs  02/06/13 0927  AST 19  ALT 10  ALKPHOS 95  BILITOT 0.7  PROT 6.6  ALBUMIN 3.1*     CBC:  Recent Labs  02/06/13 0927 02/07/13 0559 02/08/13 0559  WBC 26.5* 13.8* 8.6  NEUTROABS 22.6*  --   --   HGB 13.7 11.9* 11.8*  HCT 41.0 36.3 36.2  MCV 90.3 91.4 91.9  PLT 231 199 201    Dg Chest Port 1 View  02/07/2013  *RADIOLOGY REPORT*  Clinical Data: Follow up infiltrates  PORTABLE CHEST - 1 VIEW  Comparison: Portable exam 0655 hours compared to 02/06/2013  Findings: Marked thoracic kyphosis with resultant kyphotic positioning on exam. Enlargement of cardiac  silhouette. Calcified tortuous aorta less well visualized on current study. Increased markings at left base question developing infiltrate versus atelectasis. Remaining lungs grossly clear. Bones severely demineralized.  IMPRESSION: Marked thoracic kyphosis. Enlargement of cardiac silhouette. Question atelectasis versus developing infiltrate left base.   Original Report Authenticated By: Ulyses Southward, M.D.       Medications: I have reviewed the patient's current medications.  Impression: 1. Altered mental status, improving. 2. Pneumonia, community acquired. 3. Mild to moderate dementia. 4. Hypertension. Currently somewhat hypotensive. 5. Acute on chronic renal failure, improving.     Plan: 1. Discontinue Foley catheter. 2. Mobilize. 3. Discharge home tomorrow if medically stable.     LOS: 2 days   Wilson Singer Pager [phone]  02/08/2013, 11:42 AM

## 2013-02-09 LAB — PROTIME-INR
INR: 3.35 — ABNORMAL HIGH (ref 0.00–1.49)
Prothrombin Time: 32.1 seconds — ABNORMAL HIGH (ref 11.6–15.2)

## 2013-02-09 MED ORDER — AZITHROMYCIN 250 MG PO TABS
500.0000 mg | ORAL_TABLET | Freq: Every day | ORAL | Status: DC
  Administered 2013-02-09: 500 mg via ORAL
  Filled 2013-02-09: qty 2

## 2013-02-09 MED ORDER — LEVOFLOXACIN 500 MG PO TABS: 500.0000 mg | ORAL_TABLET | Freq: Every day | ORAL | Status: AC

## 2013-02-09 MED ORDER — GUAIFENESIN-DM 100-10 MG/5ML PO SYRP: 5.0000 mL | ORAL_SOLUTION | ORAL | Status: DC | PRN

## 2013-02-09 NOTE — Progress Notes (Signed)
Assisted pt to bathroom and back to bed. Wheezing in upper lobes noted with exertion. Respiratory called.

## 2013-02-09 NOTE — Progress Notes (Signed)
Pt discharged home today with [facility]. Pt's IV site D/C'd and and WNL. Pt's VS stable at this time. [facility] orders faxed for [facility] PT and RN before discharge. Family made aware. Verbalized understanding. Son provided with home medication list, discharge instructions and prescriptions. Verbalized understanding. Pt left floor via WC in stable condition accompanied by NT.

## 2013-02-09 NOTE — Progress Notes (Signed)
Respiratory gave pt neb treatment. No wheezing noted at this time. Pt resting comfortably.

## 2013-02-09 NOTE — Discharge Summary (Signed)
Physician Discharge Summary  Angela [name] ZOX:[id number] DOB: 1921/04/08 DOA: 02/06/2013  PCP: Cassell Smiles., MD  Admit date: 02/06/2013 Discharge date: 02/09/2013  Time spent: Greater than 30 minutes  Recommendations for Outpatient Follow-up:  1. Followup with primary care physician in the next couple weeks.   Discharge Diagnoses:  1. Community-acquired pneumonia, bilateral, improving. 2. Altered mental status, secondary to #1. 3. Moderate dementia. 4. Hypertension. 5. History of congestive heart failure, compensated. 6. Acute on chronic renal failure, improved.   Discharge Condition: Stable.  Diet recommendation: Regular.  Filed Weights   02/06/13 1433  Weight: 53.661 kg (118 lb 4.8 oz)    History of present illness:  This very pleasant 79 year old lady presents to the hospital with symptoms of altered mental status. Please see initial history as outlined below: HPI: Angela [name] is a 79 y.o. female with past medical hx that includes CHF, HTN, CVA, afib on anticoagulation, CKD, dementia who presents from home with cc ams. Information obtained from son who lives with patient and provides care. He reports that 3 days ago pt became weak, lethargic, unable to walk and usually able with walker. He reports she stopped eating and drinking. 2 days ago developed a cough with thick sputum and became confused at times. He states pt short term memory bad at baseline but she was having trouble remembering where her room was. Denies fever, vomiting, diarrhea. States pt did not complain of any kind of pain, or sob. He denies noticing slurred speech or focal weakness in pt. He stated that this am when condition did not improve he brought to ED. Symptoms came on gradually have persisted and worsened. Symptoms characterized as moderate. Work up in Ed yields white count 26.5, creatine 1.3, CT of head No intracranial hemorrhage Prominent small vessel disease type changes and remote infarcts. No CT  evidence of large acute infarct. Difficult to exclude small to moderate sized infarct given the degree white matter type changes. Chest xray with degraded examination with possible worsening of left basilar opacities, atelectasis versus infiltrate. Pt with temp 100.8, SBP range97-103 and HR 48-86. TRH asked to admit  Hospital Course:  Patient was admitted and started on empirical antibiotics for community-acquired pneumonia with ceftriaxone and azithromycin. She did well with this together with intravenous fluids as she was clinically dehydrated on admission. Her altered mental status cleared and she returned to her baseline status. She does have mild to moderate dementia is at baseline. She has been recently failing to thrive and her son is concerned that she may further deteriorate in the future. I agree with this. In the meantime, she did improve during hospitalization, she has been afebrile and more alert. She is able to take in oral intake without any signs of aspiration. She is now stable for discharge with a further one week course of oral antibiotics.  Procedures:  None.  Consultations:  None.  Discharge Exam: Filed Vitals:   02/08/13 1926 02/08/13 2113 02/09/13 0601 02/09/13 0628  BP:  123/76 115/72   Pulse:  115 172   Temp:  97.8 F (36.6 C) 98 F (36.7 C)   TempSrc:  Oral Oral   Resp:  20 18   Height:      Weight:      SpO2: 94% 91% 95% 94%    General: She looks systemically well. There is no evidence of sepsis clinically. She is frail. Cardiovascular: Heart sounds are present and normal without murmurs. She appears to be in atrial fibrillation.  Respiratory: Lung fields are clinically clear anteriorly and a few crackles at both bases posteriorly.  She is alert and orientated today.   Discharge Instructions  Discharge Orders   Future Appointments Provider Department Dept Phone   02/18/2013 3:40 PM [facility] [place] [phone]    Future Orders Complete By Expires     Diet - low sodium heart healthy  As directed     Increase activity slowly  As directed         Medication List    STOP taking these medications       furosemide 40 MG tablet  Commonly known as:  LASIX     potassium chloride SA 20 MEQ tablet  Commonly known as:  K-DUR,KLOR-CON      TAKE these medications       albuterol (2.5 MG/3ML) 0.083% nebulizer solution  Commonly known as:  PROVENTIL  Take 2.5 mg by nebulization every 4 (four) hours as needed. For shortness of breath/cough     alendronate 70 MG tablet  Commonly known as:  FOSAMAX  Take 70 mg by mouth every 7 (seven) days. Take with a full glass of water on an empty stomach.     AXONA packet  Take 40 g by mouth daily. 1/2 packet  Given daily in the morning     cetirizine 10 MG tablet  Commonly known as:  ZYRTEC  Take 10 mg by mouth at bedtime.     clobetasol cream 0.05 %  Commonly known as:  TEMOVATE  Apply 1 application topically once a week.     COMBIVENT 18-103 MCG/ACT inhaler  Generic drug:  albuterol-ipratropium  Inhale 2 puffs into the lungs every 6 (six) hours as needed. For shortness of breath     donepezil 10 MG tablet  Commonly known as:  ARICEPT  Take 10 mg by mouth every morning.     Fish Oil 1000 MG Caps  Take 1,200 mg by mouth 2 (two) times daily.     guaiFENesin-dextromethorphan 100-10 MG/5ML syrup  Commonly known as:  ROBITUSSIN DM  Take 5 mLs by mouth every 4 (four) hours as needed for cough.     levofloxacin 500 MG tablet  Commonly known as:  LEVAQUIN  Take 1 tablet (500 mg total) by mouth daily.     lisinopril 2.5 MG tablet  Commonly known as:  PRINIVIL,ZESTRIL  Take 2.5 mg by mouth every morning.     LORazepam 0.5 MG tablet  Commonly known as:  ATIVAN  Take 0.5 mg by mouth 3 (three) times daily. Anxiety and or agitation. *Last dose is not to be given, if patient receives sleeping medication HALCION (TRIAZOLAM)*     memantine 10 MG tablet   Commonly known as:  NAMENDA  Take 10 mg by mouth 2 (two) times daily.     multivitamin with minerals tablet  Take 1 tablet by mouth every morning.     nitroGLYCERIN 0.4 MG SL tablet  Commonly known as:  NITROSTAT  Place 0.4 mg under the tongue every 5 (five) minutes as needed. For chest pain     polyethylene glycol packet  Commonly known as:  MIRALAX / GLYCOLAX  Take 17 g by mouth daily as needed. constip     pravastatin 40 MG tablet  Commonly known as:  PRAVACHOL  Take 1 tablet (40 mg total) by mouth at bedtime.     SALEX 6 % Sham  Generic drug:  Salicylic Acid  Apply 1 application topically  once a week. For psoriasis     triazolam 0.125 MG tablet  Commonly known as:  HALCION  Take 0.125 mg by mouth at bedtime as needed (*IF THIS MEDICATION IS GIVEN, PATIENT DOES NOT TAKE NIGHT DOSE OF ATIVAN (LORAZEPAM)*). For sleep     vitamin C 500 MG tablet  Commonly known as:  ASCORBIC ACID  Take 500 mg by mouth every morning.     Vitamin D 1000 UNITS capsule  Take 2,000 Units by mouth every morning.     warfarin 2.5 MG tablet  Commonly known as:  COUMADIN  Take 2.5 mg by mouth every evening.       Allergies  Allergen Reactions  . Haldol (Haloperidol Decanoate)     Over sedation  . Sulfa Antibiotics Other (See Comments)    uknown  . Sulfonamide Derivatives Other (See Comments)    unknown      The results of significant diagnostics from this hospitalization (including imaging, microbiology, ancillary and laboratory) are listed below for reference.    Significant Diagnostic Studies: Dg Chest 1 View  02/06/2013  *RADIOLOGY REPORT*  Clinical Data: Weakness, aphasia  CHEST - 1 VIEW  Comparison: 11/12/2012; 10/15/2012; 01/29/2012  Findings:  Grossly unchanged cardiac silhouette and mediastinal contours given decreased lung volumes and marked kyphotic projection.  Grossly unchanged perihilar heterogeneous opacities.  Possible minimal worsening of left basilar heterogeneous  opacities.  No definite pleural effusion or pneumothorax.  Grossly unchanged bones.  IMPRESSION: Degraded examination with possible worsening of left basilar opacities, atelectasis versus infiltrate. Further evaluation with a PA and lateral chest radiograph may be obtained as clinically indicated.   Original Report Authenticated By: Tacey Ruiz, MD    Dg Chest 2 View  02/06/2013  *RADIOLOGY REPORT*  Clinical Data: Weakness, aphasia  CHEST - 2 VIEW  Comparison: 02/06/2013 07/01 10/15/2012; 04/28 02/20/2012  Findings: Grossly unchanged cardiac silhouette and mediastinal contours given persistent kyphotic projection.  Atherosclerotic calcifications within a slightly tortuous thoracic aorta.  Improved aeration of the bilateral lung bases.  No new focal airspace opacities.  No definite pleural effusion or pneumothorax.  No definite evidence of edema.  Grossly unchanged bones including marked kyphosis of the thoracic spine.  IMPRESSION:  1.  No acute cardiopulmonary disease.  2.  Improved aeration of the bilateral lung bases suggests resolved atelectasis.   Original Report Authenticated By: Tacey Ruiz, MD    Ct Head Wo Contrast  02/06/2013  *RADIOLOGY REPORT*  Clinical Data: Weakness and aphasia.  CT HEAD WITHOUT CONTRAST  Technique:  Contiguous axial images were obtained from the base of the skull through the vertex without contrast.  Comparison: 10/15/2012.  Findings: Motion degraded exam.  No intracranial hemorrhage.  Prominent small vessel disease type changes and remote infarcts. No CT evidence of large acute infarct.  Difficult to exclude small to moderate sized infarct given the degree white matter type changes.  Calcification right hemisphere consistent with vascular malformation which is without significant change.  No other intracranial mass lesion noted on this unenhanced exam.  Post right orbital exoneration.  Global atrophy without hydrocephalus.  IMPRESSION: No intracranial hemorrhage  Prominent small  vessel disease type changes and remote infarcts. No CT evidence of large acute infarct.  Difficult to exclude small to moderate sized infarct given the degree white matter type changes.  Global atrophy.  Similar appearance of calcified arteriovenous malformation.   Original Report Authenticated By: Lacy Duverney, M.D.    Dg Chest Port 1 View  02/07/2013  *  RADIOLOGY REPORT*  Clinical Data: Follow up infiltrates  PORTABLE CHEST - 1 VIEW  Comparison: Portable exam 0655 hours compared to 02/06/2013  Findings: Marked thoracic kyphosis with resultant kyphotic positioning on exam. Enlargement of cardiac silhouette. Calcified tortuous aorta less well visualized on current study. Increased markings at left base question developing infiltrate versus atelectasis. Remaining lungs grossly clear. Bones severely demineralized.  IMPRESSION: Marked thoracic kyphosis. Enlargement of cardiac silhouette. Question atelectasis versus developing infiltrate left base.   Original Report Authenticated By: Ulyses Southward, M.D.     Microbiology: Recent Results (from the past 240 hour(s))  CULTURE, BLOOD (ROUTINE X 2)     Status: None   Collection Time    02/06/13  9:28 AM      Result Value Range Status   Specimen Description BLOOD LEFT HAND   Final   Special Requests BOTTLES DRAWN AEROBIC AND ANAEROBIC 6CC   Final   Culture NO GROWTH 3 DAYS   Final   Report Status PENDING   Incomplete  CULTURE, BLOOD (ROUTINE X 2)     Status: None   Collection Time    02/06/13  9:28 AM      Result Value Range Status   Specimen Description BLOOD LEFT ANTECUBITAL   Final   Special Requests BOTTLES DRAWN AEROBIC AND ANAEROBIC 8CC   Final   Culture NO GROWTH 3 DAYS   Final   Report Status PENDING   Incomplete     Labs: Basic Metabolic Panel:  Recent Labs Lab 02/06/13 0927 02/07/13 0559 02/08/13 0559  NA 136 135 137  K 4.5 4.1 4.1  CL 96 100 101  CO2 29 27 29   GLUCOSE 106* 104* 102*  BUN 20 18 14   CREATININE 1.32* 1.07 1.05   CALCIUM 9.2 8.4 8.3*   Liver Function Tests:  Recent Labs Lab 02/06/13 0927  AST 19  ALT 10  ALKPHOS 95  BILITOT 0.7  PROT 6.6  ALBUMIN 3.1*     CBC:  Recent Labs Lab 02/06/13 0927 02/07/13 0559 02/08/13 0559  WBC 26.5* 13.8* 8.6  NEUTROABS 22.6*  --   --   HGB 13.7 11.9* 11.8*  HCT 41.0 36.3 36.2  MCV 90.3 91.4 91.9  PLT 231 199 201         Signed:  GOSRANI,NIMISH C  [facility] 02/09/2013, 11:35 AM

## 2013-02-09 NOTE — Progress Notes (Signed)
Skipped pt's scheduled ambulation due to sleep. Son at bedside. No sign of distress at this time.

## 2013-02-09 NOTE — Progress Notes (Addendum)
ANTICOAGULATION CONSULT NOTE   Pharmacy Consult for Coumadin (chronic PTA) Indication: atrial fibrillation  Allergies  Allergen Reactions  . Haldol (Haloperidol Decanoate)     Over sedation  . Sulfa Antibiotics Other (See Comments)    uknown  . Sulfonamide Derivatives Other (See Comments)    unknown   Patient Measurements: Height: 5\' 3"  (160 cm) Weight: 118 lb 4.8 oz (53.661 kg) IBW/kg (Calculated) : 52.4  Vital Signs: Temp: 98 F (36.7 C) (05/10 0601) Temp src: Oral (05/10 0601) BP: 115/72 mmHg (05/10 0601) Pulse Rate: 172 (05/10 0601)  Labs:  Recent Labs  02/06/13 0927 02/07/13 0559 02/08/13 0559 02/09/13 0535  HGB 13.7 11.9* 11.8*  --   HCT 41.0 36.3 36.2  --   PLT 231 199 201  --   APTT 58*  --   --   --   LABPROT 27.8* 34.8* 33.0* 32.1*  INR 2.76* 3.74* 3.48* 3.35*  CREATININE 1.32* 1.07 1.05  --     Estimated Creatinine Clearance: 30 ml/min (by C-G formula based on Cr of 1.05).  Medical History: Past Medical History  Diagnosis Date  . ASCVD (arteriosclerotic cardiovascular disease)     acute MI in 1993;40%left main,80%LAD, 100%RCA-PTCA; EF of 40%;stress nuclear in2007; normal EF;  mixed inferolateral ischemia and infarction; CHF with normal EF in 2011  . CHF (congestive heart failure)     normal EF;mixed inferolateral ischemic and infarction;chf with normal EF in 2011  . Hypertension   . Hyperlipidemia   . Cerebrovascular disease     CVA by CT in 2007,but not noted in 2009;duplex in 12/2006 plaque without stenosis; Emergency Room evaluation in 12/2010 for transient aphasia and paresis-clopidogrel initially added to ASA Rx, but subsequently warfarin was substituted for these 2 agents  . Tobacco abuse, in remission     quit in 1995  . Claudication   . Dementia     mild  . Kyphosis     severe  . Decreased hearing   . Atrial fibrillation   . Chronic anticoagulation   . Chronic kidney disease   . TIA (transient ischemic attack)    Medications:   Prescriptions prior to admission  Medication Sig Dispense Refill  . albuterol (PROVENTIL) (2.5 MG/3ML) 0.083% nebulizer solution Take 2.5 mg by nebulization every 4 (four) hours as needed. For shortness of breath/cough      . albuterol-ipratropium (COMBIVENT) 18-103 MCG/ACT inhaler Inhale 2 puffs into the lungs every 6 (six) hours as needed. For shortness of breath      . alendronate (FOSAMAX) 70 MG tablet Take 70 mg by mouth every 7 (seven) days. Take with a full glass of water on an empty stomach.       . cetirizine (ZYRTEC) 10 MG tablet Take 10 mg by mouth at bedtime.       . Cholecalciferol (VITAMIN D) 1000 UNITS capsule Take 2,000 Units by mouth every morning.       . clobetasol cream (TEMOVATE) 0.05 % Apply 1 application topically once a week.      . Dietary Management Product (AXONA) packet Take 40 g by mouth daily. 1/2 packet  Given daily in the morning      . donepezil (ARICEPT) 10 MG tablet Take 10 mg by mouth every morning.      . furosemide (LASIX) 40 MG tablet Take 40 mg by mouth daily. *Given on an empty stomach 30 minutes before eating      . lisinopril (PRINIVIL,ZESTRIL) 2.5 MG tablet Take 2.5 mg  by mouth every morning.       Marland Kitchen LORazepam (ATIVAN) 0.5 MG tablet Take 0.5 mg by mouth 3 (three) times daily. Anxiety and or agitation. *Last dose is not to be given, if patient receives sleeping medication HALCION (TRIAZOLAM)*      . memantine (NAMENDA) 10 MG tablet Take 10 mg by mouth 2 (two) times daily.       . Multiple Vitamins-Minerals (MULTIVITAMIN WITH MINERALS) tablet Take 1 tablet by mouth every morning.       . nitroGLYCERIN (NITROSTAT) 0.4 MG SL tablet Place 0.4 mg under the tongue every 5 (five) minutes as needed. For chest pain       . Omega-3 Fatty Acids (FISH OIL) 1000 MG CAPS Take 1,200 mg by mouth 2 (two) times daily.       . polyethylene glycol (MIRALAX / GLYCOLAX) packet Take 17 g by mouth daily as needed. constip      . potassium chloride SA (K-DUR,KLOR-CON) 20 MEQ  tablet Take 20 mEq by mouth every morning.       . pravastatin (PRAVACHOL) 40 MG tablet Take 1 tablet (40 mg total) by mouth at bedtime.  30 tablet  10  . Salicylic Acid (SALEX) 6 % SHAM Apply 1 application topically once a week. For psoriasis      . triazolam (HALCION) 0.125 MG tablet Take 0.125 mg by mouth at bedtime as needed (*IF THIS MEDICATION IS GIVEN, PATIENT DOES NOT TAKE NIGHT DOSE OF ATIVAN (LORAZEPAM)*). For sleep      . vitamin C (ASCORBIC ACID) 500 MG tablet Take 500 mg by mouth every morning.       . warfarin (COUMADIN) 2.5 MG tablet Take 2.5 mg by mouth every evening.        Assessment: 79yo female maintained on chronic Coumadin for afib.  Home dose is listed above (2.5mg  daily).  INR was therapeutic on admission but has now trended up to supra-therapeutic range, most likely due to interacting meds and acute illness.  Zithromax has been ordered which may interact with warfarin.    Goal of Therapy:  INR 2-3   Plan:  HOLD coumadin today INR daily  Mady Gemma 02/09/2013,9:01 AM  PHARMACIST - PHYSICIAN COMMUNICATION DR:   TR[facility] CONCERNING: Antibiotic IV to Oral Route Change Policy  RECOMMENDATION: This patient is receiving Zithromax by the intravenous route.  Based on criteria approved by the Pharmacy and Therapeutics Committee, the antibiotic(s) is/are being converted to the equivalent oral dose form(s).   DESCRIPTION: These criteria include:  Patient being treated for a respiratory tract infection, urinary tract infection, or cellulitis  The patient is not neutropenic and does not exhibit a GI malabsorption state  The patient is eating (either orally or via tube) and/or has been taking other orally administered medications for a least 24 hours  The patient is improving clinically and has a Tmax < 100.5  If you have questions about this conversion, please contact the Pharmacy Department  [x]   [phone] )  [facility] []   [phone] )  [facility]  []   [phone] )  [facility] []   [phone] )  [facility]   Mady Gemma, [facility] 02/09/2013 9:05 AM

## 2013-02-11 LAB — CULTURE, BLOOD (ROUTINE X 2)

## 2013-02-18 ENCOUNTER — Ambulatory Visit (INDEPENDENT_AMBULATORY_CARE_PROVIDER_SITE_OTHER): Payer: PRIVATE HEALTH INSURANCE | Admitting: *Deleted

## 2013-02-18 DIAGNOSIS — I4891 Unspecified atrial fibrillation: Secondary | ICD-10-CM

## 2013-02-18 DIAGNOSIS — Z7901 Long term (current) use of anticoagulants: Secondary | ICD-10-CM

## 2013-02-27 ENCOUNTER — Emergency Department (HOSPITAL_COMMUNITY): Payer: PRIVATE HEALTH INSURANCE

## 2013-02-27 ENCOUNTER — Encounter (HOSPITAL_COMMUNITY): Payer: Self-pay

## 2013-02-27 ENCOUNTER — Emergency Department (HOSPITAL_COMMUNITY)
Admission: EM | Admit: 2013-02-27 | Discharge: 2013-02-27 | Disposition: A | Discharge status: Home / Self Care | Payer: PRIVATE HEALTH INSURANCE | Attending: Emergency Medicine | Admitting: Emergency Medicine

## 2013-02-27 DIAGNOSIS — Z7901 Long term (current) use of anticoagulants: Secondary | ICD-10-CM | POA: Insufficient documentation

## 2013-02-27 DIAGNOSIS — K2970 Gastritis, unspecified, without bleeding: Secondary | ICD-10-CM | POA: Insufficient documentation

## 2013-02-27 DIAGNOSIS — R110 Nausea: Secondary | ICD-10-CM | POA: Insufficient documentation

## 2013-02-27 DIAGNOSIS — K2990 Gastroduodenitis, unspecified, without bleeding: Secondary | ICD-10-CM | POA: Insufficient documentation

## 2013-02-27 DIAGNOSIS — Z87448 Personal history of other diseases of urinary system: Secondary | ICD-10-CM | POA: Insufficient documentation

## 2013-02-27 DIAGNOSIS — I10 Essential (primary) hypertension: Secondary | ICD-10-CM | POA: Insufficient documentation

## 2013-02-27 DIAGNOSIS — F0390 Unspecified dementia without behavioral disturbance: Secondary | ICD-10-CM | POA: Insufficient documentation

## 2013-02-27 DIAGNOSIS — Z8673 Personal history of transient ischemic attack (TIA), and cerebral infarction without residual deficits: Secondary | ICD-10-CM | POA: Insufficient documentation

## 2013-02-27 DIAGNOSIS — Z8719 Personal history of other diseases of the digestive system: Secondary | ICD-10-CM | POA: Insufficient documentation

## 2013-02-27 DIAGNOSIS — Z79899 Other long term (current) drug therapy: Secondary | ICD-10-CM | POA: Insufficient documentation

## 2013-02-27 DIAGNOSIS — E785 Hyperlipidemia, unspecified: Secondary | ICD-10-CM | POA: Insufficient documentation

## 2013-02-27 DIAGNOSIS — Z90710 Acquired absence of both cervix and uterus: Secondary | ICD-10-CM | POA: Insufficient documentation

## 2013-02-27 DIAGNOSIS — I509 Heart failure, unspecified: Secondary | ICD-10-CM | POA: Insufficient documentation

## 2013-02-27 DIAGNOSIS — Z8739 Personal history of other diseases of the musculoskeletal system and connective tissue: Secondary | ICD-10-CM | POA: Insufficient documentation

## 2013-02-27 DIAGNOSIS — Z8679 Personal history of other diseases of the circulatory system: Secondary | ICD-10-CM | POA: Insufficient documentation

## 2013-02-27 DIAGNOSIS — Z8669 Personal history of other diseases of the nervous system and sense organs: Secondary | ICD-10-CM | POA: Insufficient documentation

## 2013-02-27 DIAGNOSIS — N289 Disorder of kidney and ureter, unspecified: Secondary | ICD-10-CM | POA: Insufficient documentation

## 2013-02-27 DIAGNOSIS — Z87891 Personal history of nicotine dependence: Secondary | ICD-10-CM | POA: Insufficient documentation

## 2013-02-27 HISTORY — DX: Disorder of kidney and ureter, unspecified: N28.9

## 2013-02-27 LAB — CBC WITH DIFFERENTIAL/PLATELET
Basophils Absolute: 0.1 10*3/uL (ref 0.0–0.1)
Basophils Relative: 1 % (ref 0–1)
Eosinophils Absolute: 0.2 10*3/uL (ref 0.0–0.7)
Eosinophils Relative: 1 % (ref 0–5)
HCT: 41.4 % (ref 36.0–46.0)
Hemoglobin: 13.2 g/dL (ref 12.0–15.0)
Lymphocytes Relative: 14 % (ref 12–46)
Lymphs Abs: 1.7 10*3/uL (ref 0.7–4.0)
MCH: 29.7 pg (ref 26.0–34.0)
MCHC: 31.9 g/dL (ref 30.0–36.0)
MCV: 93 fL (ref 78.0–100.0)
Monocytes Absolute: 1.3 10*3/uL — ABNORMAL HIGH (ref 0.1–1.0)
Monocytes Relative: 11 % (ref 3–12)
Neutro Abs: 9 10*3/uL — ABNORMAL HIGH (ref 1.7–7.7)
Neutrophils Relative %: 74 % (ref 43–77)
Platelets: 250 10*3/uL (ref 150–400)
RBC: 4.45 MIL/uL (ref 3.87–5.11)
RDW: 14.1 % (ref 11.5–15.5)
WBC: 12.2 10*3/uL — ABNORMAL HIGH (ref 4.0–10.5)

## 2013-02-27 LAB — COMPREHENSIVE METABOLIC PANEL
ALT: 6 U/L (ref 0–35)
AST: 13 U/L (ref 0–37)
Albumin: 3 g/dL — ABNORMAL LOW (ref 3.5–5.2)
Alkaline Phosphatase: 86 U/L (ref 39–117)
BUN: 18 mg/dL (ref 6–23)
CO2: 34 mEq/L — ABNORMAL HIGH (ref 19–32)
Calcium: 9.1 mg/dL (ref 8.4–10.5)
Chloride: 99 mEq/L (ref 96–112)
Creatinine, Ser: 1.24 mg/dL — ABNORMAL HIGH (ref 0.50–1.10)
GFR calc Af Amer: 43 mL/min — ABNORMAL LOW (ref >90)
GFR calc non Af Amer: 37 mL/min — ABNORMAL LOW (ref >90)
Glucose, Bld: 108 mg/dL — ABNORMAL HIGH (ref 70–99)
Potassium: 4.2 mEq/L (ref 3.5–5.1)
Sodium: 138 mEq/L (ref 135–145)
Total Bilirubin: 0.3 mg/dL (ref 0.3–1.2)
Total Protein: 6.2 g/dL (ref 6.0–8.3)

## 2013-02-27 LAB — URINALYSIS, ROUTINE W REFLEX MICROSCOPIC
Bilirubin Urine: NEGATIVE
Glucose, UA: NEGATIVE mg/dL
Ketones, ur: NEGATIVE mg/dL
Nitrite: NEGATIVE
Protein, ur: 100 mg/dL — AB
Specific Gravity, Urine: 1.015 (ref 1.005–1.030)
Urobilinogen, UA: 0.2 mg/dL (ref 0.0–1.0)
pH: 8 (ref 5.0–8.0)

## 2013-02-27 LAB — URINE MICROSCOPIC-ADD ON

## 2013-02-27 LAB — LIPASE, BLOOD: Lipase: 52 U/L (ref 11–59)

## 2013-02-27 MED ORDER — FAMOTIDINE 20 MG PO TABS: 20.0000 mg | ORAL_TABLET | Freq: Two times a day (BID) | ORAL | Status: DC

## 2013-02-27 MED ORDER — FAMOTIDINE 20 MG PO TABS
20.0000 mg | ORAL_TABLET | Freq: Once | ORAL | Status: AC
  Administered 2013-02-27: 20 mg via ORAL
  Filled 2013-02-27: qty 1

## 2013-02-27 NOTE — ED Notes (Addendum)
EMS reports pt has been having epigastric pain that is worse after eating and when lays down.  Reports tried to get in to see pcp but can't get an appt until June 6.  Pt alert, disoriented, but ems reports is pt's baseline.    Pt presently denies any pain.

## 2013-02-27 NOTE — ED Notes (Signed)
Family member walked to [employer]

## 2013-02-27 NOTE — ED Notes (Signed)
Family at bedside to provide more in depth pt hx. Per pt family, pt was d/c from the hospital x few weeks ago with a dx of pneumonia. Pt family reports pt health improved and then this week pt has reported "feeling sick on her stomach." pt family reports pt has vomitted x1 this past week. Pt family reports pt this am "grabbed her chest and reported it hurt when swallowing." pt family reports pt has "chest discomfort" in the mornings and late at night. Pt family reports "discomfort" improves midday after eating. Pt family also reports pt has had frequent uti's in the past and would like a urinalysis to be done.

## 2013-02-27 NOTE — ED Provider Notes (Signed)
History  This chart was scribed for Osvaldo Human, MD, MD by Smitty Pluck, ED Scribe. The patient was seen in room APA05/APA05 and the patient's care was started at 1:15 PM.   CSN: [id number]  Arrival date & time 02/27/13  1108  Chief Complaint  Patient presents with  . Abdominal Pain   The history is provided by a relative. No language interpreter was used.    HPI Comments: Angela [name] is a 79 y.o. female who presents to the Emergency Department complaining of gradual-onset, moderate, intermittent epigastric pain which has been occurring for the past week. She reports that the pain is worsened upon eating or lying down, but that at the moment the pain is currently resolved. The pt's son states that she has experienced nausea as an associated symptom. He states that she is not eating or drinking as well as she normally does. He denies that she has experienced emesis. The son states that she has not taken any medication to mitigate her epigastric pain. The pt has a h/o of abdominal hysterectomy and appendectomy. She also has a h/o ASCVD, CHF, and HTN. The pt is a former smoker, and she denies drinking.   Past Medical History  Diagnosis Date  . ASCVD (arteriosclerotic cardiovascular disease)     acute MI in 1993;40%left main,80%LAD, 100%RCA-PTCA; EF of 40%;stress nuclear in2007; normal EF;  mixed inferolateral ischemia and infarction; CHF with normal EF in 2011  . CHF (congestive heart failure)     normal EF;mixed inferolateral ischemic and infarction;chf with normal EF in 2011  . Hypertension   . Hyperlipidemia   . Cerebrovascular disease     CVA by CT in 2007,but not noted in 2009;duplex in 12/2006 plaque without stenosis; Emergency Room evaluation in 12/2010 for transient aphasia and paresis-clopidogrel initially added to ASA Rx, but subsequently warfarin was substituted for these 2 agents  . Tobacco abuse, in remission     quit in 1995  . Claudication   . Dementia     mild  .  Kyphosis     severe  . Decreased hearing   . Atrial fibrillation   . Chronic anticoagulation   . TIA (transient ischemic attack)   . Renal disorder     Past Surgical History  Procedure Laterality Date  . Abdominal hysterectomy    . Appendectomy    . Tonsillectomy    . Enucleation      Right-resulted from infection    Family History  Problem Relation Age of Onset  . Dementia Mother   . Atrial fibrillation Son   . Hypertension Son     History  Substance Use Topics  . Smoking status: Former Smoker    Quit date: 01/05/1994  . Smokeless tobacco: Never Used  . Alcohol Use: No    No ob history provided.  Review of Systems A complete 10 system review of systems was obtained and all systems are negative except as noted in the HPI and PMH.   Allergies  Haldol; Sulfa antibiotics; and Sulfonamide derivatives  Home Medications   Current Outpatient Rx  Name  Route  Sig  Dispense  Refill  . albuterol (PROVENTIL) (2.5 MG/3ML) 0.083% nebulizer solution   Nebulization   Take 2.5 mg by nebulization every 4 (four) hours as needed. For shortness of breath/cough         . albuterol-ipratropium (COMBIVENT) 18-103 MCG/ACT inhaler   Inhalation   Inhale 2 puffs into the lungs every 6 (six) hours as needed.  For shortness of breath         . cetirizine (ZYRTEC) 10 MG tablet   Oral   Take 10 mg by mouth at bedtime.          . Cholecalciferol (VITAMIN D) 1000 UNITS capsule   Oral   Take 2,000 Units by mouth every morning.          . clobetasol cream (TEMOVATE) 0.05 %   Topical   Apply 1 application topically once a week.         . Dietary Management Product (AXONA) packet   Oral   Take 40 g by mouth daily. 1/2 packet  Given daily in the morning         . donepezil (ARICEPT) 10 MG tablet   Oral   Take 10 mg by mouth every morning.         Marland Kitchen lisinopril (PRINIVIL,ZESTRIL) 2.5 MG tablet   Oral   Take 2.5 mg by mouth every morning.          Marland Kitchen LORazepam  (ATIVAN) 0.5 MG tablet   Oral   Take 0.5 mg by mouth 3 (three) times daily. Anxiety and or agitation. *Last dose is not to be given, if patient receives sleeping medication HALCION (TRIAZOLAM)*         . memantine (NAMENDA) 10 MG tablet   Oral   Take 10 mg by mouth 2 (two) times daily.          . Multiple Vitamins-Minerals (MULTIVITAMIN WITH MINERALS) tablet   Oral   Take 1 tablet by mouth every morning.          . Omega-3 Fatty Acids (FISH OIL) 1000 MG CAPS   Oral   Take 1,200 mg by mouth 2 (two) times daily.          . polyethylene glycol (MIRALAX / GLYCOLAX) packet   Oral   Take 17 g by mouth daily as needed. constip         . pravastatin (PRAVACHOL) 40 MG tablet   Oral   Take 1 tablet (40 mg total) by mouth at bedtime.   30 tablet   10   . vitamin C (ASCORBIC ACID) 500 MG tablet   Oral   Take 500 mg by mouth every morning.          . warfarin (COUMADIN) 2.5 MG tablet   Oral   Take 2.5 mg by mouth every evening.          Marland Kitchen alendronate (FOSAMAX) 70 MG tablet   Oral   Take 70 mg by mouth every 7 (seven) days. Take with a full glass of water on an empty stomach.          Marland Kitchen guaiFENesin-dextromethorphan (ROBITUSSIN DM) 100-10 MG/5ML syrup   Oral   Take 5 mLs by mouth every 4 (four) hours as needed for cough.   118 mL   0   . nitroGLYCERIN (NITROSTAT) 0.4 MG SL tablet   Sublingual   Place 0.4 mg under the tongue every 5 (five) minutes as needed. For chest pain          . Salicylic Acid (SALEX) 6 % SHAM   Apply externally   Apply 1 application topically once a week. For psoriasis         . triazolam (HALCION) 0.125 MG tablet   Oral   Take 0.125 mg by mouth at bedtime as needed (*IF THIS MEDICATION IS GIVEN, PATIENT DOES NOT TAKE NIGHT  DOSE OF ATIVAN (LORAZEPAM)*). For sleep           Triage Vitals: BP 90/61  Pulse 65  Resp 18  SpO2 100%  Physical Exam  Nursing note and vitals reviewed. Constitutional: She appears well-developed.   Frail, thin, elderly woman no distress at rest.  HENT:  Head: Normocephalic and atraumatic.  Mouth/Throat: Oropharynx is clear and moist.  Eyes: Conjunctivae are normal.  Right prosthetic eye  Neck: Normal range of motion. Neck supple.  Cardiovascular: Normal rate, regular rhythm and normal heart sounds.   Pulmonary/Chest: Effort normal and breath sounds normal.  Abdominal: Soft. There is no tenderness.  Musculoskeletal: Normal range of motion. She exhibits no edema.  Neurological: She is alert.  Skin: Skin is warm.    ED Course  Procedures (including critical care time)  DIAGNOSTIC STUDIES: Oxygen Saturation is 100% on room air, normal  by my interpretation.    COORDINATION OF CARE:  1:09 PM- Discussed ED treatment, which includes x-rays and pain medication with pt, and pt agrees.   1:10 PM  Date: 02/27/2013  Rate: 96  Rhythm: atrial fibrillation and premature ventricular contractions (PVC) in a bigeminal pattern at times.  QRS Axis: normal  Intervals: QT prolonged  ST/T Wave abnormalities: normal  Conduction Disutrbances:right bundle branch block  Narrative Interpretation: Abnormal EKG  Old EKG Reviewed: unchanged  Results for orders placed during the hospital encounter of 02/27/13  COMPREHENSIVE METABOLIC PANEL      Result Value Range   Sodium 138  135 - 145 mEq/L   Potassium 4.2  3.5 - 5.1 mEq/L   Chloride 99  96 - 112 mEq/L   CO2 34 (*) 19 - 32 mEq/L   Glucose, Bld 108 (*) 70 - 99 mg/dL   BUN 18  6 - 23 mg/dL   Creatinine, Ser 1.61 (*) 0.50 - 1.10 mg/dL   Calcium 9.1  8.4 - 09.6 mg/dL   Total Protein 6.2  6.0 - 8.3 g/dL   Albumin 3.0 (*) 3.5 - 5.2 g/dL   AST 13  0 - 37 U/L   ALT 6  0 - 35 U/L   Alkaline Phosphatase 86  39 - 117 U/L   Total Bilirubin 0.3  0.3 - 1.2 mg/dL   GFR calc non Af Amer 37 (*) >90 mL/min   GFR calc Af Amer 43 (*) >90 mL/min  LIPASE, BLOOD      Result Value Range   Lipase 52  11 - 59 U/L  CBC WITH DIFFERENTIAL      Result Value  Range   WBC 12.2 (*) 4.0 - 10.5 K/uL   RBC 4.45  3.87 - 5.11 MIL/uL   Hemoglobin 13.2  12.0 - 15.0 g/dL   HCT 04.5  40.9 - 81.1 %   MCV 93.0  78.0 - 100.0 fL   MCH 29.7  26.0 - 34.0 pg   MCHC 31.9  30.0 - 36.0 g/dL   RDW 91.4  78.2 - 95.6 %   Platelets 250  150 - 400 K/uL   Neutrophils Relative % 74  43 - 77 %   Neutro Abs 9.0 (*) 1.7 - 7.7 K/uL   Lymphocytes Relative 14  12 - 46 %   Lymphs Abs 1.7  0.7 - 4.0 K/uL   Monocytes Relative 11  3 - 12 %   Monocytes Absolute 1.3 (*) 0.1 - 1.0 K/uL   Eosinophils Relative 1  0 - 5 %   Eosinophils Absolute 0.2  0.0 -  0.7 K/uL   Basophils Relative 1  0 - 1 %   Basophils Absolute 0.1  0.0 - 0.1 K/uL  URINALYSIS, ROUTINE W REFLEX MICROSCOPIC      Result Value Range   Color, Urine YELLOW  YELLOW   APPearance HAZY (*) CLEAR   Specific Gravity, Urine 1.015  1.005 - 1.030   pH 8.0  5.0 - 8.0   Glucose, UA NEGATIVE  NEGATIVE mg/dL   Hgb urine dipstick TRACE (*) NEGATIVE   Bilirubin Urine NEGATIVE  NEGATIVE   Ketones, ur NEGATIVE  NEGATIVE mg/dL   Protein, ur 454 (*) NEGATIVE mg/dL   Urobilinogen, UA 0.2  0.0 - 1.0 mg/dL   Nitrite NEGATIVE  NEGATIVE   Leukocytes, UA LARGE (*) NEGATIVE  URINE MICROSCOPIC-ADD ON      Result Value Range   Squamous Epithelial / LPF FEW (*) RARE   WBC, UA 7-10  <3 WBC/hpf   RBC / HPF 0-2  <3 RBC/hpf   Bacteria, UA RARE  RARE    2:46 PM- Rechecked pt. Pt states that her pain has been relieved due to the prescribed medication. Informed pt that her workup was good, that she tolerated the medication, and that she will released home.       1. Gastritis     I personally performed the services described in this documentation, which was scribed in my presence. The recorded information has been reviewed and is accurate.  Osvaldo Human, MD        Carleene Cooper III, MD 02/27/13 [phone]

## 2013-03-18 ENCOUNTER — Other Ambulatory Visit: Payer: Self-pay | Admitting: Adult Health

## 2013-03-18 ENCOUNTER — Ambulatory Visit (INDEPENDENT_AMBULATORY_CARE_PROVIDER_SITE_OTHER): Payer: PRIVATE HEALTH INSURANCE | Admitting: *Deleted

## 2013-03-18 DIAGNOSIS — Z7901 Long term (current) use of anticoagulants: Secondary | ICD-10-CM

## 2013-03-18 DIAGNOSIS — I4891 Unspecified atrial fibrillation: Secondary | ICD-10-CM

## 2013-03-18 LAB — POCT INR: INR: 3.6

## 2013-03-19 ENCOUNTER — Other Ambulatory Visit: Payer: Self-pay | Admitting: Neurology

## 2013-04-08 ENCOUNTER — Ambulatory Visit (INDEPENDENT_AMBULATORY_CARE_PROVIDER_SITE_OTHER): Payer: PRIVATE HEALTH INSURANCE | Admitting: *Deleted

## 2013-04-08 DIAGNOSIS — Z7901 Long term (current) use of anticoagulants: Secondary | ICD-10-CM

## 2013-04-08 DIAGNOSIS — I4891 Unspecified atrial fibrillation: Secondary | ICD-10-CM

## 2013-04-23 ENCOUNTER — Other Ambulatory Visit: Payer: Self-pay | Admitting: Neurology

## 2013-04-27 ENCOUNTER — Other Ambulatory Visit: Payer: Self-pay | Admitting: Cardiology

## 2013-04-29 NOTE — Telephone Encounter (Signed)
Medication sent via escribe.  

## 2013-05-06 ENCOUNTER — Ambulatory Visit (INDEPENDENT_AMBULATORY_CARE_PROVIDER_SITE_OTHER): Payer: PRIVATE HEALTH INSURANCE | Admitting: *Deleted

## 2013-05-06 DIAGNOSIS — I4891 Unspecified atrial fibrillation: Secondary | ICD-10-CM

## 2013-05-06 DIAGNOSIS — Z7901 Long term (current) use of anticoagulants: Secondary | ICD-10-CM

## 2013-05-06 LAB — POCT INR: INR: 2.9

## 2013-05-08 ENCOUNTER — Ambulatory Visit: Payer: PRIVATE HEALTH INSURANCE | Admitting: Cardiovascular Disease

## 2013-05-20 ENCOUNTER — Other Ambulatory Visit: Payer: Self-pay | Admitting: Neurology

## 2013-05-29 ENCOUNTER — Ambulatory Visit (HOSPITAL_COMMUNITY)
Admission: RE | Admit: 2013-05-29 | Discharge: 2013-05-29 | Disposition: A | Discharge status: Home / Self Care | Payer: PRIVATE HEALTH INSURANCE | Source: Ambulatory Visit | Attending: Family Medicine | Admitting: Family Medicine

## 2013-05-29 ENCOUNTER — Other Ambulatory Visit (HOSPITAL_COMMUNITY): Payer: Self-pay | Admitting: Family Medicine

## 2013-05-29 DIAGNOSIS — M25572 Pain in left ankle and joints of left foot: Secondary | ICD-10-CM

## 2013-05-29 DIAGNOSIS — M25579 Pain in unspecified ankle and joints of unspecified foot: Secondary | ICD-10-CM | POA: Insufficient documentation

## 2013-06-05 ENCOUNTER — Ambulatory Visit (INDEPENDENT_AMBULATORY_CARE_PROVIDER_SITE_OTHER): Payer: PRIVATE HEALTH INSURANCE | Admitting: *Deleted

## 2013-06-05 DIAGNOSIS — I4891 Unspecified atrial fibrillation: Secondary | ICD-10-CM

## 2013-06-05 DIAGNOSIS — Z7901 Long term (current) use of anticoagulants: Secondary | ICD-10-CM

## 2013-06-10 ENCOUNTER — Other Ambulatory Visit: Payer: Self-pay | Admitting: Neurology

## 2013-06-12 ENCOUNTER — Ambulatory Visit (INDEPENDENT_AMBULATORY_CARE_PROVIDER_SITE_OTHER): Payer: PRIVATE HEALTH INSURANCE | Admitting: Cardiovascular Disease

## 2013-06-12 ENCOUNTER — Encounter: Payer: Self-pay | Admitting: Cardiovascular Disease

## 2013-06-12 VITALS — BP 112/80 | HR 82 | Ht 65.0 in | Wt 121.0 lb

## 2013-06-12 DIAGNOSIS — I5032 Chronic diastolic (congestive) heart failure: Secondary | ICD-10-CM

## 2013-06-12 DIAGNOSIS — I7090 Unspecified atherosclerosis: Secondary | ICD-10-CM

## 2013-06-12 DIAGNOSIS — I10 Essential (primary) hypertension: Secondary | ICD-10-CM

## 2013-06-12 DIAGNOSIS — Z7901 Long term (current) use of anticoagulants: Secondary | ICD-10-CM

## 2013-06-12 DIAGNOSIS — I2510 Atherosclerotic heart disease of native coronary artery without angina pectoris: Secondary | ICD-10-CM

## 2013-06-12 DIAGNOSIS — I4891 Unspecified atrial fibrillation: Secondary | ICD-10-CM

## 2013-06-12 NOTE — Patient Instructions (Addendum)
Your physician recommends that you schedule a follow-up appointment in: 6 MONTHS 

## 2013-06-12 NOTE — Progress Notes (Signed)
Patient ID: Angela [name], female   DOB: Feb 10, 1921, 79 y.o.   MRN: [id number]   SUBJECTIVE: Angela [name] has a h/o permanent atrial fibrillation, CAD, HTN, hyperlipidemia, and chronic diastolic heart failure. She is maintained on Warfarin and has had a prior CVA. She recently had a fall at home due to balance problems. She did not lose consciousness, nor did she experience any bleeding complications from Warfarin. She denies chest pain, palpitations, and shortness of breath. She takes Lasix prn and has been doing well and not required any in the last several weeks, and only takes it for peri-ankle swelling.  She is here with her son.   Allergies  Allergen Reactions  . Haldol [Haloperidol Decanoate]     Over sedation  . Sulfa Antibiotics Other (See Comments)    uknown  . Sulfonamide Derivatives Other (See Comments)    unknown    Current Outpatient Prescriptions  Medication Sig Dispense Refill  . albuterol (PROVENTIL) (2.5 MG/3ML) 0.083% nebulizer solution Take 2.5 mg by nebulization every 4 (four) hours as needed. For shortness of breath/cough      . albuterol-ipratropium (COMBIVENT) 18-103 MCG/ACT inhaler Inhale 2 puffs into the lungs every 6 (six) hours as needed. For shortness of breath      . alendronate (FOSAMAX) 70 MG tablet Take 70 mg by mouth every 7 (seven) days. Take with a full glass of water on an empty stomach.       . cetirizine (ZYRTEC) 10 MG tablet Take 10 mg by mouth at bedtime.       . Cholecalciferol (VITAMIN D) 1000 UNITS capsule Take 2,000 Units by mouth every morning.       . Cyanocobalamin (VITAMIN B-12) 5000 MCG SUBL Place 5,000 mcg under the tongue daily.      Marland Kitchen donepezil (ARICEPT) 5 MG tablet TAKE 2 TABLETS EVERY DAY  30 tablet  0  . dronabinol (MARINOL) 2.5 MG capsule Take 2.5 mg by mouth 2 (two) times daily before a meal.      . furosemide (LASIX) 40 MG tablet Take 40 mg by mouth daily.      Marland Kitchen guaiFENesin-dextromethorphan (ROBITUSSIN DM) 100-10 MG/5ML syrup Take  5 mLs by mouth every 4 (four) hours as needed for cough.  118 mL  0  . lisinopril (PRINIVIL,ZESTRIL) 2.5 MG tablet Take 2.5 mg by mouth every morning.       Marland Kitchen LORazepam (ATIVAN) 0.5 MG tablet Take 0.5 mg by mouth 3 (three) times daily. Anxiety and or agitation. *Last dose is not to be given, if patient receives sleeping medication HALCION (TRIAZOLAM)*      . memantine (NAMENDA) 10 MG tablet Take 10 mg by mouth 2 (two) times daily.       . Multiple Vitamins-Minerals (MULTIVITAMIN WITH MINERALS) tablet Take 1 tablet by mouth every morning.       . nitroGLYCERIN (NITROSTAT) 0.4 MG SL tablet Place 0.4 mg under the tongue every 5 (five) minutes as needed. For chest pain       . omeprazole (PRILOSEC) 40 MG capsule Take 40 mg by mouth daily.      . polyethylene glycol (MIRALAX / GLYCOLAX) packet Take 17 g by mouth daily as needed. constip      . potassium chloride SA (K-DUR,KLOR-CON) 20 MEQ tablet Take 20 mEq by mouth daily.      . pravastatin (PRAVACHOL) 40 MG tablet TAKE 1 TABLET (40 MG TOTAL) BY MOUTH AT BEDTIME.  30 tablet  6  . risperiDONE (RISPERDAL)  0.5 MG tablet Take 0.5 mg by mouth daily.      . Salicylic Acid (SALEX) 6 % SHAM Apply 1 application topically once a week. For psoriasis      . triazolam (HALCION) 0.125 MG tablet Take 0.125 mg by mouth at bedtime as needed (*IF THIS MEDICATION IS GIVEN, PATIENT DOES NOT TAKE NIGHT DOSE OF ATIVAN (LORAZEPAM)*). For sleep      . vitamin C (ASCORBIC ACID) 500 MG tablet Take 500 mg by mouth every morning.       . warfarin (COUMADIN) 2.5 MG tablet Take 2.5 mg by mouth every evening.       . warfarin (COUMADIN) 2.5 MG tablet Take 1 tablet daily except 1/2 tablets on Sundays  30 tablet  3   No current facility-administered medications for this visit.    Past Medical History  Diagnosis Date  . ASCVD (arteriosclerotic cardiovascular disease)     acute MI in 1993;40%left main,80%LAD, 100%RCA-PTCA; EF of 40%;stress nuclear in2007; normal EF;  mixed  inferolateral ischemia and infarction; CHF with normal EF in 2011  . CHF (congestive heart failure)     normal EF;mixed inferolateral ischemic and infarction;chf with normal EF in 2011  . Hypertension   . Hyperlipidemia   . Cerebrovascular disease     CVA by CT in 2007,but not noted in 2009;duplex in 12/2006 plaque without stenosis; Emergency Room evaluation in 12/2010 for transient aphasia and paresis-clopidogrel initially added to ASA Rx, but subsequently warfarin was substituted for these 2 agents  . Tobacco abuse, in remission     quit in 1995  . Claudication   . Dementia     mild  . Kyphosis     severe  . Decreased hearing   . Atrial fibrillation   . Chronic anticoagulation   . TIA (transient ischemic attack)   . Renal disorder     Past Surgical History  Procedure Laterality Date  . Abdominal hysterectomy    . Appendectomy    . Tonsillectomy    . Enucleation      Right-resulted from infection    History   Social History  . Marital Status: Divorced    Spouse Name: N/A    Number of Children: 2  . Years of Education: N/A   Occupational History  . retired    Social History Main Topics  . Smoking status: Former Smoker    Quit date: 01/05/1994  . Smokeless tobacco: Never Used  . Alcohol Use: No  . Drug Use: No  . Sexual Activity: Yes    Birth Control/ Protection: Surgical   Other Topics Concern  . Not on file   Social History Narrative  . No narrative on file     Filed Vitals:   06/12/13 1428  BP: 112/80  Pulse: 82  Height: 5\' 5"  (1.651 m)  Weight: 121 lb (54.885 kg)  SpO2: 92%    PHYSICAL EXAM General: NAD Neck: No JVD, no thyromegaly or thyroid nodule.  Lungs: Clear to auscultation bilaterally with normal respiratory effort. CV: Nondisplaced PMI.  Heart irregular rhythm, normal S1/S2, no S3/S4, II/VI systolic murmur along left sternal border.  No peripheral edema.  No carotid bruit.  Normal pedal pulses.  Abdomen: Soft, nontender, no  hepatosplenomegaly, no distention.  Neurologic: Alert and oriented x 3.  Psych: Normal affect. Extremities: No clubbing or cyanosis.   ECG: reviewed and available in electronic records.      ASSESSMENT AND PLAN: 1. CAD: quiescent on current medical therapy. 2.  Permanent atrial fibrillation: rate-controlled and asymptomatic. She will continue with Warfarin therapy. 3. HTN: controlled. 4. Chronic diastolic heart failure: compensated with good BP control. Has not needed Lasix regularly.   Prentice Docker, M.D., F.A.C.C.

## 2013-07-03 ENCOUNTER — Encounter: Payer: Self-pay | Admitting: Orthopedic Surgery

## 2013-07-03 ENCOUNTER — Ambulatory Visit (INDEPENDENT_AMBULATORY_CARE_PROVIDER_SITE_OTHER): Payer: PRIVATE HEALTH INSURANCE | Admitting: Orthopedic Surgery

## 2013-07-03 VITALS — BP 122/72 | Ht 60.0 in | Wt 121.0 lb

## 2013-07-03 DIAGNOSIS — S8290XS Unspecified fracture of unspecified lower leg, sequela: Secondary | ICD-10-CM

## 2013-07-03 DIAGNOSIS — S82892S Other fracture of left lower leg, sequela: Secondary | ICD-10-CM

## 2013-07-03 NOTE — Patient Instructions (Addendum)
Hardware removal left ankle  20680 CPT code  Surgery 07/26/13   Risk of infection  Stop warfarin 1 week before surgery

## 2013-07-03 NOTE — Progress Notes (Signed)
Patient ID: Angela [name], female   DOB: [birth year], 79 y.o.   MRN: [id number]  Chief Complaint  Patient presents with  . Foot Pain    Left foot pain. Referred by Dr. Sherwood Gambler    HISTORY:  40-year-old female status post open treatment internal fixation left ankle process with painful hardware in the medial side of the ankle and a developing skin issue. Complains of 2/10 dull pain intermittent associated with swelling and pain over the medial malleolus with shoe wear issues at this point  Review of systems heartburn frequency denies chest pain or shortness of breath she is on Coumadin complains of easy bruising she complains of heat and cold intolerance her remaining review of systems was complete and was negative  Allergy to sulfur drugs and haloperidol  History of pneumonia she's had 3 for many structures cardiac disease  She has a family history of heart disease  She is retired now she doesn't smoke or drink  Med list is recorded  The past, family history and social history have been reviewed and are recorded in the corresponding sections of epic   BP 122/72  Ht 5' (1.524 m)  Wt 121 lb (54.885 kg)  BMI 23.63 kg/m2  She is awake and alert she is oriented to person. She's noted to have kyphotic deformity upper thoracic spine her frame is small she is ambulatory  Upper extremity exam  The right and left upper extremity:   Inspection and palpation revealed no abnormalities in the upper extremities.   Range of motion is full without contracture.  Motor exam is normal with grade 5 strength.  The joints are fully reduced without subluxation.  There is no atrophy or tremor and muscle tone is normal.  All joints are stable.   The right lower extremity is well aligned without contracture subluxation atrophy tremor or skin changes other than related to chronic venous stasis disease Coumadin and paperthin skin no tenderness  She is medial and lateral scars over the ankle the foot  is plantigrade she has normal ankle range of motion with tenderness over the medial malleolus and a callus forming there. Ankle stability confirmed motor exam normal without atrophy skin otherwise normal other than the medial malleolus shows good dorsalis pedis pulse he has normal sensation in the foot  X-rays show prominent hardware medial malleolus lateral hardware normal  Impression ankle pain secondary to ankle fracture fixation  The patient is agreeable to removal of the medial pins  Stop Coumadin one week prior to surgery  Surgery scheduled hardware removal left ankle

## 2013-07-04 ENCOUNTER — Telehealth: Payer: Self-pay | Admitting: Orthopedic Surgery

## 2013-07-04 NOTE — Telephone Encounter (Signed)
Patient's daughters, Clyda Greener and Christy Gentles, both of whom have signed release form as designated parties to receive information, have called with questions regarding upcoming surgery, scheduled 07/26/13:  - concerns, questions regarding being off Coumedin x7 days due to patient's age and history of stroke  - type of anesthesia planned for the procedure?  - will she be admitted after surgery?    - will she be able to ambulate after surgery, on walker?  - concerns about surgery being scheduled on a Friday, and care over the weekend, including at the hospital, if she is admitted  - possible discharge to home or to a facility?  - possible complications?  Patient lives with son, with care-giver during the day at home; sisters state that their brother is concerned about caring for at home; sisters are trying to arrange availability; Dondra Spry is local; sister August Saucer lives in [place] (will be here for the surgery).  Sister Kathrine Cords ph# [phone]; sister Dean's ph# [phone].  Please advise.

## 2013-07-08 ENCOUNTER — Telehealth: Payer: Self-pay | Admitting: Orthopedic Surgery

## 2013-07-08 ENCOUNTER — Encounter: Payer: Self-pay | Admitting: *Deleted

## 2013-07-08 NOTE — Telephone Encounter (Signed)
Routing to Dr Harrison 

## 2013-07-08 NOTE — Telephone Encounter (Signed)
Faxed letter of medical clearance to cardiologist.

## 2013-07-08 NOTE — Telephone Encounter (Signed)
Angela [name]'s daughter, Clyda Greener asked if Dr. Romeo Apple will check with [facility] Cardiology doctor at [facility] in [place] ( Wasn't sure of the name) before her surgery. She is concerned about her mother being put to sleep.

## 2013-07-08 NOTE — Telephone Encounter (Signed)
Tammy Long handling this

## 2013-07-08 NOTE — Telephone Encounter (Signed)
Tammy check on this patient surgery date if it's this week postpone and sent her to the cardiologist for a preop visit

## 2013-07-11 ENCOUNTER — Ambulatory Visit (INDEPENDENT_AMBULATORY_CARE_PROVIDER_SITE_OTHER): Payer: PRIVATE HEALTH INSURANCE | Admitting: *Deleted

## 2013-07-11 DIAGNOSIS — Z7901 Long term (current) use of anticoagulants: Secondary | ICD-10-CM

## 2013-07-11 DIAGNOSIS — I4891 Unspecified atrial fibrillation: Secondary | ICD-10-CM

## 2013-07-17 ENCOUNTER — Telehealth: Payer: Self-pay | Admitting: *Deleted

## 2013-07-17 NOTE — Telephone Encounter (Signed)
Noted paperwork given by this nurse to Dr. Purvis Sheffield, advised he will address asap

## 2013-07-17 NOTE — Telephone Encounter (Signed)
Dr Romeo Apple office called today to check on surgical clearance for pt to have hardware removed from ankle. Letter is in epic for request and they stated they faxed it over on 07/08/13.   Not sure what happen but was never received, pt was just seen 06/12/13 by Dr Purvis Sheffield can we do clearance off last visit?

## 2013-07-18 NOTE — Telephone Encounter (Signed)
Noted incoming call from pt office to clarify if the MD has signed the paperwork, noted Dr. Purvis Sheffield in [place] office, this nurse manually faxed Dr. Purvis Sheffield to please advise per noted the pt surgeon has been calling the office daily for a response

## 2013-07-18 NOTE — Telephone Encounter (Signed)
Noted incoming notation from Dr. Purvis Sheffield to advise the pt is at moderate risk for major adverse cardiac event. Warfarin can be held but will need a Lovenox bridge due to h/o CVA, Vashti Hey has been notified verbally concerning lovenox bridge, this nurse contacted [facility] to advise the instructions have been given however a surgery date is required for lovenox bridge, Tammy LPN advised no date set at this time however they will call our office back with date for further instructions.

## 2013-07-19 ENCOUNTER — Inpatient Hospital Stay (HOSPITAL_COMMUNITY): Admission: RE | Admit: 2013-07-19 | Payer: PRIVATE HEALTH INSURANCE | Source: Ambulatory Visit

## 2013-07-26 ENCOUNTER — Ambulatory Visit (HOSPITAL_COMMUNITY)
Admission: RE | Admit: 2013-07-26 | Payer: PRIVATE HEALTH INSURANCE | Source: Ambulatory Visit | Admitting: Orthopedic Surgery

## 2013-07-26 ENCOUNTER — Encounter (HOSPITAL_COMMUNITY): Admission: RE | Payer: Self-pay | Source: Ambulatory Visit

## 2013-07-26 SURGERY — REMOVAL, HARDWARE
Anesthesia: Choice | Laterality: Left

## 2013-07-29 ENCOUNTER — Ambulatory Visit: Payer: PRIVATE HEALTH INSURANCE | Admitting: Orthopedic Surgery

## 2013-07-29 NOTE — Telephone Encounter (Signed)
I spoke with patient's daughter, Christy Gentles, and advised her that we had received cardiac clearance for surgery. She advised me that they are working through some other matters, and they would call back if they wanted to reschedule surgery. (Hardware Removal)

## 2013-07-29 NOTE — Telephone Encounter (Signed)
Noted pt surgery scheduled for 08-16-13, please advise lovenox bridge

## 2013-07-30 NOTE — Telephone Encounter (Signed)
Please advise pt/daughter instructions for lovenox injections,

## 2013-07-30 NOTE — Telephone Encounter (Signed)
Patient daughter called and orthopaedic physician doing surgery is Dr. Jodi Geralds, send attn Aldean Baker (fax [phone]) [address] in [place].  Dr. Romeo Apple office not doing surgery.   Dr. Luiz Blare office needs to be sent surgical clearance and instructions regarding coumadin.  Patient daughter has questions regarding shots that she will be taking when taken off coumadin.  I believe she is referencing Lovenox injections.

## 2013-08-01 ENCOUNTER — Other Ambulatory Visit: Payer: Self-pay | Admitting: Orthopedic Surgery

## 2013-08-01 NOTE — Telephone Encounter (Signed)
LMOM for daughter Waynetta Sandy.  Pt/family will be advised on Lovenox bridge at next INR appt 11/6.  Surgery scheduled for 11/14.  Daughter to call back with questions.

## 2013-08-08 ENCOUNTER — Telehealth: Payer: Self-pay | Admitting: *Deleted

## 2013-08-08 ENCOUNTER — Encounter (HOSPITAL_BASED_OUTPATIENT_CLINIC_OR_DEPARTMENT_OTHER): Payer: Self-pay | Admitting: *Deleted

## 2013-08-08 ENCOUNTER — Encounter (HOSPITAL_BASED_OUTPATIENT_CLINIC_OR_DEPARTMENT_OTHER)
Admission: RE | Admit: 2013-08-08 | Discharge: 2013-08-08 | Disposition: A | Discharge status: Home / Self Care | Payer: PRIVATE HEALTH INSURANCE | Source: Ambulatory Visit | Attending: Orthopedic Surgery | Admitting: Orthopedic Surgery

## 2013-08-08 ENCOUNTER — Encounter: Payer: Self-pay | Admitting: *Deleted

## 2013-08-08 DIAGNOSIS — Z01812 Encounter for preprocedural laboratory examination: Secondary | ICD-10-CM | POA: Insufficient documentation

## 2013-08-08 DIAGNOSIS — Z01818 Encounter for other preprocedural examination: Secondary | ICD-10-CM | POA: Insufficient documentation

## 2013-08-08 LAB — BASIC METABOLIC PANEL
BUN: 12 mg/dL (ref 6–23)
CO2: 29 mEq/L (ref 19–32)
Creatinine, Ser: 1.07 mg/dL (ref 0.50–1.10)
GFR calc non Af Amer: 44 mL/min — ABNORMAL LOW (ref >90)
Glucose, Bld: 93 mg/dL (ref 70–99)
Potassium: 4.2 mEq/L (ref 3.5–5.1)

## 2013-08-08 LAB — PROTIME-INR
INR: 2.23 — ABNORMAL HIGH (ref 0.00–1.49)
Prothrombin Time: 24 seconds — ABNORMAL HIGH (ref 11.6–15.2)

## 2013-08-08 NOTE — Progress Notes (Signed)
Son and pt showed up here-they were supposed to be at the [facility] cardiology coumadin clinic-for INR and teach lovenox inj for surgery Talked with Darl Pikes and Dr Ricard Dillon does not need to come off coumadin-she would not need lovenox. We did pt ptt inr and bmet Called coumadin clinic to tell them and they were glad since they did not think family was going to be able to do the inj. Dr Luiz Blare does not have a cut off for the INR for surgery

## 2013-08-08 NOTE — Telephone Encounter (Signed)
Angela [name] states Angela [name] and her son came to [facility] today instead of coumadin clinic here.  They did pt pre-op today and did PT/INR with other blood work.  We were under the impression coumadin would need to be held and pt bridged with Lovenox but [place] spoke with Dr Luiz Blare and coumadin does not need to be held. Procedure will be done under local with IV sedation and take about 30 minutes.  Angela [name] will f/u on INR results and report to Dr Luiz Blare.

## 2013-08-15 NOTE — Progress Notes (Signed)
Dr Gelene Mink and Gus Puma, PA aware of lab results. Ok to proceed with surgery

## 2013-08-16 ENCOUNTER — Encounter (HOSPITAL_BASED_OUTPATIENT_CLINIC_OR_DEPARTMENT_OTHER): Payer: Self-pay | Admitting: Anesthesiology

## 2013-08-16 ENCOUNTER — Encounter (HOSPITAL_BASED_OUTPATIENT_CLINIC_OR_DEPARTMENT_OTHER)
Admission: RE | Disposition: A | Discharge status: Home / Self Care | Payer: Self-pay | Source: Ambulatory Visit | Attending: Orthopedic Surgery

## 2013-08-16 ENCOUNTER — Ambulatory Visit (HOSPITAL_BASED_OUTPATIENT_CLINIC_OR_DEPARTMENT_OTHER)
Admission: RE | Admit: 2013-08-16 | Discharge: 2013-08-16 | Disposition: A | Discharge status: Home / Self Care | Payer: PRIVATE HEALTH INSURANCE | Source: Ambulatory Visit | Attending: Orthopedic Surgery | Admitting: Orthopedic Surgery

## 2013-08-16 ENCOUNTER — Encounter (HOSPITAL_BASED_OUTPATIENT_CLINIC_OR_DEPARTMENT_OTHER): Payer: PRIVATE HEALTH INSURANCE | Admitting: Anesthesiology

## 2013-08-16 ENCOUNTER — Ambulatory Visit (HOSPITAL_BASED_OUTPATIENT_CLINIC_OR_DEPARTMENT_OTHER): Payer: PRIVATE HEALTH INSURANCE | Admitting: Anesthesiology

## 2013-08-16 DIAGNOSIS — I509 Heart failure, unspecified: Secondary | ICD-10-CM | POA: Insufficient documentation

## 2013-08-16 DIAGNOSIS — Z8673 Personal history of transient ischemic attack (TIA), and cerebral infarction without residual deficits: Secondary | ICD-10-CM | POA: Insufficient documentation

## 2013-08-16 DIAGNOSIS — I4891 Unspecified atrial fibrillation: Secondary | ICD-10-CM | POA: Insufficient documentation

## 2013-08-16 DIAGNOSIS — T8489XA Other specified complication of internal orthopedic prosthetic devices, implants and grafts, initial encounter: Secondary | ICD-10-CM | POA: Insufficient documentation

## 2013-08-16 DIAGNOSIS — I10 Essential (primary) hypertension: Secondary | ICD-10-CM | POA: Insufficient documentation

## 2013-08-16 DIAGNOSIS — Y831 Surgical operation with implant of artificial internal device as the cause of abnormal reaction of the patient, or of later complication, without mention of misadventure at the time of the procedure: Secondary | ICD-10-CM | POA: Insufficient documentation

## 2013-08-16 DIAGNOSIS — I252 Old myocardial infarction: Secondary | ICD-10-CM | POA: Insufficient documentation

## 2013-08-16 DIAGNOSIS — Z87891 Personal history of nicotine dependence: Secondary | ICD-10-CM | POA: Insufficient documentation

## 2013-08-16 DIAGNOSIS — K219 Gastro-esophageal reflux disease without esophagitis: Secondary | ICD-10-CM | POA: Insufficient documentation

## 2013-08-16 DIAGNOSIS — Z7901 Long term (current) use of anticoagulants: Secondary | ICD-10-CM | POA: Insufficient documentation

## 2013-08-16 DIAGNOSIS — E785 Hyperlipidemia, unspecified: Secondary | ICD-10-CM | POA: Insufficient documentation

## 2013-08-16 DIAGNOSIS — M25579 Pain in unspecified ankle and joints of unspecified foot: Secondary | ICD-10-CM | POA: Insufficient documentation

## 2013-08-16 HISTORY — DX: Presence of spectacles and contact lenses: Z97.3

## 2013-08-16 HISTORY — PX: HARDWARE REMOVAL: SHX979

## 2013-08-16 HISTORY — DX: Presence of artificial eye: Z97.0

## 2013-08-16 HISTORY — DX: Gastro-esophageal reflux disease without esophagitis: K21.9

## 2013-08-16 HISTORY — DX: Unspecified hearing loss, unspecified ear: H91.90

## 2013-08-16 SURGERY — REMOVAL, HARDWARE
Anesthesia: Monitor Anesthesia Care | Site: Ankle | Laterality: Left | Wound class: Clean

## 2013-08-16 MED ORDER — FENTANYL CITRATE 0.05 MG/ML IJ SOLN
INTRAMUSCULAR | Status: AC
  Filled 2013-08-16: qty 2

## 2013-08-16 MED ORDER — FENTANYL CITRATE 0.05 MG/ML IJ SOLN
INTRAMUSCULAR | Status: DC | PRN
  Administered 2013-08-16: 50 ug via INTRAVENOUS

## 2013-08-16 MED ORDER — LIDOCAINE HCL (CARDIAC) 20 MG/ML IV SOLN
INTRAVENOUS | Status: DC | PRN
  Administered 2013-08-16: 20 mg via INTRAVENOUS

## 2013-08-16 MED ORDER — CEFAZOLIN SODIUM-DEXTROSE 2-3 GM-% IV SOLR
INTRAVENOUS | Status: AC
  Filled 2013-08-16: qty 50

## 2013-08-16 MED ORDER — LACTATED RINGERS IV SOLN
INTRAVENOUS | Status: DC
  Administered 2013-08-16: 08:00:00 via INTRAVENOUS

## 2013-08-16 MED ORDER — TRAMADOL HCL 50 MG PO TABS: 50.0000 mg | ORAL_TABLET | Freq: Four times a day (QID) | ORAL | Status: DC | PRN

## 2013-08-16 MED ORDER — FENTANYL CITRATE 0.05 MG/ML IJ SOLN: 50.0000 ug | INTRAMUSCULAR | Status: DC | PRN

## 2013-08-16 MED ORDER — CEFAZOLIN SODIUM-DEXTROSE 2-3 GM-% IV SOLR
2.0000 g | INTRAVENOUS | Status: AC
  Administered 2013-08-16: 2 g via INTRAVENOUS

## 2013-08-16 MED ORDER — MIDAZOLAM HCL 2 MG/2ML IJ SOLN: 1.0000 mg | INTRAMUSCULAR | Status: DC | PRN

## 2013-08-16 MED ORDER — LIDOCAINE HCL 2 % IJ SOLN
INTRAMUSCULAR | Status: AC
  Filled 2013-08-16: qty 20

## 2013-08-16 MED ORDER — FENTANYL CITRATE 0.05 MG/ML IJ SOLN: 25.0000 ug | INTRAMUSCULAR | Status: DC | PRN

## 2013-08-16 MED ORDER — POVIDONE-IODINE 7.5 % EX SOLN: Freq: Once | CUTANEOUS | Status: DC

## 2013-08-16 MED ORDER — PROPOFOL 10 MG/ML IV BOLUS
INTRAVENOUS | Status: DC | PRN
  Administered 2013-08-16 (×4): 10 mg via INTRAVENOUS

## 2013-08-16 MED ORDER — LIDOCAINE HCL 2 % IJ SOLN
INTRAMUSCULAR | Status: DC | PRN
  Administered 2013-08-16: 6 mL

## 2013-08-16 MED ORDER — BUPIVACAINE HCL (PF) 0.5 % IJ SOLN
INTRAMUSCULAR | Status: AC
  Filled 2013-08-16: qty 30

## 2013-08-16 MED ORDER — ONDANSETRON HCL 4 MG/2ML IJ SOLN: 4.0000 mg | Freq: Once | INTRAMUSCULAR | Status: DC | PRN

## 2013-08-16 SURGICAL SUPPLY — 52 items
BANDAGE ELASTIC 4 VELCRO ST LF (GAUZE/BANDAGES/DRESSINGS) ×2 IMPLANT
BANDAGE ESMARK 6X9 LF (GAUZE/BANDAGES/DRESSINGS) IMPLANT
BLADE SURG 15 STRL LF DISP TIS (BLADE) ×1 IMPLANT
BLADE SURG 15 STRL SS (BLADE) ×2
BNDG CMPR 9X4 STRL LF SNTH (GAUZE/BANDAGES/DRESSINGS)
BNDG CMPR 9X6 STRL LF SNTH (GAUZE/BANDAGES/DRESSINGS)
BNDG COHESIVE 4X5 TAN STRL (GAUZE/BANDAGES/DRESSINGS) IMPLANT
BNDG ESMARK 4X9 LF (GAUZE/BANDAGES/DRESSINGS) IMPLANT
BNDG ESMARK 6X9 LF (GAUZE/BANDAGES/DRESSINGS)
CANISTER SUCT 1200ML W/VALVE (MISCELLANEOUS) IMPLANT
CUFF TOURNIQUET SINGLE 18IN (TOURNIQUET CUFF) ×1 IMPLANT
DRAPE EXTREMITY T 121X128X90 (DRAPE) ×1 IMPLANT
DRAPE INCISE IOBAN 66X45 STRL (DRAPES) IMPLANT
DRAPE OEC MINIVIEW 54X84 (DRAPES) ×1 IMPLANT
DRAPE U 20/CS (DRAPES) IMPLANT
DRAPE U-SHAPE 47X51 STRL (DRAPES) ×2 IMPLANT
DRAPE U-SHAPE 76X120 STRL (DRAPES) ×1 IMPLANT
DRSG EMULSION OIL 3X3 NADH (GAUZE/BANDAGES/DRESSINGS) ×1 IMPLANT
DURAPREP 26ML APPLICATOR (WOUND CARE) ×2 IMPLANT
ELECT REM PT RETURN 9FT ADLT (ELECTROSURGICAL)
ELECTRODE REM PT RTRN 9FT ADLT (ELECTROSURGICAL) IMPLANT
GLOVE BIOGEL PI IND STRL 7.0 (GLOVE) IMPLANT
GLOVE BIOGEL PI IND STRL 8 (GLOVE) ×2 IMPLANT
GLOVE BIOGEL PI INDICATOR 7.0 (GLOVE) ×1
GLOVE BIOGEL PI INDICATOR 8 (GLOVE) ×1
GLOVE ECLIPSE 6.5 STRL STRAW (GLOVE) ×2 IMPLANT
GLOVE ECLIPSE 7.5 STRL STRAW (GLOVE) ×3 IMPLANT
GOWN BRE IMP PREV XXLGXLNG (GOWN DISPOSABLE) ×2 IMPLANT
GOWN PREVENTION PLUS XLARGE (GOWN DISPOSABLE) ×2 IMPLANT
GOWN PREVENTION PLUS XXLARGE (GOWN DISPOSABLE) ×1 IMPLANT
NEEDLE HYPO 22GX1.5 SAFETY (NEEDLE) ×1 IMPLANT
NS IRRIG 1000ML POUR BTL (IV SOLUTION) ×1 IMPLANT
PACK ARTHROSCOPY DSU (CUSTOM PROCEDURE TRAY) ×2 IMPLANT
PACK BASIN DAY SURGERY FS (CUSTOM PROCEDURE TRAY) ×2 IMPLANT
PAD CAST 4YDX4 CTTN HI CHSV (CAST SUPPLIES) IMPLANT
PADDING CAST ABS 4INX4YD NS (CAST SUPPLIES) ×1
PADDING CAST ABS COTTON 4X4 ST (CAST SUPPLIES) ×1 IMPLANT
PADDING CAST COTTON 4X4 STRL (CAST SUPPLIES) ×2
PENCIL BUTTON HOLSTER BLD 10FT (ELECTRODE) IMPLANT
SPONGE GAUZE 4X4 12PLY (GAUZE/BANDAGES/DRESSINGS) ×2 IMPLANT
SPONGE LAP 4X18 X RAY DECT (DISPOSABLE) IMPLANT
STOCKINETTE IMPERVIOUS LG (DRAPES) IMPLANT
STOCKINETTE TUBULAR 6 INCH (GAUZE/BANDAGES/DRESSINGS) IMPLANT
SUCTION FRAZIER TIP 10 FR DISP (SUCTIONS) IMPLANT
SUT ETHILON 3 0 PS 1 (SUTURE) ×1 IMPLANT
SUT ETHILON 4 0 PS 2 18 (SUTURE) IMPLANT
SUT VIC AB 2-0 SH 27 (SUTURE)
SUT VIC AB 2-0 SH 27XBRD (SUTURE) IMPLANT
SYR BULB 3OZ (MISCELLANEOUS) IMPLANT
SYR CONTROL 10ML LL (SYRINGE) ×1 IMPLANT
TOWEL OR NON WOVEN STRL DISP B (DISPOSABLE) ×2 IMPLANT
UNDERPAD 30X30 INCONTINENT (UNDERPADS AND DIAPERS) ×2 IMPLANT

## 2013-08-16 NOTE — Brief Op Note (Signed)
08/16/2013  10:17 AM  PATIENT:  Angela [name]  79 y.o. female  PRE-OPERATIVE DIAGNOSIS:  RETAINED PAINFUL HARDWARE LEFT ANKLE  POST-OPERATIVE DIAGNOSIS:  RETAINED PAINFUL HARDWARE LEFT ANKLE  PROCEDURE:  Procedure(s) with comments: REMOVAL 2 K WIRES FROM LEFT ANKLE.  (Left) - REMOVAL 2 K WIRES FROM LEFT ANKLE.    SURGEON:  Surgeon(s) and Role:    * Harvie Junior, MD - Primary  PHYSICIAN ASSISTANT:   ASSISTANTS: none  ANESTHESIA:   general  EBL:  Total I/O In: 300 [I.V.:300] Out: -   BLOOD ADMINISTERED:none  DRAINS: none   LOCAL MEDICATIONS USED:  LIDOCAINE   SPECIMEN:  No Specimen  DISPOSITION OF SPECIMEN:  N/A  COUNTS:  YES  TOURNIQUET:   Total Tourniquet Time Documented: Calf (Left) - 11 minutes Total: Calf (Left) - 11 minutes   DICTATION: .Other Dictation: Dictation Number [id number]  PLAN OF CARE: Discharge to home after PACU  PATIENT DISPOSITION:  PACU - hemodynamically stable.   Delay start of Pharmacological VTE agent (>24hrs) due to surgical blood loss or risk of bleeding: no

## 2013-08-16 NOTE — Transfer of Care (Signed)
Immediate Anesthesia Transfer of Care Note  Patient: Angela [name]  Procedure(s) Performed: Procedure(s) with comments: REMOVAL 2 K WIRES FROM LEFT ANKLE.  (Left) - REMOVAL 2 K WIRES FROM LEFT ANKLE.    Patient Location: PACU  Anesthesia Type:MAC  Level of Consciousness: awake, alert  and confused  Airway & Oxygen Therapy: Patient Spontanous Breathing  Post-op Assessment: Report given to PACU RN and Post -op Vital signs reviewed and stable  Post vital signs: Reviewed and stable  Complications: No apparent anesthesia complications

## 2013-08-16 NOTE — H&P (Signed)
PREOPERATIVE H&P  Chief Complaint: l ankle pain  HPI: Angela [name] is a 79 y.o. female who presents for evaluation of l ankle pain and retained hardware. It has been present for 5 years  and has been worsening. She has failed conservative measures. Pain is rated as moderate.  Past Medical History  Diagnosis Date  . ASCVD (arteriosclerotic cardiovascular disease)     acute MI in 1993;40%left main,80%LAD, 100%RCA-PTCA; EF of 40%;stress nuclear in2007; normal EF;  mixed inferolateral ischemia and infarction; CHF with normal EF in 2011  . CHF (congestive heart failure)     normal EF;mixed inferolateral ischemic and infarction;chf with normal EF in 2011  . Hypertension   . Hyperlipidemia   . Cerebrovascular disease     CVA by CT in 2007,but not noted in 2009;duplex in 12/2006 plaque without stenosis; Emergency Room evaluation in 12/2010 for transient aphasia and paresis-clopidogrel initially added to ASA Rx, but subsequently warfarin was substituted for these 2 agents  . Tobacco abuse, in remission     quit in 1995  . Claudication   . Dementia     mild  . Kyphosis     severe  . Decreased hearing   . Atrial fibrillation   . Chronic anticoagulation   . TIA (transient ischemic attack)   . Renal disorder   . Glass eye     right-1990  . HOH (hard of hearing)   . Wears glasses   . GERD (gastroesophageal reflux disease)    Past Surgical History  Procedure Laterality Date  . Abdominal hysterectomy    . Appendectomy    . Tonsillectomy    . Enucleation      Right-resulted from infection   History   Social History  . Marital Status: Divorced    Spouse Name: N/A    Number of Children: 2  . Years of Education: N/A   Occupational History  . retired    Social History Main Topics  . Smoking status: Former Smoker    Quit date: 01/05/1994  . Smokeless tobacco: Never Used  . Alcohol Use: No  . Drug Use: No  . Sexual Activity: Yes    Birth Control/ Protection: Surgical   Other  Topics Concern  . None   Social History Narrative  . None   Family History  Problem Relation Age of Onset  . Dementia Mother   . Atrial fibrillation Son   . Hypertension Son    Allergies  Allergen Reactions  . Haldol [Haloperidol Decanoate]     Over sedation  . Sulfa Antibiotics Other (See Comments)    uknown  . Sulfonamide Derivatives Other (See Comments)    unknown   Prior to Admission medications   Medication Sig Start Date End Date Taking? Authorizing Provider  albuterol-ipratropium (COMBIVENT) 18-103 MCG/ACT inhaler Inhale 2 puffs into the lungs every 6 (six) hours as needed. For shortness of breath   Yes Historical Provider, MD  alendronate (FOSAMAX) 70 MG tablet Take 70 mg by mouth every 7 (seven) days. Take with a full glass of water on an empty stomach.    Yes Historical Provider, MD  cetirizine (ZYRTEC) 10 MG tablet Take 10 mg by mouth at bedtime.    Yes Historical Provider, MD  Cholecalciferol (VITAMIN D) 1000 UNITS capsule Take 2,000 Units by mouth every morning.    Yes Historical Provider, MD  Cyanocobalamin (VITAMIN B-12) 5000 MCG SUBL Place 5,000 mcg under the tongue daily.   Yes Historical Provider, MD  donepezil (  ARICEPT) 5 MG tablet TAKE 2 TABLETS EVERY DAY 06/10/13  Yes Micki Riley, MD  dronabinol (MARINOL) 2.5 MG capsule Take 2.5 mg by mouth 2 (two) times daily before a meal.   Yes Historical Provider, MD  furosemide (LASIX) 40 MG tablet Take 40 mg by mouth daily.   Yes Historical Provider, MD  lisinopril (PRINIVIL,ZESTRIL) 2.5 MG tablet Take 2.5 mg by mouth every morning.    Yes Historical Provider, MD  LORazepam (ATIVAN) 0.5 MG tablet Take 0.5 mg by mouth 3 (three) times daily. Anxiety and or agitation. *Last dose is not to be given, if patient receives sleeping medication HALCION (TRIAZOLAM)*   Yes Historical Provider, MD  memantine (NAMENDA) 10 MG tablet Take 10 mg by mouth 2 (two) times daily.    Yes Historical Provider, MD  Multiple Vitamins-Minerals  (MULTIVITAMIN WITH MINERALS) tablet Take 1 tablet by mouth every morning.    Yes Historical Provider, MD  omeprazole (PRILOSEC) 40 MG capsule Take 40 mg by mouth daily.   Yes Historical Provider, MD  polyethylene glycol (MIRALAX / GLYCOLAX) packet Take 17 g by mouth daily as needed. constip   Yes Historical Provider, MD  potassium chloride SA (K-DUR,KLOR-CON) 20 MEQ tablet Take 20 mEq by mouth daily.   Yes Historical Provider, MD  pravastatin (PRAVACHOL) 40 MG tablet TAKE 1 TABLET (40 MG TOTAL) BY MOUTH AT BEDTIME. 03/18/13  Yes Jodelle Gross, NP  triazolam (HALCION) 0.125 MG tablet Take 0.125 mg by mouth at bedtime as needed (*IF THIS MEDICATION IS GIVEN, PATIENT DOES NOT TAKE NIGHT DOSE OF ATIVAN (LORAZEPAM)*). For sleep   Yes Historical Provider, MD  vitamin C (ASCORBIC ACID) 500 MG tablet Take 500 mg by mouth every morning.    Yes Historical Provider, MD  warfarin (COUMADIN) 2.5 MG tablet Take 2.5 mg by mouth every evening.  06/15/12  Yes Kathlen Brunswick, MD  warfarin (COUMADIN) 2.5 MG tablet Take 1 tablet daily except 1/2 tablets on Sundays 04/29/13  Yes Kathlen Brunswick, MD  albuterol (PROVENTIL) (2.5 MG/3ML) 0.083% nebulizer solution Take 2.5 mg by nebulization every 4 (four) hours as needed. For shortness of breath/cough 08/09/12   Historical Provider, MD  guaiFENesin-dextromethorphan (ROBITUSSIN DM) 100-10 MG/5ML syrup Take 5 mLs by mouth every 4 (four) hours as needed for cough. 02/09/13   Nimish Normajean Glasgow, MD  nitroGLYCERIN (NITROSTAT) 0.4 MG SL tablet Place 0.4 mg under the tongue every 5 (five) minutes as needed. For chest pain  05/25/11   Jodelle Gross, NP  risperiDONE (RISPERDAL) 0.5 MG tablet Take 0.5 mg by mouth daily.    Historical Provider, MD  Salicylic Acid (SALEX) 6 % SHAM Apply 1 application topically once a week. For psoriasis    Historical Provider, MD     Positive ROS: none  All other systems have been reviewed and were otherwise negative with the exception of  those mentioned in the HPI and as above.  Physical Exam: Filed Vitals:   08/16/13 0750  BP: 99/78  Pulse: 72  Temp: 98.1 F (36.7 C)  Resp: 20    General: Alert, no acute distress Cardiovascular: No pedal edema Respiratory: No cyanosis, no use of accessory musculature GI: No organomegaly, abdomen is soft and non-tender Skin: No lesions in the area of chief complaint Neurologic: Sensation intact distally Psychiatric: Patient is competent for consent with normal mood and affect Lymphatic: No axillary or cervical lymphadenopathy  MUSCULOSKELETAL: l ankle painful rom and prominent hardware  Assessment/Plan: RETAINED PAINFUL HARDWARE LEFT  ANKLE Plan for Procedure(s): REMOVAL 2 K WIRES FROM LEFT ANKLE.   The risks benefits and alternatives were discussed with the patient including but not limited to the risks of nonoperative treatment, versus surgical intervention including infection, bleeding, nerve injury, malunion, nonunion, hardware prominence, hardware failure, need for hardware removal, blood clots, cardiopulmonary complications, morbidity, mortality, among others, and they were willing to proceed.  Predicted outcome is good, although there will be at least a six to nine month expected recovery.  Jeorgia Helming L, MD 08/16/2013 9:37 AM

## 2013-08-16 NOTE — Preoperative (Signed)
Beta Blockers   Reason not to administer Beta Blockers:Not Applicable 

## 2013-08-16 NOTE — Anesthesia Procedure Notes (Signed)
Procedure Name: MAC Performed by: Jeydi Klingel W Pre-anesthesia Checklist: Patient identified, Timeout performed, Emergency Drugs available, Suction available and Patient being monitored Oxygen Delivery Method: Simple face mask Placement Confirmation: positive ETCO2       

## 2013-08-16 NOTE — Anesthesia Postprocedure Evaluation (Signed)
Anesthesia Post Note  Patient: Angela [name]  Procedure(s) Performed: Procedure(s) (LRB): REMOVAL 2 K WIRES FROM LEFT ANKLE.  (Left)  Anesthesia type: MAC  Patient location: PACU  Post pain: Pain level controlled and Adequate analgesia  Post assessment: Post-op Vital signs reviewed, Patient's Cardiovascular Status Stable and Respiratory Function Stable  Last Vitals:  Filed Vitals:   08/16/13 1020  BP:   Pulse: 65  Temp: 36.6 C  Resp: 16    Post vital signs: Reviewed and stable  Level of consciousness: awake, alert  and oriented  Complications: No apparent anesthesia complications

## 2013-08-16 NOTE — Anesthesia Preprocedure Evaluation (Signed)
Anesthesia Evaluation  Patient identified by MRN, date of birth, ID band Patient awake    Reviewed: Allergy & Precautions, H&P , NPO status , Patient's Chart, lab work & pertinent test results  Airway Mallampati: II  Neck ROM: full    Dental   Pulmonary former smoker,          Cardiovascular hypertension, + CAD, + Past MI and +CHF + dysrhythmias Atrial Fibrillation     Neuro/Psych TIA   GI/Hepatic GERD-  ,  Endo/Other    Renal/GU      Musculoskeletal   Abdominal   Peds  Hematology   Anesthesia Other Findings   Reproductive/Obstetrics                           Anesthesia Physical Anesthesia Plan  ASA: III  Anesthesia Plan: MAC   Post-op Pain Management:    Induction: Intravenous  Airway Management Planned: Simple Face Mask  Additional Equipment:   Intra-op Plan:   Post-operative Plan:   Informed Consent: I have reviewed the patients History and Physical, chart, labs and discussed the procedure including the risks, benefits and alternatives for the proposed anesthesia with the patient or authorized representative who has indicated his/her understanding and acceptance.     Plan Discussed with: CRNA, Anesthesiologist and Surgeon  Anesthesia Plan Comments:         Anesthesia Quick Evaluation

## 2013-08-19 ENCOUNTER — Encounter (HOSPITAL_BASED_OUTPATIENT_CLINIC_OR_DEPARTMENT_OTHER): Payer: Self-pay | Admitting: Orthopedic Surgery

## 2013-08-19 NOTE — Op Note (Signed)
NAMELALANIA, HASEMAN                ACCOUNT NO.:  [id number]  MEDICAL RECORD NO.:  [id number]  LOCATION:                               FACILITY:  [facility]  PHYSICIAN:  Harvie Junior, M.D.   DATE OF BIRTH:  [birth year]  DATE OF PROCEDURE:  08/16/2013 DATE OF DISCHARGE:  08/16/2013                              OPERATIVE REPORT   PREOPERATIVE DIAGNOSIS:  Retained hardware of medial ankle, right.  POSTOPERATIVE DIAGNOSIS:  Retained hardware of medial ankle, right.  PROCEDURE:  Removal of deep hardware, right ankle.  SURGEON:  Harvie Junior, M.D.  ASSISTANT:  None.  ANESTHESIA:  General.  BRIEF HISTORY:  Ms. Kawabata is a 79 year old female with history of having had an ORIF of her ankle.  She had some prominent hardware medially which kept breaking down her scan and causing her irritation and redness.  We saw her in the office and felt that removal under anesthesia was appropriate and after discussion and understanding the risks, she was brought to the operating room for this procedure.  DESCRIPTION OF PROCEDURE:  The patient was brought to the operating room.  After adequate anesthesia was obtained with general anesthetic, the patient was placed supine on the operating table.  The left leg was prepped and draped in usual sterile fashion.  Following this, the leg was elevated for 3 minutes.  Blood pressure tourniquet inflated to 250 mmHg.  Following this, incision was made.  There were old incision in the subcutaneous tissue down to the level of the medial malleolus.  The initial pin was encountered easily.  We brought in fluoro.  The other pin was anterior, dissected over there, I found that pulled it out.  The wound was irrigated, suctioned and dry closed with a running 3-0 nylon. Sterile compressive dressing was applied, and the patient was taken to recovery room and she was noted to be in satisfactory condition. Estimated blood loss for the procedure was none.     Harvie Junior, M.D.     Ranae Plumber  D:  08/16/2013  T:  08/16/2013  Job:  [id number]

## 2013-08-21 ENCOUNTER — Ambulatory Visit (INDEPENDENT_AMBULATORY_CARE_PROVIDER_SITE_OTHER): Payer: PRIVATE HEALTH INSURANCE | Admitting: *Deleted

## 2013-08-21 DIAGNOSIS — Z7901 Long term (current) use of anticoagulants: Secondary | ICD-10-CM

## 2013-08-21 DIAGNOSIS — I4891 Unspecified atrial fibrillation: Secondary | ICD-10-CM

## 2013-09-19 ENCOUNTER — Ambulatory Visit (INDEPENDENT_AMBULATORY_CARE_PROVIDER_SITE_OTHER): Payer: PRIVATE HEALTH INSURANCE | Admitting: *Deleted

## 2013-09-19 DIAGNOSIS — I4891 Unspecified atrial fibrillation: Secondary | ICD-10-CM

## 2013-09-19 DIAGNOSIS — Z7901 Long term (current) use of anticoagulants: Secondary | ICD-10-CM

## 2013-10-17 ENCOUNTER — Ambulatory Visit (INDEPENDENT_AMBULATORY_CARE_PROVIDER_SITE_OTHER): Payer: PRIVATE HEALTH INSURANCE | Admitting: *Deleted

## 2013-10-17 DIAGNOSIS — I4891 Unspecified atrial fibrillation: Secondary | ICD-10-CM

## 2013-10-17 DIAGNOSIS — Z7901 Long term (current) use of anticoagulants: Secondary | ICD-10-CM

## 2013-10-17 LAB — POCT INR: INR: 2.4

## 2013-12-05 ENCOUNTER — Ambulatory Visit (INDEPENDENT_AMBULATORY_CARE_PROVIDER_SITE_OTHER): Payer: PRIVATE HEALTH INSURANCE | Admitting: *Deleted

## 2013-12-05 DIAGNOSIS — I4891 Unspecified atrial fibrillation: Secondary | ICD-10-CM

## 2013-12-05 DIAGNOSIS — Z7901 Long term (current) use of anticoagulants: Secondary | ICD-10-CM

## 2013-12-05 DIAGNOSIS — I480 Paroxysmal atrial fibrillation: Secondary | ICD-10-CM

## 2013-12-05 DIAGNOSIS — Z5181 Encounter for therapeutic drug level monitoring: Secondary | ICD-10-CM | POA: Insufficient documentation

## 2013-12-05 LAB — POCT INR: INR: 3.9

## 2013-12-19 ENCOUNTER — Ambulatory Visit (INDEPENDENT_AMBULATORY_CARE_PROVIDER_SITE_OTHER): Payer: PRIVATE HEALTH INSURANCE | Admitting: *Deleted

## 2013-12-19 DIAGNOSIS — Z7901 Long term (current) use of anticoagulants: Secondary | ICD-10-CM

## 2013-12-19 DIAGNOSIS — I480 Paroxysmal atrial fibrillation: Secondary | ICD-10-CM

## 2013-12-19 DIAGNOSIS — Z5181 Encounter for therapeutic drug level monitoring: Secondary | ICD-10-CM

## 2013-12-19 DIAGNOSIS — I4891 Unspecified atrial fibrillation: Secondary | ICD-10-CM

## 2013-12-19 LAB — POCT INR: INR: 2.9

## 2014-01-16 ENCOUNTER — Ambulatory Visit (INDEPENDENT_AMBULATORY_CARE_PROVIDER_SITE_OTHER): Payer: PRIVATE HEALTH INSURANCE | Admitting: *Deleted

## 2014-01-16 DIAGNOSIS — I4891 Unspecified atrial fibrillation: Secondary | ICD-10-CM

## 2014-01-16 DIAGNOSIS — Z7901 Long term (current) use of anticoagulants: Secondary | ICD-10-CM

## 2014-01-16 DIAGNOSIS — Z5181 Encounter for therapeutic drug level monitoring: Secondary | ICD-10-CM

## 2014-01-16 DIAGNOSIS — I480 Paroxysmal atrial fibrillation: Secondary | ICD-10-CM

## 2014-01-16 LAB — POCT INR: INR: 3

## 2014-02-13 ENCOUNTER — Ambulatory Visit (INDEPENDENT_AMBULATORY_CARE_PROVIDER_SITE_OTHER): Payer: PRIVATE HEALTH INSURANCE | Admitting: *Deleted

## 2014-02-13 DIAGNOSIS — I480 Paroxysmal atrial fibrillation: Secondary | ICD-10-CM

## 2014-02-13 DIAGNOSIS — I4891 Unspecified atrial fibrillation: Secondary | ICD-10-CM

## 2014-02-13 DIAGNOSIS — Z5181 Encounter for therapeutic drug level monitoring: Secondary | ICD-10-CM

## 2014-02-13 DIAGNOSIS — Z7901 Long term (current) use of anticoagulants: Secondary | ICD-10-CM

## 2014-02-13 LAB — POCT INR: INR: 2.9

## 2014-03-19 ENCOUNTER — Ambulatory Visit (INDEPENDENT_AMBULATORY_CARE_PROVIDER_SITE_OTHER): Payer: PRIVATE HEALTH INSURANCE | Admitting: *Deleted

## 2014-03-19 DIAGNOSIS — Z7901 Long term (current) use of anticoagulants: Secondary | ICD-10-CM

## 2014-03-19 DIAGNOSIS — Z5181 Encounter for therapeutic drug level monitoring: Secondary | ICD-10-CM

## 2014-03-19 DIAGNOSIS — I480 Paroxysmal atrial fibrillation: Secondary | ICD-10-CM

## 2014-03-19 DIAGNOSIS — I4891 Unspecified atrial fibrillation: Secondary | ICD-10-CM

## 2014-03-19 LAB — POCT INR: INR: 2.1

## 2014-04-30 ENCOUNTER — Ambulatory Visit (INDEPENDENT_AMBULATORY_CARE_PROVIDER_SITE_OTHER): Payer: PRIVATE HEALTH INSURANCE | Admitting: *Deleted

## 2014-04-30 DIAGNOSIS — Z7901 Long term (current) use of anticoagulants: Secondary | ICD-10-CM

## 2014-04-30 DIAGNOSIS — I480 Paroxysmal atrial fibrillation: Secondary | ICD-10-CM

## 2014-04-30 DIAGNOSIS — Z5181 Encounter for therapeutic drug level monitoring: Secondary | ICD-10-CM

## 2014-04-30 DIAGNOSIS — I4891 Unspecified atrial fibrillation: Secondary | ICD-10-CM

## 2014-04-30 LAB — POCT INR: INR: 2

## 2014-06-06 ENCOUNTER — Encounter: Payer: Self-pay | Admitting: Cardiovascular Disease

## 2014-06-06 ENCOUNTER — Ambulatory Visit (INDEPENDENT_AMBULATORY_CARE_PROVIDER_SITE_OTHER): Payer: PRIVATE HEALTH INSURANCE | Admitting: Cardiovascular Disease

## 2014-06-06 VITALS — BP 114/72 | HR 95 | Ht 59.0 in | Wt 118.0 lb

## 2014-06-06 DIAGNOSIS — I4891 Unspecified atrial fibrillation: Secondary | ICD-10-CM

## 2014-06-06 DIAGNOSIS — I5032 Chronic diastolic (congestive) heart failure: Secondary | ICD-10-CM

## 2014-06-06 DIAGNOSIS — Z7901 Long term (current) use of anticoagulants: Secondary | ICD-10-CM

## 2014-06-06 DIAGNOSIS — I7090 Unspecified atherosclerosis: Secondary | ICD-10-CM

## 2014-06-06 DIAGNOSIS — I2510 Atherosclerotic heart disease of native coronary artery without angina pectoris: Secondary | ICD-10-CM

## 2014-06-06 DIAGNOSIS — E785 Hyperlipidemia, unspecified: Secondary | ICD-10-CM

## 2014-06-06 DIAGNOSIS — I10 Essential (primary) hypertension: Secondary | ICD-10-CM

## 2014-06-06 DIAGNOSIS — I482 Chronic atrial fibrillation, unspecified: Secondary | ICD-10-CM

## 2014-06-06 NOTE — Progress Notes (Signed)
Patient ID: Angela [name], female   DOB: [birth year], 80 y.o.   MRN: [id number]      SUBJECTIVE: Angela [name] has a history of permanent atrial fibrillation, CAD, HTN, hyperlipidemia, and chronic diastolic heart failure. She is maintained on warfarin and has had a prior CVA. She is here with her son, whom she lives with. She denies chest pain, shortness of breath, palpitations and leg swelling. Her son says that she will stop walking and he has to remind her to walk. She has not had any falls.  ECG performed in the office today demonstrates atrial fibrillation with PVCs, heart rate 89 beats per minute, right bundle branch block, left posterior fascicular block, and T wave abnormality.   Review of Systems: As per "subjective", otherwise negative.  Allergies  Allergen Reactions  . Haldol [Haloperidol Decanoate]     Over sedation  . Sulfa Antibiotics Other (See Comments)    uknown  . Sulfonamide Derivatives Other (See Comments)    unknown    Current Outpatient Prescriptions  Medication Sig Dispense Refill  . albuterol (PROVENTIL) (2.5 MG/3ML) 0.083% nebulizer solution Take 2.5 mg by nebulization every 4 (four) hours as needed. For shortness of breath/cough      . albuterol-ipratropium (COMBIVENT) 18-103 MCG/ACT inhaler Inhale 2 puffs into the lungs every 6 (six) hours as needed. For shortness of breath      . alendronate (FOSAMAX) 70 MG tablet Take 70 mg by mouth every 7 (seven) days. Take with a full glass of water on an empty stomach.       . cetirizine (ZYRTEC) 10 MG tablet Take 10 mg by mouth at bedtime.       . Cholecalciferol (VITAMIN D) 1000 UNITS capsule Take 2,000 Units by mouth every morning.       . Cyanocobalamin (VITAMIN B-12) 5000 MCG SUBL Place 5,000 mcg under the tongue daily.      Marland Kitchen donepezil (ARICEPT) 5 MG tablet TAKE 2 TABLETS EVERY DAY  30 tablet  0  . dronabinol (MARINOL) 2.5 MG capsule Take 2.5 mg by mouth 2 (two) times daily before a meal.      . furosemide (LASIX) 40  MG tablet Take 40 mg by mouth as needed.       Marland Kitchen guaiFENesin-dextromethorphan (ROBITUSSIN DM) 100-10 MG/5ML syrup Take 5 mLs by mouth every 4 (four) hours as needed for cough.  118 mL  0  . lisinopril (PRINIVIL,ZESTRIL) 2.5 MG tablet Take 2.5 mg by mouth every morning.       Marland Kitchen LORazepam (ATIVAN) 0.5 MG tablet Take 0.5 mg by mouth 3 (three) times daily. Anxiety and or agitation. *Last dose is not to be given, if patient receives sleeping medication HALCION (TRIAZOLAM)*      . memantine (NAMENDA) 10 MG tablet Take 10 mg by mouth 2 (two) times daily.       . Multiple Vitamins-Minerals (MULTIVITAMIN WITH MINERALS) tablet Take 1 tablet by mouth every morning.       . nitroGLYCERIN (NITROSTAT) 0.4 MG SL tablet Place 0.4 mg under the tongue every 5 (five) minutes as needed. For chest pain       . omeprazole (PRILOSEC) 40 MG capsule Take 40 mg by mouth daily.      . polyethylene glycol (MIRALAX / GLYCOLAX) packet Take 17 g by mouth daily as needed. constip      . potassium chloride SA (K-DUR,KLOR-CON) 20 MEQ tablet Take 20 mEq by mouth daily.      . pravastatin (PRAVACHOL)  40 MG tablet TAKE 1 TABLET (40 MG TOTAL) BY MOUTH AT BEDTIME.  30 tablet  6  . risperiDONE (RISPERDAL) 0.5 MG tablet Take 0.5 mg by mouth daily.      . Salicylic Acid (SALEX) 6 % SHAM Apply 1 application topically once a week. For psoriasis      . traMADol (ULTRAM) 50 MG tablet Take 1 tablet (50 mg total) by mouth every 6 (six) hours as needed for moderate pain.  30 tablet  0  . triazolam (HALCION) 0.125 MG tablet Take 0.125 mg by mouth at bedtime as needed (*IF THIS MEDICATION IS GIVEN, PATIENT DOES NOT TAKE NIGHT DOSE OF ATIVAN (LORAZEPAM)*). For sleep      . vitamin C (ASCORBIC ACID) 500 MG tablet Take 500 mg by mouth every morning.       . warfarin (COUMADIN) 2.5 MG tablet Take 2.5 mg by mouth every evening.       . warfarin (COUMADIN) 2.5 MG tablet Take 1 tablet daily except 1/2 tablets on Sundays  30 tablet  3   No current  facility-administered medications for this visit.    Past Medical History  Diagnosis Date  . ASCVD (arteriosclerotic cardiovascular disease)     acute MI in 1993;40%left main,80%LAD, 100%RCA-PTCA; EF of 40%;stress nuclear in2007; normal EF;  mixed inferolateral ischemia and infarction; CHF with normal EF in 2011  . CHF (congestive heart failure)     normal EF;mixed inferolateral ischemic and infarction;chf with normal EF in 2011  . Hypertension   . Hyperlipidemia   . Cerebrovascular disease     CVA by CT in 2007,but not noted in 2009;duplex in 12/2006 plaque without stenosis; Emergency Room evaluation in 12/2010 for transient aphasia and paresis-clopidogrel initially added to ASA Rx, but subsequently warfarin was substituted for these 2 agents  . Tobacco abuse, in remission     quit in 1995  . Claudication   . Dementia     mild  . Kyphosis     severe  . Decreased hearing   . Atrial fibrillation   . Chronic anticoagulation   . TIA (transient ischemic attack)   . Renal disorder   . Glass eye     right-1990  . HOH (hard of hearing)   . Wears glasses   . GERD (gastroesophageal reflux disease)     Past Surgical History  Procedure Laterality Date  . Abdominal hysterectomy    . Appendectomy    . Tonsillectomy    . Enucleation      Right-resulted from infection  . Hardware removal Left 08/16/2013    Procedure: REMOVAL 2 K WIRES FROM LEFT ANKLE. ;  Surgeon: Harvie Junior, MD;  Location: [facility];  Service: Orthopedics;  Laterality: Left;  REMOVAL 2 K WIRES FROM LEFT ANKLE.      History   Social History  . Marital Status: Divorced    Spouse Name: Angela [name]    Number of Children: 2  . Years of Education: Angela [name]   Occupational History  . retired    Social History Main Topics  . Smoking status: Former Smoker    Quit date: 01/05/1994  . Smokeless tobacco: Never Used  . Alcohol Use: No  . Drug Use: No  . Sexual Activity: Yes    Birth Control/ Protection: Surgical     Other Topics Concern  . Not on file   Social History Narrative  . No narrative on file     Filed Vitals:   06/06/14  1554  BP: 114/72  Pulse: 95  Height:  (1.499 m)  Weight: 118 lb (53.524 kg)    PHYSICAL EXAM General: NAD, elderly, frail Neck: No JVD, no thyromegaly or thyroid nodule.  Lungs: Clear to auscultation bilaterally with normal respiratory effort.  CV: Nondisplaced PMI. Irregular rhythm, normal S1/S2, no S3, II/VI systolic murmur along left sternal border. No peripheral edema. Abdomen: Soft, no distention.  Neurologic: Alert. Psych: Normal affect.  Extremities: No clubbing or cyanosis.  Skin: Normal. Musculoskeletal: Kyphosis.   ECG: Most recent ECG reviewed.   ASSESSMENT AND PLAN: 1. CAD: Stable ischemic heart disease on current medical therapy.  2. Permanent atrial fibrillation: Rate-controlled and asymptomatic. Continue with warfarin for anticoagulation. 3. Essential HTN: Controlled on present therapy. 4. Chronic diastolic heart failure: Compensated with good BP control. Has not needed Lasix regularly.   Dispo: f/u 1 year.  Prentice Docker, M.D., F.A.C.C.

## 2014-06-06 NOTE — Patient Instructions (Signed)
Your physician wants you to follow-up in: 1 year You will receive a reminder letter in the mail two months in advance. If you don't receive a letter, please call our office to schedule the follow-up appointment.    Your physician recommends that you continue on your current medications as directed. Please refer to the Current Medication list given to you today.     Thank you for choosing [facility] !  

## 2014-06-10 NOTE — Addendum Note (Signed)
Addended by: Marlyn Corporal A on: 06/10/2014 01:40 PM   Modules accepted: Orders

## 2014-06-11 ENCOUNTER — Encounter: Payer: Self-pay | Admitting: *Deleted

## 2014-06-21 ENCOUNTER — Emergency Department (HOSPITAL_COMMUNITY)
Admission: EM | Admit: 2014-06-21 | Discharge: 2014-06-21 | Disposition: A | Discharge status: Home / Self Care | Payer: PRIVATE HEALTH INSURANCE | Attending: Emergency Medicine | Admitting: Emergency Medicine

## 2014-06-21 ENCOUNTER — Emergency Department (HOSPITAL_COMMUNITY): Payer: PRIVATE HEALTH INSURANCE

## 2014-06-21 ENCOUNTER — Encounter (HOSPITAL_COMMUNITY): Payer: Self-pay | Admitting: Emergency Medicine

## 2014-06-21 DIAGNOSIS — H9190 Unspecified hearing loss, unspecified ear: Secondary | ICD-10-CM | POA: Insufficient documentation

## 2014-06-21 DIAGNOSIS — Z8739 Personal history of other diseases of the musculoskeletal system and connective tissue: Secondary | ICD-10-CM | POA: Insufficient documentation

## 2014-06-21 DIAGNOSIS — S0003XA Contusion of scalp, initial encounter: Secondary | ICD-10-CM | POA: Insufficient documentation

## 2014-06-21 DIAGNOSIS — I10 Essential (primary) hypertension: Secondary | ICD-10-CM | POA: Diagnosis not present

## 2014-06-21 DIAGNOSIS — G309 Alzheimer's disease, unspecified: Secondary | ICD-10-CM | POA: Insufficient documentation

## 2014-06-21 DIAGNOSIS — Z8673 Personal history of transient ischemic attack (TIA), and cerebral infarction without residual deficits: Secondary | ICD-10-CM | POA: Diagnosis not present

## 2014-06-21 DIAGNOSIS — Z7901 Long term (current) use of anticoagulants: Secondary | ICD-10-CM | POA: Diagnosis not present

## 2014-06-21 DIAGNOSIS — Z79899 Other long term (current) drug therapy: Secondary | ICD-10-CM | POA: Insufficient documentation

## 2014-06-21 DIAGNOSIS — S0990XA Unspecified injury of head, initial encounter: Secondary | ICD-10-CM | POA: Insufficient documentation

## 2014-06-21 DIAGNOSIS — K219 Gastro-esophageal reflux disease without esophagitis: Secondary | ICD-10-CM | POA: Insufficient documentation

## 2014-06-21 DIAGNOSIS — Z87891 Personal history of nicotine dependence: Secondary | ICD-10-CM | POA: Diagnosis not present

## 2014-06-21 DIAGNOSIS — Y9389 Activity, other specified: Secondary | ICD-10-CM | POA: Insufficient documentation

## 2014-06-21 DIAGNOSIS — E785 Hyperlipidemia, unspecified: Secondary | ICD-10-CM | POA: Diagnosis not present

## 2014-06-21 DIAGNOSIS — S1093XA Contusion of unspecified part of neck, initial encounter: Principal | ICD-10-CM

## 2014-06-21 DIAGNOSIS — W19XXXA Unspecified fall, initial encounter: Secondary | ICD-10-CM | POA: Diagnosis not present

## 2014-06-21 DIAGNOSIS — I509 Heart failure, unspecified: Secondary | ICD-10-CM | POA: Insufficient documentation

## 2014-06-21 DIAGNOSIS — Y92009 Unspecified place in unspecified non-institutional (private) residence as the place of occurrence of the external cause: Secondary | ICD-10-CM | POA: Insufficient documentation

## 2014-06-21 DIAGNOSIS — Z87448 Personal history of other diseases of urinary system: Secondary | ICD-10-CM | POA: Diagnosis not present

## 2014-06-21 DIAGNOSIS — S0083XA Contusion of other part of head, initial encounter: Principal | ICD-10-CM | POA: Insufficient documentation

## 2014-06-21 DIAGNOSIS — F0280 Dementia in other diseases classified elsewhere without behavioral disturbance: Secondary | ICD-10-CM | POA: Insufficient documentation

## 2014-06-21 DIAGNOSIS — I4891 Unspecified atrial fibrillation: Secondary | ICD-10-CM | POA: Insufficient documentation

## 2014-06-21 HISTORY — DX: Dementia in other diseases classified elsewhere, unspecified severity, without behavioral disturbance, psychotic disturbance, mood disturbance, and anxiety: F02.80

## 2014-06-21 HISTORY — DX: Alzheimer's disease, unspecified: G30.9

## 2014-06-21 LAB — CBC WITH DIFFERENTIAL/PLATELET
Basophils Absolute: 0 10*3/uL (ref 0.0–0.1)
Basophils Relative: 0 % (ref 0–1)
Eosinophils Absolute: 0 10*3/uL (ref 0.0–0.7)
Eosinophils Relative: 0 % (ref 0–5)
HCT: 45.2 % (ref 36.0–46.0)
HEMOGLOBIN: 15 g/dL (ref 12.0–15.0)
LYMPHS ABS: 1 10*3/uL (ref 0.7–4.0)
Lymphocytes Relative: 7 % — ABNORMAL LOW (ref 12–46)
MCH: 29.8 pg (ref 26.0–34.0)
MCHC: 33.2 g/dL (ref 30.0–36.0)
MCV: 89.7 fL (ref 78.0–100.0)
MONOS PCT: 7 % (ref 3–12)
Monocytes Absolute: 1 10*3/uL (ref 0.1–1.0)
NEUTROS PCT: 86 % — AB (ref 43–77)
Neutro Abs: 13 10*3/uL — ABNORMAL HIGH (ref 1.7–7.7)
Platelets: 223 10*3/uL (ref 150–400)
RBC: 5.04 MIL/uL (ref 3.87–5.11)
RDW: 13.3 % (ref 11.5–15.5)
WBC: 15.1 10*3/uL — ABNORMAL HIGH (ref 4.0–10.5)

## 2014-06-21 LAB — COMPREHENSIVE METABOLIC PANEL
ALK PHOS: 100 U/L (ref 39–117)
ALT: 11 U/L (ref 0–35)
AST: 20 U/L (ref 0–37)
Albumin: 3.6 g/dL (ref 3.5–5.2)
Anion gap: 9 (ref 5–15)
BILIRUBIN TOTAL: 0.5 mg/dL (ref 0.3–1.2)
BUN: 12 mg/dL (ref 6–23)
CO2: 28 meq/L (ref 19–32)
Calcium: 9.3 mg/dL (ref 8.4–10.5)
Chloride: 96 mEq/L (ref 96–112)
Creatinine, Ser: 0.96 mg/dL (ref 0.50–1.10)
GFR calc non Af Amer: 50 mL/min — ABNORMAL LOW (ref >90)
GFR, EST AFRICAN AMERICAN: 58 mL/min — AB (ref >90)
GLUCOSE: 109 mg/dL — AB (ref 70–99)
POTASSIUM: 4.7 meq/L (ref 3.7–5.3)
Sodium: 133 mEq/L — ABNORMAL LOW (ref 137–147)
Total Protein: 7.1 g/dL (ref 6.0–8.3)

## 2014-06-21 LAB — URINE MICROSCOPIC-ADD ON

## 2014-06-21 LAB — URINALYSIS, ROUTINE W REFLEX MICROSCOPIC
BILIRUBIN URINE: NEGATIVE
Glucose, UA: NEGATIVE mg/dL
HGB URINE DIPSTICK: NEGATIVE
Ketones, ur: NEGATIVE mg/dL
Nitrite: NEGATIVE
PROTEIN: NEGATIVE mg/dL
SPECIFIC GRAVITY, URINE: 1.015 (ref 1.005–1.030)
Urobilinogen, UA: 0.2 mg/dL (ref 0.0–1.0)
pH: 5.5 (ref 5.0–8.0)

## 2014-06-21 LAB — CK: Total CK: 126 U/L (ref 7–177)

## 2014-06-21 LAB — TROPONIN I: Troponin I: <0.3 ng/mL (ref <0.30)

## 2014-06-21 MED ORDER — SODIUM CHLORIDE 0.9 % IV SOLN
Freq: Once | INTRAVENOUS | Status: AC
  Administered 2014-06-21: 14:00:00 via INTRAVENOUS

## 2014-06-21 NOTE — ED Provider Notes (Signed)
CSN: [id number]     Arrival date & time 06/21/14  1224 History   First MD Initiated Contact with Patient 06/21/14 1314     Chief Complaint  Patient presents with  . Fall     (Consider location/radiation/quality/duration/timing/severity/associated sxs/prior Treatment) Patient is a 80 y.o. female presenting with fall. The history is provided by a relative (Son states he found his mother on the floor today in her bed room.  he states she is usually very unsteady walking.  pt does not remember what happened.).  Fall This is a new problem. The current episode started 6 to 12 hours ago. The problem occurs rarely. The problem has been resolved. Pertinent negatives include no chest pain and no abdominal pain. Nothing aggravates the symptoms. Nothing relieves the symptoms.    Past Medical History  Diagnosis Date  . ASCVD (arteriosclerotic cardiovascular disease)     acute MI in 1993;40%left main,80%LAD, 100%RCA-PTCA; EF of 40%;stress nuclear in2007; normal EF;  mixed inferolateral ischemia and infarction; CHF with normal EF in 2011  . CHF (congestive heart failure)     normal EF;mixed inferolateral ischemic and infarction;chf with normal EF in 2011  . Hypertension   . Hyperlipidemia   . Cerebrovascular disease     CVA by CT in 2007,but not noted in 2009;duplex in 12/2006 plaque without stenosis; Emergency Room evaluation in 12/2010 for transient aphasia and paresis-clopidogrel initially added to ASA Rx, but subsequently warfarin was substituted for these 2 agents  . Tobacco abuse, in remission     quit in 1995  . Claudication   . Dementia     mild  . Kyphosis     severe  . Decreased hearing   . Atrial fibrillation   . Chronic anticoagulation   . TIA (transient ischemic attack)   . Renal disorder   . Glass eye     right-1990  . HOH (hard of hearing)   . Wears glasses   . GERD (gastroesophageal reflux disease)   . Alzheimer's dementia    Past Surgical History  Procedure Laterality  Date  . Abdominal hysterectomy    . Appendectomy    . Tonsillectomy    . Enucleation      Right-resulted from infection  . Hardware removal Left 08/16/2013    Procedure: REMOVAL 2 K WIRES FROM LEFT ANKLE. ;  Surgeon: Harvie Junior, MD;  Location: [facility];  Service: Orthopedics;  Laterality: Left;  REMOVAL 2 K WIRES FROM LEFT ANKLE.     Family History  Problem Relation Age of Onset  . Dementia Mother   . Atrial fibrillation Son   . Hypertension Son    History  Substance Use Topics  . Smoking status: Former Smoker    Start date: 06/06/1954    Quit date: 01/05/1994  . Smokeless tobacco: Never Used  . Alcohol Use: No   OB History   Grav Para Term Preterm Abortions TAB SAB Ect Mult Living                 Review of Systems  Unable to perform ROS: Dementia  Cardiovascular: Negative for chest pain.  Gastrointestinal: Negative for abdominal pain.      Allergies  Haldol; Sulfa antibiotics; and Sulfonamide derivatives  Home Medications   Prior to Admission medications   Medication Sig Start Date End Date Taking? Authorizing Provider  albuterol (PROVENTIL) (2.5 MG/3ML) 0.083% nebulizer solution Take 2.5 mg by nebulization every 4 (four) hours as needed. For shortness of breath/cough  08/09/12   Historical Provider, MD  albuterol-ipratropium (COMBIVENT) 18-103 MCG/ACT inhaler Inhale 2 puffs into the lungs every 6 (six) hours as needed. For shortness of breath    Historical Provider, MD  alendronate (FOSAMAX) 70 MG tablet Take 70 mg by mouth every 7 (seven) days. Take with a full glass of water on an empty stomach.     Historical Provider, MD  cetirizine (ZYRTEC) 10 MG tablet Take 10 mg by mouth at bedtime.     Historical Provider, MD  Cholecalciferol (VITAMIN D) 1000 UNITS capsule Take 2,000 Units by mouth every morning.     Historical Provider, MD  Cyanocobalamin (VITAMIN B-12) 5000 MCG SUBL Place 5,000 mcg under the tongue daily.    Historical Provider, MD   donepezil (ARICEPT) 5 MG tablet TAKE 2 TABLETS EVERY DAY 06/10/13   Delia Heady, MD  dronabinol (MARINOL) 2.5 MG capsule Take 2.5 mg by mouth 2 (two) times daily before a meal.    Historical Provider, MD  furosemide (LASIX) 40 MG tablet Take 40 mg by mouth as needed.     Historical Provider, MD  guaiFENesin-dextromethorphan (ROBITUSSIN DM) 100-10 MG/5ML syrup Take 5 mLs by mouth every 4 (four) hours as needed for cough. 02/09/13   Nimish Normajean Glasgow, MD  lisinopril (PRINIVIL,ZESTRIL) 2.5 MG tablet Take 2.5 mg by mouth every morning.     Historical Provider, MD  LORazepam (ATIVAN) 0.5 MG tablet Take 0.5 mg by mouth 3 (three) times daily. Anxiety and or agitation. *Last dose is not to be given, if patient receives sleeping medication HALCION (TRIAZOLAM)*    Historical Provider, MD  memantine (NAMENDA) 10 MG tablet Take 10 mg by mouth 2 (two) times daily.     Historical Provider, MD  Multiple Vitamins-Minerals (MULTIVITAMIN WITH MINERALS) tablet Take 1 tablet by mouth every morning.     Historical Provider, MD  nitroGLYCERIN (NITROSTAT) 0.4 MG SL tablet Place 0.4 mg under the tongue every 5 (five) minutes as needed. For chest pain  05/25/11   Jodelle Gross, NP  omeprazole (PRILOSEC) 40 MG capsule Take 40 mg by mouth daily.    Historical Provider, MD  polyethylene glycol (MIRALAX / GLYCOLAX) packet Take 17 g by mouth daily as needed. constip    Historical Provider, MD  potassium chloride SA (K-DUR,KLOR-CON) 20 MEQ tablet Take 20 mEq by mouth daily.    Historical Provider, MD  pravastatin (PRAVACHOL) 40 MG tablet TAKE 1 TABLET (40 MG TOTAL) BY MOUTH AT BEDTIME. 03/18/13   Jodelle Gross, NP  risperiDONE (RISPERDAL) 0.5 MG tablet Take 0.5 mg by mouth daily.    Historical Provider, MD  Salicylic Acid (SALEX) 6 % SHAM Apply 1 application topically once a week. For psoriasis    Historical Provider, MD  traMADol (ULTRAM) 50 MG tablet Take 1 tablet (50 mg total) by mouth every 6 (six) hours as needed for  moderate pain. 08/16/13   Matthew Folks, PA-C  triazolam (HALCION) 0.125 MG tablet Take 0.125 mg by mouth at bedtime as needed (*IF THIS MEDICATION IS GIVEN, PATIENT DOES NOT TAKE NIGHT DOSE OF ATIVAN (LORAZEPAM)*). For sleep    Historical Provider, MD  vitamin C (ASCORBIC ACID) 500 MG tablet Take 500 mg by mouth every morning.     Historical Provider, MD  warfarin (COUMADIN) 2.5 MG tablet Take 2.5 mg by mouth every evening.  06/15/12   Kathlen Brunswick, MD  warfarin (COUMADIN) 2.5 MG tablet Take 1 tablet daily except 1/2 tablets on Sundays 04/29/13  Kathlen Brunswick, MD   BP 115/71  Pulse 45  Temp(Src) 96.4 F (35.8 C) (Axillary)  Resp 14  Ht  (1.702 m)  Wt 120 lb (54.432 kg)  BMI 18.79 kg/m2  SpO2 93% Physical Exam  Constitutional: She appears well-developed.  HENT:  Head: Normocephalic.  brusing left forehead  Eyes: Conjunctivae and EOM are normal. No scleral icterus.  Neck: Neck supple. No thyromegaly present.  Cardiovascular: Normal rate and regular rhythm.  Exam reveals no gallop and no friction rub.   No murmur heard. Pulmonary/Chest: No stridor. She has no wheezes. She has no rales. She exhibits no tenderness.  Abdominal: She exhibits no distension. There is no tenderness. There is no rebound.  Musculoskeletal: Normal range of motion. She exhibits no edema.  Lymphadenopathy:    She has no cervical adenopathy.  Neurological: She is alert. She exhibits normal muscle tone. Coordination normal.  Pt oriented but does not remember what happened  Skin: No rash noted. No erythema.  Psychiatric: She has a normal mood and affect. Her behavior is normal.    ED Course  Procedures (including critical care time) Labs Review Labs Reviewed  CBC WITH DIFFERENTIAL - Abnormal; Notable for the following:    WBC 15.1 (*)    Neutrophils Relative % 86 (*)    Neutro Abs 13.0 (*)    Lymphocytes Relative 7 (*)    All other components within normal limits  COMPREHENSIVE METABOLIC  PANEL - Abnormal; Notable for the following:    Sodium 133 (*)    Glucose, Bld 109 (*)    GFR calc non Af Amer 50 (*)    GFR calc Af Amer 58 (*)    All other components within normal limits  URINALYSIS, ROUTINE W REFLEX MICROSCOPIC - Abnormal; Notable for the following:    Leukocytes, UA SMALL (*)    All other components within normal limits  URINE MICROSCOPIC-ADD ON - Abnormal; Notable for the following:    Squamous Epithelial / LPF FEW (*)    All other components within normal limits  TROPONIN I  CK    Imaging Review Ct Head Wo Contrast  06/21/2014   CLINICAL DATA:  Larey Seat today. Altered level of consciousness. Blood from the left nostril.  EXAM: CT HEAD WITHOUT CONTRAST  CT CERVICAL SPINE WITHOUT CONTRAST  TECHNIQUE: Multidetector CT imaging of the head and cervical spine was performed following the standard protocol without intravenous contrast. Multiplanar CT image reconstructions of the cervical spine were also generated.  COMPARISON:  Head CT, 02/06/2013  FINDINGS: CT HEAD FINDINGS  Ventricles are normal in overall configuration. There is ventricular and sulcal enlargement reflecting moderate atrophy. No parenchymal masses or mass effect. No evidence of an infarct. There are patchy areas of white matter hypoattenuation consistent with chronic microvascular ischemic change. Stable parenchymal calcifications are noted on the right.  No extra-axial masses or abnormal fluid collections.  No intracranial hemorrhage.  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  Sinuses and mastoid air cells are clear.  No skull fracture.  CT CERVICAL SPINE FINDINGS  No fracture. No spondylolisthesis. Moderate loss disc height at C5-C6 with endplate sclerosis and osteophytes. Minor loss of disc height at C6-C7. There are facet degenerative changes most prominent on the left at C3-C4. There are mild degrees of neural foraminal narrowing most evident at C5-C6. Bones are diffusely demineralized.  No soft tissue masses or  adenopathy. There are carotid vascular calcifications. A few small low-density areas are noted in the thyroid gland  consistent with sub cm nodules. Lung apices show mild interstitial thickening and scarring but are otherwise clear.  IMPRESSION: HEAD CT: No acute intracranial abnormalities. Atrophy and chronic microvascular ischemic change.  CERVICAL CT:  No fracture or acute finding.   Electronically Signed   By: Amie Portland M.D.   On: 06/21/2014 14:35   Ct Cervical Spine Wo Contrast  06/21/2014   CLINICAL DATA:  Larey Seat today. Altered level of consciousness. Blood from the left nostril.  EXAM: CT HEAD WITHOUT CONTRAST  CT CERVICAL SPINE WITHOUT CONTRAST  TECHNIQUE: Multidetector CT imaging of the head and cervical spine was performed following the standard protocol without intravenous contrast. Multiplanar CT image reconstructions of the cervical spine were also generated.  COMPARISON:  Head CT, 02/06/2013  FINDINGS: CT HEAD FINDINGS  Ventricles are normal in overall configuration. There is ventricular and sulcal enlargement reflecting moderate atrophy. No parenchymal masses or mass effect. No evidence of an infarct. There are patchy areas of white matter hypoattenuation consistent with chronic microvascular ischemic change. Stable parenchymal calcifications are noted on the right.  No extra-axial masses or abnormal fluid collections.  No intracranial hemorrhage.  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  Sinuses and mastoid air cells are clear.  No skull fracture.  CT CERVICAL SPINE FINDINGS  No fracture. No spondylolisthesis. Moderate loss disc height at C5-C6 with endplate sclerosis and osteophytes. Minor loss of disc height at C6-C7. There are facet degenerative changes most prominent on the left at C3-C4. There are mild degrees of neural foraminal narrowing most evident at C5-C6. Bones are diffusely demineralized.  No soft tissue masses or adenopathy. There are carotid vascular calcifications. A few small  low-density areas are noted in the thyroid gland consistent with sub cm nodules. Lung apices show mild interstitial thickening and scarring but are otherwise clear.  IMPRESSION: HEAD CT: No acute intracranial abnormalities. Atrophy and chronic microvascular ischemic change.  CERVICAL CT:  No fracture or acute finding.   Electronically Signed   By: Amie Portland M.D.   On: 06/21/2014 14:35   Dg Pelvis Portable  06/21/2014   CLINICAL DATA:  Status post unwitnessed fall; found on the floor by the bed  EXAM: PORTABLE PELVIS 1-2 VIEWS  COMPARISON:  Lumbar spine series of May 07, 2010  FINDINGS: The bony pelvis is osteopenic. There is no acute fracture. The hip joint spaces are reasonably well maintained. The femoral heads and necks and intertrochanteric regions are grossly normal. The sacrum is obscured by bowel gas. There are mild degenerative changes of the lumbar spine.  IMPRESSION: There is no acute bony abnormality of the pelvis.   Electronically Signed   By: David  [place]   On: 06/21/2014 14:01   Dg Chest Portable 1 View  06/21/2014   CLINICAL DATA:  Larey Seat.  EXAM: PORTABLE CHEST - 1 VIEW  COMPARISON:  02/27/2013.  FINDINGS: Stable enlarged cardiac silhouette. Clear lungs. Diffusely prominent interstitial markings. No fracture or pneumothorax seen.  IMPRESSION: No acute abnormality. Cardiomegaly and chronic interstitial lung disease.   Electronically Signed   By: Gordan Payment M.D.   On: 06/21/2014 14:00     EKG Interpretation   Date/Time:  Saturday June 21 2014 12:25:26 EDT Ventricular Rate:  71 PR Interval:    QRS Duration: 162 QT Interval:  387 QTC Calculation: 420 R Axis:   141 Text Interpretation:  Atrial fibrillation Ventricular bigeminy Nonspecific  intraventricular conduction delay Borderline repolarization abnormality  Confirmed by Amarachukwu Lakatos  MD, Kasee Hantz ([id number]) on 06/21/2014 3:03:30 PM  MDM   Final diagnoses:  Fall, initial encounter    According to family,  Pt is back to  normal.   Pt has nl tests.  She will follow up with her pcp and her son will watch after her    Benny Lennert, MD 06/21/14 1504

## 2014-06-21 NOTE — Discharge Instructions (Signed)
Follow up with dr. Sherwood Gambler this week.

## 2014-06-21 NOTE — ED Notes (Signed)
Pt found in floor this morning by son. Last time she was seen was around 9:30pm last night. Pt presents with hematoma above R eyebrow and small trickle of old, dried blood from L. Nostril. Currently in c-collar.

## 2014-06-30 ENCOUNTER — Ambulatory Visit (INDEPENDENT_AMBULATORY_CARE_PROVIDER_SITE_OTHER): Payer: PRIVATE HEALTH INSURANCE | Admitting: *Deleted

## 2014-06-30 DIAGNOSIS — I480 Paroxysmal atrial fibrillation: Secondary | ICD-10-CM

## 2014-06-30 DIAGNOSIS — Z7901 Long term (current) use of anticoagulants: Secondary | ICD-10-CM

## 2014-06-30 DIAGNOSIS — Z5181 Encounter for therapeutic drug level monitoring: Secondary | ICD-10-CM

## 2014-06-30 DIAGNOSIS — I4891 Unspecified atrial fibrillation: Secondary | ICD-10-CM

## 2014-06-30 LAB — POCT INR: INR: 3

## 2014-07-03 ENCOUNTER — Emergency Department (HOSPITAL_COMMUNITY)
Admission: EM | Admit: 2014-07-03 | Discharge: 2014-07-03 | Disposition: A | Discharge status: Home / Self Care | Payer: PRIVATE HEALTH INSURANCE | Attending: Emergency Medicine | Admitting: Emergency Medicine

## 2014-07-03 ENCOUNTER — Emergency Department (HOSPITAL_COMMUNITY): Payer: PRIVATE HEALTH INSURANCE

## 2014-07-03 ENCOUNTER — Encounter (HOSPITAL_COMMUNITY): Payer: Self-pay | Admitting: Emergency Medicine

## 2014-07-03 DIAGNOSIS — I4891 Unspecified atrial fibrillation: Secondary | ICD-10-CM | POA: Insufficient documentation

## 2014-07-03 DIAGNOSIS — Z7901 Long term (current) use of anticoagulants: Secondary | ICD-10-CM | POA: Insufficient documentation

## 2014-07-03 DIAGNOSIS — E785 Hyperlipidemia, unspecified: Secondary | ICD-10-CM | POA: Insufficient documentation

## 2014-07-03 DIAGNOSIS — F0280 Dementia in other diseases classified elsewhere without behavioral disturbance: Secondary | ICD-10-CM | POA: Diagnosis not present

## 2014-07-03 DIAGNOSIS — S8990XA Unspecified injury of unspecified lower leg, initial encounter: Secondary | ICD-10-CM | POA: Diagnosis present

## 2014-07-03 DIAGNOSIS — M79605 Pain in left leg: Secondary | ICD-10-CM

## 2014-07-03 DIAGNOSIS — Y929 Unspecified place or not applicable: Secondary | ICD-10-CM | POA: Insufficient documentation

## 2014-07-03 DIAGNOSIS — G309 Alzheimer's disease, unspecified: Secondary | ICD-10-CM | POA: Diagnosis not present

## 2014-07-03 DIAGNOSIS — K219 Gastro-esophageal reflux disease without esophagitis: Secondary | ICD-10-CM | POA: Diagnosis not present

## 2014-07-03 DIAGNOSIS — W19XXXA Unspecified fall, initial encounter: Secondary | ICD-10-CM

## 2014-07-03 DIAGNOSIS — Z8673 Personal history of transient ischemic attack (TIA), and cerebral infarction without residual deficits: Secondary | ICD-10-CM | POA: Insufficient documentation

## 2014-07-03 DIAGNOSIS — I10 Essential (primary) hypertension: Secondary | ICD-10-CM | POA: Diagnosis not present

## 2014-07-03 DIAGNOSIS — I509 Heart failure, unspecified: Secondary | ICD-10-CM | POA: Diagnosis not present

## 2014-07-03 DIAGNOSIS — Y939 Activity, unspecified: Secondary | ICD-10-CM | POA: Diagnosis not present

## 2014-07-03 DIAGNOSIS — Z87448 Personal history of other diseases of urinary system: Secondary | ICD-10-CM | POA: Insufficient documentation

## 2014-07-03 DIAGNOSIS — Z87440 Personal history of urinary (tract) infections: Secondary | ICD-10-CM | POA: Diagnosis not present

## 2014-07-03 DIAGNOSIS — W1830XA Fall on same level, unspecified, initial encounter: Secondary | ICD-10-CM | POA: Diagnosis not present

## 2014-07-03 NOTE — ED Provider Notes (Signed)
CSN: [id number]     Arrival date & time 07/03/14  0909 History  This chart was scribed for Angela Octave, MD by Tonye Royalty, ED Scribe. This patient was seen in room APA05/APA05 and the patient's care was started at 9:25 AM.    Chief Complaint  Patient presents with  . Leg Pain   The history is provided by a relative and medical records. No language interpreter was used.   HPI Comments: Angela [name] is a 80 y.o. female with history of TIAs who presents to the Emergency Department complaining of left leg pain with ambulation. History is limited due to dementia. She denies any pain at this time. Per son with whom she lives, last night he went out to the car to get something then came in to see her on the floor next to her chair; he assumes she slid off and fell. He states she was able to ambulate with a walker last night without complaint but reported pain this morning upon ambulation. He states she was tender upon palpation to the area around her knee. Per son, she always uses a walker to ambulate. Per son, she takes Coumadin for atrial fibrillation. He states she is not more confused than normal. She was seen here on the 9/19 after a fall and was discharged after benign imaging studies. Records indicate prior ankle surgery by Dr. Luiz Blare. She denies chest pain, abdominal pain, headache, neck pain, back pain, hip pain, or knee pain.  Past Medical History  Diagnosis Date  . ASCVD (arteriosclerotic cardiovascular disease)     acute MI in 1993;40%left main,80%LAD, 100%RCA-PTCA; EF of 40%;stress nuclear in2007; normal EF;  mixed inferolateral ischemia and infarction; CHF with normal EF in 2011  . CHF (congestive heart failure)     normal EF;mixed inferolateral ischemic and infarction;chf with normal EF in 2011  . Hypertension   . Hyperlipidemia   . Cerebrovascular disease     CVA by CT in 2007,but not noted in 2009;duplex in 12/2006 plaque without stenosis; Emergency Room evaluation in 12/2010 for  transient aphasia and paresis-clopidogrel initially added to ASA Rx, but subsequently warfarin was substituted for these 2 agents  . Tobacco abuse, in remission     quit in 1995  . Claudication   . Dementia     mild  . Kyphosis     severe  . Decreased hearing   . Atrial fibrillation   . Chronic anticoagulation   . TIA (transient ischemic attack)   . Renal disorder   . Glass eye     right-1990  . HOH (hard of hearing)   . Wears glasses   . GERD (gastroesophageal reflux disease)   . Alzheimer's dementia    Past Surgical History  Procedure Laterality Date  . Abdominal hysterectomy    . Appendectomy    . Tonsillectomy    . Enucleation      Right-resulted from infection  . Hardware removal Left 08/16/2013    Procedure: REMOVAL 2 K WIRES FROM LEFT ANKLE. ;  Surgeon: Harvie Junior, MD;  Location: [facility];  Service: Orthopedics;  Laterality: Left;  REMOVAL 2 K WIRES FROM LEFT ANKLE.     Family History  Problem Relation Age of Onset  . Dementia Mother   . Atrial fibrillation Son   . Hypertension Son    History  Substance Use Topics  . Smoking status: Former Smoker    Start date: 06/06/1954    Quit date: 01/05/1994  .  Smokeless tobacco: Never Used  . Alcohol Use: No   OB History   Grav Para Term Preterm Abortions TAB SAB Ect Mult Living                 Review of Systems  Unable to perform ROS: Dementia  Cardiovascular: Negative for chest pain.  Gastrointestinal: Negative for abdominal pain.  Musculoskeletal: Negative for arthralgias, back pain and neck pain.       Right lower extremity pain  Neurological: Negative for headaches.  Psychiatric/Behavioral: Negative for confusion (baseline).    Allergies  Haldol and Sulfa antibiotics  Home Medications   Prior to Admission medications   Medication Sig Start Date End Date Taking? Authorizing Provider  albuterol (PROVENTIL) (2.5 MG/3ML) 0.083% nebulizer solution Take 2.5 mg by nebulization every 4  (four) hours as needed. For shortness of breath/cough 08/09/12  Yes Historical Provider, MD  albuterol-ipratropium (COMBIVENT) 18-103 MCG/ACT inhaler Inhale 2 puffs into the lungs every 6 (six) hours as needed. For shortness of breath   Yes Historical Provider, MD  alendronate (FOSAMAX) 70 MG tablet Take 70 mg by mouth every 7 (seven) days. Take with a full glass of water on an empty stomach.    Yes Historical Provider, MD  cetirizine (ZYRTEC) 10 MG tablet Take 10 mg by mouth at bedtime.    Yes Historical Provider, MD  Cholecalciferol (VITAMIN D) 1000 UNITS capsule Take 2,000 Units by mouth every morning.    Yes Historical Provider, MD  Cyanocobalamin (VITAMIN B-12) 5000 MCG SUBL Place 5,000 mcg under the tongue daily.   Yes Historical Provider, MD  donepezil (ARICEPT) 5 MG tablet TAKE 2 TABLETS EVERY DAY 06/10/13  Yes Delia Heady, MD  furosemide (LASIX) 40 MG tablet Take 40 mg by mouth as needed for fluid.    Yes Historical Provider, MD  lisinopril (PRINIVIL,ZESTRIL) 2.5 MG tablet Take 2.5 mg by mouth every morning.    Yes Historical Provider, MD  LORazepam (ATIVAN) 0.5 MG tablet Take 0.5 mg by mouth 3 (three) times daily. Anxiety and or agitation. *Last dose is not to be given, if patient receives sleeping medication HALCION (TRIAZOLAM)*   Yes Historical Provider, MD  memantine (NAMENDA) 10 MG tablet Take 10 mg by mouth 2 (two) times daily.    Yes Historical Provider, MD  Multiple Vitamins-Minerals (MULTIVITAMIN WITH MINERALS) tablet Take 1 tablet by mouth every morning.    Yes Historical Provider, MD  nitroGLYCERIN (NITROSTAT) 0.4 MG SL tablet Place 0.4 mg under the tongue every 5 (five) minutes as needed. For chest pain  05/25/11  Yes Jodelle Gross, NP  omeprazole (PRILOSEC) 40 MG capsule Take 40 mg by mouth daily.   Yes Historical Provider, MD  potassium chloride SA (K-DUR,KLOR-CON) 20 MEQ tablet Take 20 mEq by mouth daily.   Yes Historical Provider, MD  pravastatin (PRAVACHOL) 40 MG tablet  TAKE 1 TABLET (40 MG TOTAL) BY MOUTH AT BEDTIME. 03/18/13  Yes Jodelle Gross, NP  Salicylic Acid (SALEX) 6 % SHAM Apply 1 application topically once a week. For psoriasis   Yes Historical Provider, MD  warfarin (COUMADIN) 2.5 MG tablet Take 1 tablet daily except 1/2 tablets on Sundays 04/29/13  Yes Kathlen Brunswick, MD   BP 103/75  Pulse 39  Temp(Src) 97.6 F (36.4 C) (Oral)  Resp 15  SpO2 93% Physical Exam  Nursing note and vitals reviewed. Constitutional: She is oriented to person, place, and time. She appears well-developed and well-nourished. No distress.  HENT:  Head: Normocephalic  and atraumatic.  Mouth/Throat: Oropharynx is clear and moist. No oropharyngeal exudate.  Eyes: Conjunctivae are normal.  Right prosthetic eye  Neck: Normal range of motion. Neck supple.  No meningismus.  Cardiovascular: Normal rate, regular rhythm, normal heart sounds and intact distal pulses.   No murmur heard. Pulmonary/Chest: Effort normal and breath sounds normal. No respiratory distress.  Abdominal: Soft. There is no tenderness. There is no rebound and no guarding.  Musculoskeletal: Normal range of motion. She exhibits no edema and no tenderness.  no c-spine pain, no T or L spine pain, full ROM of left hip and knee without pain, intact DP and PT pulse, no shortening or external rotation  Neurological: She is alert and oriented to person, place, and time. No cranial nerve deficit. She exhibits normal muscle tone. Coordination normal.  No ataxia on finger to nose bilaterally. No pronator drift. 5/5 strength throughout. CN 2-12 intact. Negative Romberg. Equal grip strength. Sensation intact. Gait is assisted with walker  Skin: Skin is warm.  Psychiatric: She has a normal mood and affect.  Dementia    ED Course  Procedures (including critical care time) Labs Review Labs Reviewed - No data to display  Imaging Review Dg Pelvis 1-2 Views  07/03/2014   CLINICAL DATA:  Patient fell out of  chair.  EXAM: PELVIS - 1-2 VIEW  COMPARISON:  None.  FINDINGS: Aortoiliac atherosclerotic vascular disease. No acute bony abnormality identified. No evidence of fracture or dislocation.  IMPRESSION: 1. Aortoiliac atherosclerotic vascular disease. 2. Degenerative changes lumbar spine and both hips. No acute abnormality.   Electronically Signed   By: Maisie Fus  Register   On: 07/03/2014 10:30   Dg Femur Left  07/03/2014   CLINICAL DATA:  Patient fell out of chair.  EXAM: LEFT FEMUR - 2 VIEW  COMPARISON:  None.  FINDINGS: Degenerative changes noted left hip and knee. No acute bony or joint abnormality identified. Peripheral vascular disease.  IMPRESSION: Degenerative changes left hip and left knee. Peripheral vascular disease.   Electronically Signed   By: Maisie Fus  Register   On: 07/03/2014 10:32   Dg Tibia/fibula Left  07/03/2014   CLINICAL DATA:  Larey Seat out of a chair today.  EXAM: LEFT KNEE - COMPLETE 4+ VIEW; LEFT TIBIA AND FIBULA - 2 VIEW  COMPARISON:  08/15/2013  FINDINGS: Left knee:  The joint spaces are maintained. No acute fractures identified. No osteochondral abnormality. No definite joint effusion. Vascular calcifications are noted. Moderate osteoporosis.  Left tibia/ fibula:  Stable fibular hardware. No complicating features. The medial malleolar pins have been removed. No acute fracture of the tibia or fibula is identified.  IMPRESSION: No acute bony findings.   Electronically Signed   By: Loralie Champagne M.D.   On: 07/03/2014 10:34   Ct Head Wo Contrast  07/03/2014   CLINICAL DATA:  Status post fall last night ; history of cerebral vascular disease ; prostatic right globe  EXAM: CT HEAD WITHOUT CONTRAST  TECHNIQUE: Contiguous axial images were obtained from the base of the skull through the vertex without intravenous contrast.  COMPARISON:  Noncontrast CT scan of the brain of June 21, 2014  FINDINGS: There is moderate diffuse cerebral and cerebellar atrophy with compensatory ventriculomegaly.  There is no intracranial hemorrhage nor intracranial mass effect. There are stable parenchymal calcifications within the right parietal lobe. There is no acute ischemic change. The cerebellum and brainstem are unremarkable.  The observed paranasal sinuses and mastoid air cells are clear. There is no acute skull fracture.  IMPRESSION: 1. There is no acute intracranial hemorrhage nor evidence of acute ischemic change. 2. There is stable moderate diffuse cerebral and cerebellar atrophy with stable changes of chronic small vessel ischemia. There are stable parenchymal calcifications in the right parietal lobe. 3. There is no acute skull fracture.   Electronically Signed   By: David  SwazilandJordan   On: 07/03/2014 11:07   Dg Knee Complete 4 Views Left  07/03/2014   CLINICAL DATA:  Larey SeatFell out of a chair today.  EXAM: LEFT KNEE - COMPLETE 4+ VIEW; LEFT TIBIA AND FIBULA - 2 VIEW  COMPARISON:  08/15/2013  FINDINGS: Left knee:  The joint spaces are maintained. No acute fractures identified. No osteochondral abnormality. No definite joint effusion. Vascular calcifications are noted. Moderate osteoporosis.  Left tibia/ fibula:  Stable fibular hardware. No complicating features. The medial malleolar pins have been removed. No acute fracture of the tibia or fibula is identified.  IMPRESSION: No acute bony findings.   Electronically Signed   By: Loralie ChampagneMark  Gallerani M.D.   On: 07/03/2014 10:34     EKG Interpretation   Date/Time:  Thursday July 03 2014 11:48:03 EDT Ventricular Rate:  85 PR Interval:    QRS Duration: 138 QT Interval:  394 QTC Calculation: 468 R Axis:   99 Text Interpretation:  Atrial fibrillation RBBB and LPFB Repol abnrm  suggests ischemia, diffuse leads No significant change was found Confirmed  by Manus GunningANCOUR  MD, Mykayla Brinton ([id number]) on 07/03/2014 11:53:22 AM     DIAGNOSTIC STUDIES: Oxygen Saturation is 96% on room air, normal by my interpretation.    COORDINATION OF CARE: 9:33 AM Discussed treatment plan  with patient at beside, the patient agrees with the plan and has no further questions at this time.  11:06 AM She denies any pain at rest. She reports some pain with flexion of left lower extremity.   MDM   Final diagnoses:  Fall, initial encounter  Left leg pain   Patient slid out of chair last night and has been complaining of of left leg pain today in the knee area. Denies pain currently. Denies hitting head or losing consciousness. No obvious deformity.  X-rays of left leg are negative. CT head negative. INR 3 on 9/28 We'll attempt ambulation.  Patient is able to ambulate with a walker which is her baseline. She denies any pain. She appears to be at her baseline. Her son is comfortable taking her home. Return precautions discussed.   I personally performed the services described in this documentation, which was scribed in my presence. The recorded information has been reviewed and is accurate.   Angela OctaveStephen Remington Highbaugh, MD 07/03/14 [id number]

## 2014-07-03 NOTE — ED Notes (Signed)
Pt brought back to room. C/o pain to right upper thigh with movement/ambulation. No family with pt at present. Pt poor historian.

## 2014-07-03 NOTE — ED Notes (Signed)
Ambulated pt in hallway; pt walked with assistance and walker; pt's son states she [facility] [employer]

## 2014-07-03 NOTE — Discharge Instructions (Signed)
Fall Prevention and Home Safety Your Xrays are negative for fracture. Use tylenol as needed for pain. Return to the ED if you develop new or worsening symptoms. Falls cause injuries and can affect all age groups. It is possible to use preventive measures to significantly decrease the likelihood of falls. There are many simple measures which can make your home safer and prevent falls. OUTDOORS  Repair cracks and edges of walkways and driveways.  Remove high doorway thresholds.  Trim shrubbery on the main path into your home.  Have good outside lighting.  Clear walkways of tools, rocks, debris, and clutter.  Check that handrails are not broken and are securely fastened. Both sides of steps should have handrails.  Have leaves, snow, and ice cleared regularly.  Use sand or salt on walkways during winter months.  In the garage, clean up grease or oil spills. BATHROOM  Install night lights.  Install grab bars by the toilet and in the tub and shower.  Use non-skid mats or decals in the tub or shower.  Place a plastic non-slip stool in the shower to sit on, if needed.  Keep floors dry and clean up all water on the floor immediately.  Remove soap buildup in the tub or shower on a regular basis.  Secure bath mats with non-slip, double-sided rug tape.  Remove throw rugs and tripping hazards from the floors. BEDROOMS  Install night lights.  Make sure a bedside light is easy to reach.  Do not use oversized bedding.  Keep a telephone by your bedside.  Have a firm chair with side arms to use for getting dressed.  Remove throw rugs and tripping hazards from the floor. KITCHEN  Keep handles on pots and pans turned toward the center of the stove. Use back burners when possible.  Clean up spills quickly and allow time for drying.  Avoid walking on wet floors.  Avoid hot utensils and knives.  Position shelves so they are not too high or low.  Place commonly used objects  within easy reach.  If necessary, use a sturdy step stool with a grab bar when reaching.  Keep electrical cables out of the way.  Do not use floor polish or wax that makes floors slippery. If you must use wax, use non-skid floor wax.  Remove throw rugs and tripping hazards from the floor. STAIRWAYS  Never leave objects on stairs.  Place handrails on both sides of stairways and use them. Fix any loose handrails. Make sure handrails on both sides of the stairways are as long as the stairs.  Check carpeting to make sure it is firmly attached along stairs. Make repairs to worn or loose carpet promptly.  Avoid placing throw rugs at the top or bottom of stairways, or properly secure the rug with carpet tape to prevent slippage. Get rid of throw rugs, if possible.  Have an electrician put in a light switch at the top and bottom of the stairs. OTHER FALL PREVENTION TIPS  Wear low-heel or rubber-soled shoes that are supportive and fit well. Wear closed toe shoes.  When using a stepladder, make sure it is fully opened and both spreaders are firmly locked. Do not climb a closed stepladder.  Add color or contrast paint or tape to grab bars and handrails in your home. Place contrasting color strips on first and last steps.  Learn and use mobility aids as needed. Install an electrical emergency response system.  Turn on lights to avoid dark areas. Replace light  bulbs that burn out immediately. Get light switches that glow.  Arrange furniture to create clear pathways. Keep furniture in the same place.  Firmly attach carpet with non-skid or double-sided tape.  Eliminate uneven floor surfaces.  Select a carpet pattern that does not visually hide the edge of steps.  Be aware of all pets. OTHER HOME SAFETY TIPS  Set the water temperature for 120 F (48.8 C).  Keep emergency numbers on or near the telephone.  Keep smoke detectors on every level of the home and near sleeping  areas. Document Released: 09/09/2002 Document Revised: 03/20/2012 Document Reviewed: 12/09/2011 [employer] Patient Information 2015 [place], [place]. This information is not intended to replace advice given to you by your health care provider. Make sure you discuss any questions you have with your health care provider.

## 2014-07-04 ENCOUNTER — Emergency Department (HOSPITAL_COMMUNITY): Payer: PRIVATE HEALTH INSURANCE

## 2014-07-04 ENCOUNTER — Encounter (HOSPITAL_COMMUNITY): Payer: Self-pay | Admitting: Emergency Medicine

## 2014-07-04 ENCOUNTER — Emergency Department (HOSPITAL_COMMUNITY)
Admission: EM | Admit: 2014-07-04 | Discharge: 2014-07-04 | Disposition: A | Discharge status: Home / Self Care | Payer: PRIVATE HEALTH INSURANCE | Attending: Emergency Medicine | Admitting: Emergency Medicine

## 2014-07-04 DIAGNOSIS — M545 Low back pain: Secondary | ICD-10-CM | POA: Insufficient documentation

## 2014-07-04 DIAGNOSIS — I4891 Unspecified atrial fibrillation: Secondary | ICD-10-CM | POA: Diagnosis not present

## 2014-07-04 DIAGNOSIS — E785 Hyperlipidemia, unspecified: Secondary | ICD-10-CM | POA: Diagnosis not present

## 2014-07-04 DIAGNOSIS — Z7901 Long term (current) use of anticoagulants: Secondary | ICD-10-CM | POA: Diagnosis not present

## 2014-07-04 DIAGNOSIS — Z87891 Personal history of nicotine dependence: Secondary | ICD-10-CM | POA: Insufficient documentation

## 2014-07-04 DIAGNOSIS — K219 Gastro-esophageal reflux disease without esophagitis: Secondary | ICD-10-CM | POA: Diagnosis not present

## 2014-07-04 DIAGNOSIS — M549 Dorsalgia, unspecified: Secondary | ICD-10-CM | POA: Diagnosis present

## 2014-07-04 DIAGNOSIS — H9190 Unspecified hearing loss, unspecified ear: Secondary | ICD-10-CM | POA: Insufficient documentation

## 2014-07-04 DIAGNOSIS — Z79899 Other long term (current) drug therapy: Secondary | ICD-10-CM | POA: Insufficient documentation

## 2014-07-04 DIAGNOSIS — I2510 Atherosclerotic heart disease of native coronary artery without angina pectoris: Secondary | ICD-10-CM | POA: Insufficient documentation

## 2014-07-04 DIAGNOSIS — Z8673 Personal history of transient ischemic attack (TIA), and cerebral infarction without residual deficits: Secondary | ICD-10-CM | POA: Diagnosis not present

## 2014-07-04 DIAGNOSIS — G309 Alzheimer's disease, unspecified: Secondary | ICD-10-CM | POA: Diagnosis not present

## 2014-07-04 LAB — BASIC METABOLIC PANEL
ANION GAP: 11 (ref 5–15)
BUN: 15 mg/dL (ref 6–23)
CHLORIDE: 98 meq/L (ref 96–112)
CO2: 28 mEq/L (ref 19–32)
Calcium: 9 mg/dL (ref 8.4–10.5)
Creatinine, Ser: 1.22 mg/dL — ABNORMAL HIGH (ref 0.50–1.10)
GFR calc Af Amer: 44 mL/min — ABNORMAL LOW (ref >90)
GFR calc non Af Amer: 38 mL/min — ABNORMAL LOW (ref >90)
Glucose, Bld: 96 mg/dL (ref 70–99)
POTASSIUM: 4.4 meq/L (ref 3.7–5.3)
SODIUM: 137 meq/L (ref 137–147)

## 2014-07-04 LAB — CBC WITH DIFFERENTIAL/PLATELET
BASOS PCT: 0 % (ref 0–1)
Basophils Absolute: 0 10*3/uL (ref 0.0–0.1)
Eosinophils Absolute: 0.2 10*3/uL (ref 0.0–0.7)
Eosinophils Relative: 2 % (ref 0–5)
HCT: 41.6 % (ref 36.0–46.0)
HEMOGLOBIN: 13.5 g/dL (ref 12.0–15.0)
Lymphocytes Relative: 12 % (ref 12–46)
Lymphs Abs: 1.4 10*3/uL (ref 0.7–4.0)
MCH: 29.2 pg (ref 26.0–34.0)
MCHC: 32.5 g/dL (ref 30.0–36.0)
MCV: 90 fL (ref 78.0–100.0)
MONOS PCT: 12 % (ref 3–12)
Monocytes Absolute: 1.3 10*3/uL — ABNORMAL HIGH (ref 0.1–1.0)
NEUTROS ABS: 8.3 10*3/uL — AB (ref 1.7–7.7)
NEUTROS PCT: 74 % (ref 43–77)
PLATELETS: 211 10*3/uL (ref 150–400)
RBC: 4.62 MIL/uL (ref 3.87–5.11)
RDW: 13.7 % (ref 11.5–15.5)
WBC: 11.2 10*3/uL — ABNORMAL HIGH (ref 4.0–10.5)

## 2014-07-04 LAB — PROTIME-INR
INR: 3.3 — ABNORMAL HIGH (ref 0.00–1.49)
PROTHROMBIN TIME: 33.8 s — AB (ref 11.6–15.2)

## 2014-07-04 MED ORDER — TRAMADOL HCL 50 MG PO TABS
50.0000 mg | ORAL_TABLET | Freq: Four times a day (QID) | ORAL | Status: DC | PRN
Start: 2014-07-04 — End: 2015-02-18

## 2014-07-04 NOTE — Discharge Instructions (Signed)
Back Pain, Adult Low back pain is very common. About 1 in 5 people have back pain.The cause of low back pain is rarely dangerous. The pain often gets better over time.About half of people with a sudden onset of back pain feel better in just 2 weeks. About 8 in 10 people feel better by 6 weeks.  CAUSES Some common causes of back pain include:  Strain of the muscles or ligaments supporting the spine.  Wear and tear (degeneration) of the spinal discs.  Arthritis.  Direct injury to the back. DIAGNOSIS Most of the time, the direct cause of low back pain is not known.However, back pain can be treated effectively even when the exact cause of the pain is unknown.Answering your caregiver's questions about your overall health and symptoms is one of the most accurate ways to make sure the cause of your pain is not dangerous. If your caregiver needs more information, he or she may order lab work or imaging tests (X-rays or MRIs).However, even if imaging tests show changes in your back, this usually does not require surgery. HOME CARE INSTRUCTIONS For many people, back pain returns.Since low back pain is rarely dangerous, it is often a condition that people can learn to manageon their own.   Remain active. It is stressful on the back to sit or stand in one place. Do not sit, drive, or stand in one place for more than 30 minutes at a time. Take short walks on level surfaces as soon as pain allows.Try to increase the length of time you walk each day.  Do not stay in bed.Resting more than 1 or 2 days can delay your recovery.  Do not avoid exercise or work.Your body is made to move.It is not dangerous to be active, even though your back may hurt.Your back will likely heal faster if you return to being active before your pain is gone.  Pay attention to your body when you bend and lift. Many people have less discomfortwhen lifting if they bend their knees, keep the load close to their bodies,and  avoid twisting. Often, the most comfortable positions are those that put less stress on your recovering back.  Find a comfortable position to sleep. Use a firm mattress and lie on your side with your knees slightly bent. If you lie on your back, put a pillow under your knees.  Only take over-the-counter or prescription medicines as directed by your caregiver. Over-the-counter medicines to reduce pain and inflammation are often the most helpful.Your caregiver may prescribe muscle relaxant drugs.These medicines help dull your pain so you can more quickly return to your normal activities and healthy exercise.  Put ice on the injured area.  Put ice in a plastic bag.  Place a towel between your skin and the bag.  Leave the ice on for 15-20 minutes, 03-04 times a day for the first 2 to 3 days. After that, ice and heat may be alternated to reduce pain and spasms.  Ask your caregiver about trying back exercises and gentle massage. This may be of some benefit.  Avoid feeling anxious or stressed.Stress increases muscle tension and can worsen back pain.It is important to recognize when you are anxious or stressed and learn ways to manage it.Exercise is a great option. SEEK MEDICAL CARE IF:  You have pain that is not relieved with rest or medicine.  You have pain that does not improve in 1 week.  You have new symptoms.  You are generally not feeling well. SEEK   IMMEDIATE MEDICAL CARE IF:   You have pain that radiates from your back into your legs.  You develop new bowel or bladder control problems.  You have unusual weakness or numbness in your arms or legs.  You develop nausea or vomiting.  You develop abdominal pain.  You feel faint. Document Released: 09/19/2005 Document Revised: 03/20/2012 Document Reviewed: 01/21/2014 [employer] Patient Information 2015 [employer]. This information is not intended to replace advice given to you by your health care provider. Make sure you  discuss any questions you have with your health care provider.  

## 2014-07-04 NOTE — ED Provider Notes (Signed)
CSN: [id number]     Arrival date & time 07/04/14  1828 History   First MD Initiated Contact with Patient 07/04/14 1900     Chief Complaint  Patient presents with  . Back Pain     HPI Pt was sitting in a recliner and scooted off the end and fell to the ground.  This occurred 2 days ago.  She was seen at [facility] yesterday because since the fall she has been having pain on the right side.  This afternoon she is still having pain in her right leg, hip and tailbone.  She tried to stand up and she had to sit right back down.   Usually she can get around rather unsteadily using a walker.Family is concerned that something might have been missed.  Pt denies any pain at rest.  She has not been taking anything for pain at home.  Past Medical History  Diagnosis Date  . ASCVD (arteriosclerotic cardiovascular disease)     acute MI in 1993;40%left main,80%LAD, 100%RCA-PTCA; EF of 40%;stress nuclear in2007; normal EF;  mixed inferolateral ischemia and infarction; CHF with normal EF in 2011  . CHF (congestive heart failure)     normal EF;mixed inferolateral ischemic and infarction;chf with normal EF in 2011  . Hypertension   . Hyperlipidemia   . Cerebrovascular disease     CVA by CT in 2007,but not noted in 2009;duplex in 12/2006 plaque without stenosis; Emergency Room evaluation in 12/2010 for transient aphasia and paresis-clopidogrel initially added to ASA Rx, but subsequently warfarin was substituted for these 2 agents  . Tobacco abuse, in remission     quit in 1995  . Claudication   . Dementia     mild  . Kyphosis     severe  . Decreased hearing   . Atrial fibrillation   . Chronic anticoagulation   . TIA (transient ischemic attack)   . Renal disorder   . Glass eye     right-1990  . HOH (hard of hearing)   . Wears glasses   . GERD (gastroesophageal reflux disease)   . Alzheimer's dementia    Past Surgical History  Procedure Laterality Date  . Abdominal hysterectomy    .  Appendectomy    . Tonsillectomy    . Enucleation      Right-resulted from infection  . Hardware removal Left 08/16/2013    Procedure: REMOVAL 2 K WIRES FROM LEFT ANKLE. ;  Surgeon: Harvie JuniorJohn L Graves, MD;  Location: [facility];  Service: Orthopedics;  Laterality: Left;  REMOVAL 2 K WIRES FROM LEFT ANKLE.     Family History  Problem Relation Age of Onset  . Dementia Mother   . Atrial fibrillation Son   . Hypertension Son    History  Substance Use Topics  . Smoking status: Former Smoker    Start date: 06/06/1954    Quit date: 01/05/1994  . Smokeless tobacco: Never Used  . Alcohol Use: No   OB History   Grav Para Term Preterm Abortions TAB SAB Ect Mult Living                 Review of Systems  All other systems reviewed and are negative.     Allergies  Haldol and Sulfa antibiotics  Home Medications   Prior to Admission medications   Medication Sig Start Date End Date Taking? Authorizing Provider  albuterol (PROVENTIL) (2.5 MG/3ML) 0.083% nebulizer solution Take 2.5 mg by nebulization every 4 (four) hours as  needed. For shortness of breath/cough 08/09/12  Yes Historical Provider, MD  alendronate (FOSAMAX) 70 MG tablet Take 70 mg by mouth every 7 (seven) days. Take with a full glass of water on an empty stomach.    Yes Historical Provider, MD  cetirizine (ZYRTEC) 10 MG tablet Take 10 mg by mouth at bedtime.    Yes Historical Provider, MD  Cyanocobalamin (VITAMIN B-12) 5000 MCG SUBL Place 5,000 mcg under the tongue daily.   Yes Historical Provider, MD  donepezil (ARICEPT) 5 MG tablet Take 5 mg by mouth at bedtime.   Yes Historical Provider, MD  furosemide (LASIX) 40 MG tablet Take 40 mg by mouth as needed for fluid.    Yes Historical Provider, MD  lisinopril (PRINIVIL,ZESTRIL) 2.5 MG tablet Take 2.5 mg by mouth every morning.    Yes Historical Provider, MD  LORazepam (ATIVAN) 0.5 MG tablet Take 0.5 mg by mouth 3 (three) times daily. Anxiety and or agitation. *Last  dose is not to be given, if patient receives sleeping medication HALCION (TRIAZOLAM)*   Yes Historical Provider, MD  memantine (NAMENDA) 10 MG tablet Take 10 mg by mouth 2 (two) times daily.    Yes Historical Provider, MD  Multiple Vitamins-Minerals (MULTIVITAMIN WITH MINERALS) tablet Take 1 tablet by mouth every morning.    Yes Historical Provider, MD  nitroGLYCERIN (NITROSTAT) 0.4 MG SL tablet Place 0.4 mg under the tongue every 5 (five) minutes as needed. For chest pain  05/25/11  Yes Jodelle Gross, NP  omeprazole (PRILOSEC) 40 MG capsule Take 40 mg by mouth daily.   Yes Historical Provider, MD  potassium chloride SA (K-DUR,KLOR-CON) 20 MEQ tablet Take 20 mEq by mouth daily.   Yes Historical Provider, MD  risperiDONE (RISPERDAL) 0.5 MG tablet Take 0.5 mg by mouth at bedtime.   Yes Historical Provider, MD  Salicylic Acid (SALEX) 6 % SHAM Apply 1 application topically once a week. For psoriasis   Yes Historical Provider, MD  warfarin (COUMADIN) 2.5 MG tablet Take 2.5 mg by mouth See admin instructions. Take 1 tablet Monday-Saturday, and take .5 tablet on Sundays 04/29/13  Yes Kathlen Brunswick, MD  albuterol-ipratropium [facility]) 18-103 MCG/ACT inhaler Inhale 2 puffs into the lungs every 6 (six) hours as needed. For shortness of breath    Historical Provider, MD  traMADol (ULTRAM) 50 MG tablet Take 1 tablet (50 mg total) by mouth every 6 (six) hours as needed. 07/04/14   Linwood Dibbles, MD   BP 117/76  Pulse 68  Temp(Src) 98.6 F (37 C) (Oral)  Resp 18  SpO2 93% Physical Exam  Nursing note and vitals reviewed. Constitutional: No distress.  Elderly frail   HENT:  Head: Normocephalic and atraumatic.  Right Ear: External ear normal.  Left Ear: External ear normal.  Eyes: Conjunctivae are normal. Right eye exhibits no discharge. Left eye exhibits no discharge. No scleral icterus.  Neck: Neck supple. No tracheal deviation present.  Cardiovascular: Normal rate, regular rhythm and intact distal  pulses.   Pulmonary/Chest: Effort normal and breath sounds normal. No stridor. No respiratory distress. She has no wheezes. She has no rales.  Abdominal: Soft. Bowel sounds are normal. She exhibits no distension. There is no tenderness. There is no rebound and no guarding.  Musculoskeletal: She exhibits no edema.       Right hip: Normal.       Left hip: Normal.       Cervical back: Normal.       Thoracic back: She exhibits  tenderness. She exhibits no bony tenderness and no swelling.       Lumbar back: She exhibits tenderness. She exhibits normal range of motion, no bony tenderness and no swelling.  Neurological: She is alert. She has normal strength. No cranial nerve deficit (no facial droop, extraocular movements intact, no slurred speech) or sensory deficit. She exhibits normal muscle tone. She displays no seizure activity. Coordination normal.  Skin: Skin is warm and dry. No rash noted.  Psychiatric: She has a normal mood and affect.    ED Course  Procedures (including critical care time) Labs Review Labs Reviewed  CBC WITH DIFFERENTIAL - Abnormal; Notable for the following:    WBC 11.2 (*)    Neutro Abs 8.3 (*)    Monocytes Absolute 1.3 (*)    All other components within normal limits  BASIC METABOLIC PANEL - Abnormal; Notable for the following:    Creatinine, Ser 1.22 (*)    GFR calc non Af Amer 38 (*)    GFR calc Af Amer 44 (*)    All other components within normal limits  PROTIME-INR - Abnormal; Notable for the following:    Prothrombin Time 33.8 (*)    INR 3.30 (*)    All other components within normal limits    Imaging Review Dg Thoracic Spine 2 View  07/04/2014   CLINICAL DATA:  Fall with back pain.  EXAM: THORACIC SPINE - 2 VIEW  COMPARISON:  None.  FINDINGS: There is levoconvex curvature of the thoracic spine, some of which may be positional. Alignment is anatomic, including at the cervicothoracic junction. Kyphosis in the thoracic spine. Osteopenia limits evaluation.  There are multiple compression deformities in the thoracic spine, similar to the prior exam.  IMPRESSION: Kyphosis, multilevel spondylosis and scattered compression deformities, likely stable.   Electronically Signed   By: Leanna Battles M.D.   On: 07/04/2014 20:11   Dg Lumbar Spine Complete  07/04/2014   CLINICAL DATA:  Fall with acute pain all over.  Back pain.  EXAM: LUMBAR SPINE - COMPLETE 4+ VIEW  COMPARISON:  05/07/2010.  FINDINGS: Mild levoconvex curvature of the lumbar spine. Osteopenia. Alignment is otherwise anatomic. Vertebral body height is maintained. Multilevel endplate degenerative changes. Findings are worst at L5-S1, where there is also loss of disc space height and facet hypertrophy. Atherosclerotic calcification of the arterial vasculature. Abdominal aorta measures up to approximately 3.2 cm. Stippled calcification in the right abdomen is unchanged from the prior exam.  IMPRESSION: 1. Osteopenia and multilevel spondylosis. No evidence of acute trauma. 2. Suspect small abdominal aortic aneurysm.   Electronically Signed   By: Leanna Battles M.D.   On: 07/04/2014 20:08   Dg Pelvis 1-2 Views  07/03/2014   CLINICAL DATA:  Patient fell out of chair.  EXAM: PELVIS - 1-2 VIEW  COMPARISON:  None.  FINDINGS: Aortoiliac atherosclerotic vascular disease. No acute bony abnormality identified. No evidence of fracture or dislocation.  IMPRESSION: 1. Aortoiliac atherosclerotic vascular disease. 2. Degenerative changes lumbar spine and both hips. No acute abnormality.   Electronically Signed   By: Maisie Fus  Register   On: 07/03/2014 10:30   Dg Hip Bilateral W/pelvis  07/04/2014   CLINICAL DATA:  Fall with diffuse pelvic pain.  EXAM: BILATERAL HIP WITH PELVIS - 4+ VIEW  COMPARISON:  06/21/2014 and 09/05/2012  FINDINGS: There is diffuse decreased bone mineralization present. There are mild symmetric degenerative changes of the hips. There are degenerative changes of the spine and symphysis pubis joint.  There is  no acute fracture or dislocation. 2.7 cm group of flocculent calcification over the right abdomen unchanged. The  IMPRESSION: No acute fracture.   Electronically Signed   By: Elberta Fortis M.D.   On: 07/04/2014 20:13   Dg Femur Left  07/03/2014   CLINICAL DATA:  Patient fell out of chair.  EXAM: LEFT FEMUR - 2 VIEW  COMPARISON:  None.  FINDINGS: Degenerative changes noted left hip and knee. No acute bony or joint abnormality identified. Peripheral vascular disease.  IMPRESSION: Degenerative changes left hip and left knee. Peripheral vascular disease.   Electronically Signed   By: Maisie Fus  Register   On: 07/03/2014 10:32   Dg Tibia/fibula Left  07/03/2014   CLINICAL DATA:  Larey Seat out of a chair today.  EXAM: LEFT KNEE - COMPLETE 4+ VIEW; LEFT TIBIA AND FIBULA - 2 VIEW  COMPARISON:  08/15/2013  FINDINGS: Left knee:  The joint spaces are maintained. No acute fractures identified. No osteochondral abnormality. No definite joint effusion. Vascular calcifications are noted. Moderate osteoporosis.  Left tibia/ fibula:  Stable fibular hardware. No complicating features. The medial malleolar pins have been removed. No acute fracture of the tibia or fibula is identified.  IMPRESSION: No acute bony findings.   Electronically Signed   By: Loralie Champagne M.D.   On: 07/03/2014 10:34   Ct Head Wo Contrast  07/03/2014   CLINICAL DATA:  Status post fall last night ; history of cerebral vascular disease ; prostatic right globe  EXAM: CT HEAD WITHOUT CONTRAST  TECHNIQUE: Contiguous axial images were obtained from the base of the skull through the vertex without intravenous contrast.  COMPARISON:  Noncontrast CT scan of the brain of June 21, 2014  FINDINGS: There is moderate diffuse cerebral and cerebellar atrophy with compensatory ventriculomegaly. There is no intracranial hemorrhage nor intracranial mass effect. There are stable parenchymal calcifications within the right parietal lobe. There is no acute ischemic  change. The cerebellum and brainstem are unremarkable.  The observed paranasal sinuses and mastoid air cells are clear. There is no acute skull fracture.  IMPRESSION: 1. There is no acute intracranial hemorrhage nor evidence of acute ischemic change. 2. There is stable moderate diffuse cerebral and cerebellar atrophy with stable changes of chronic small vessel ischemia. There are stable parenchymal calcifications in the right parietal lobe. 3. There is no acute skull fracture.   Electronically Signed   By: David  [place]   On: 07/03/2014 11:07   Dg Knee Complete 4 Views Left  07/03/2014   CLINICAL DATA:  Larey Seat out of a chair today.  EXAM: LEFT KNEE - COMPLETE 4+ VIEW; LEFT TIBIA AND FIBULA - 2 VIEW  COMPARISON:  08/15/2013  FINDINGS: Left knee:  The joint spaces are maintained. No acute fractures identified. No osteochondral abnormality. No definite joint effusion. Vascular calcifications are noted. Moderate osteoporosis.  Left tibia/ fibula:  Stable fibular hardware. No complicating features. The medial malleolar pins have been removed. No acute fracture of the tibia or fibula is identified.  IMPRESSION: No acute bony findings.   Electronically Signed   By: Loralie Champagne M.D.   On: 07/03/2014 10:34     MDM   Final diagnoses:  Low back pain without sciatica, unspecified back pain laterality    Pt was able to get up and walk in the ED.  She was able to bear weight without discomfort.  She did complain of some pain after lying back down.  Cannot completely exclude occult fracture but over all doubt subtle  fracture.  Doubt hip injury as patient is able to stand without pain.  Will dc home with pain meds.  Discussed findings with family.  Follow up with pcp   Linwood Dibbles, MD 07/04/14 2136

## 2014-07-04 NOTE — ED Notes (Signed)
Bed: WA16 Expected date:  Expected time:  Means of arrival:  Comments: EMS-back pain 

## 2014-07-04 NOTE — ED Notes (Signed)
Patient transported to X-ray 

## 2014-07-04 NOTE — ED Notes (Signed)
Per EMS-patient fell on the 15th and 30th of Sept-was seen, evaluated, and [facility]-Family wants her reevaluated her-thinks [facility] something

## 2014-07-04 NOTE — ED Notes (Signed)
Patient arrives via EMS due to recent multiple falls at home Patient states that falls are r/t to "tripping over things." As previously documented, patient seen, [facility] and [name] Patient's family wanted patient brought here for further eval Patient denies c/o pain or needs at this time Side rails up, call bell in reach

## 2014-07-28 ENCOUNTER — Ambulatory Visit (INDEPENDENT_AMBULATORY_CARE_PROVIDER_SITE_OTHER): Payer: PRIVATE HEALTH INSURANCE | Admitting: *Deleted

## 2014-07-28 DIAGNOSIS — Z7901 Long term (current) use of anticoagulants: Secondary | ICD-10-CM

## 2014-07-28 DIAGNOSIS — Z5181 Encounter for therapeutic drug level monitoring: Secondary | ICD-10-CM

## 2014-07-28 DIAGNOSIS — I480 Paroxysmal atrial fibrillation: Secondary | ICD-10-CM

## 2014-07-28 DIAGNOSIS — I4891 Unspecified atrial fibrillation: Secondary | ICD-10-CM

## 2014-07-28 LAB — POCT INR: INR: 2.9

## 2014-09-08 ENCOUNTER — Encounter: Payer: Self-pay | Admitting: *Deleted

## 2014-09-10 ENCOUNTER — Ambulatory Visit (INDEPENDENT_AMBULATORY_CARE_PROVIDER_SITE_OTHER): Payer: PRIVATE HEALTH INSURANCE | Admitting: *Deleted

## 2014-09-10 DIAGNOSIS — I480 Paroxysmal atrial fibrillation: Secondary | ICD-10-CM

## 2014-09-10 DIAGNOSIS — Z5181 Encounter for therapeutic drug level monitoring: Secondary | ICD-10-CM

## 2014-09-10 DIAGNOSIS — Z7901 Long term (current) use of anticoagulants: Secondary | ICD-10-CM

## 2014-09-10 DIAGNOSIS — I4891 Unspecified atrial fibrillation: Secondary | ICD-10-CM

## 2014-09-10 LAB — POCT INR: INR: 3

## 2014-10-03 DIAGNOSIS — J449 Chronic obstructive pulmonary disease, unspecified: Secondary | ICD-10-CM | POA: Diagnosis not present

## 2014-10-03 DIAGNOSIS — R0902 Hypoxemia: Secondary | ICD-10-CM | POA: Diagnosis not present

## 2014-10-03 DIAGNOSIS — I482 Chronic atrial fibrillation: Secondary | ICD-10-CM | POA: Diagnosis not present

## 2014-10-04 DIAGNOSIS — I482 Chronic atrial fibrillation: Secondary | ICD-10-CM | POA: Diagnosis not present

## 2014-10-04 DIAGNOSIS — R0902 Hypoxemia: Secondary | ICD-10-CM | POA: Diagnosis not present

## 2014-10-04 DIAGNOSIS — J449 Chronic obstructive pulmonary disease, unspecified: Secondary | ICD-10-CM | POA: Diagnosis not present

## 2014-10-05 DIAGNOSIS — G309 Alzheimer's disease, unspecified: Secondary | ICD-10-CM | POA: Diagnosis not present

## 2014-10-05 DIAGNOSIS — F0280 Dementia in other diseases classified elsewhere without behavioral disturbance: Secondary | ICD-10-CM | POA: Diagnosis not present

## 2014-10-08 ENCOUNTER — Ambulatory Visit (INDEPENDENT_AMBULATORY_CARE_PROVIDER_SITE_OTHER): Payer: Medicare Other | Admitting: *Deleted

## 2014-10-08 DIAGNOSIS — I480 Paroxysmal atrial fibrillation: Secondary | ICD-10-CM

## 2014-10-08 DIAGNOSIS — Z5181 Encounter for therapeutic drug level monitoring: Secondary | ICD-10-CM

## 2014-10-08 DIAGNOSIS — Z7901 Long term (current) use of anticoagulants: Secondary | ICD-10-CM | POA: Diagnosis not present

## 2014-10-08 DIAGNOSIS — I4891 Unspecified atrial fibrillation: Secondary | ICD-10-CM | POA: Diagnosis not present

## 2014-10-08 LAB — POCT INR: INR: 2

## 2014-10-22 DIAGNOSIS — R32 Unspecified urinary incontinence: Secondary | ICD-10-CM | POA: Diagnosis not present

## 2014-11-03 DIAGNOSIS — I482 Chronic atrial fibrillation: Secondary | ICD-10-CM | POA: Diagnosis not present

## 2014-11-03 DIAGNOSIS — J449 Chronic obstructive pulmonary disease, unspecified: Secondary | ICD-10-CM | POA: Diagnosis not present

## 2014-11-03 DIAGNOSIS — R0902 Hypoxemia: Secondary | ICD-10-CM | POA: Diagnosis not present

## 2014-11-04 DIAGNOSIS — R0902 Hypoxemia: Secondary | ICD-10-CM | POA: Diagnosis not present

## 2014-11-04 DIAGNOSIS — J449 Chronic obstructive pulmonary disease, unspecified: Secondary | ICD-10-CM | POA: Diagnosis not present

## 2014-11-04 DIAGNOSIS — I482 Chronic atrial fibrillation: Secondary | ICD-10-CM | POA: Diagnosis not present

## 2014-11-05 DIAGNOSIS — Z4420 Encounter for fitting and adjustment of artificial eye, unspecified: Secondary | ICD-10-CM | POA: Diagnosis not present

## 2014-11-19 ENCOUNTER — Ambulatory Visit (INDEPENDENT_AMBULATORY_CARE_PROVIDER_SITE_OTHER): Payer: Medicare Other | Admitting: *Deleted

## 2014-11-19 DIAGNOSIS — Z5181 Encounter for therapeutic drug level monitoring: Secondary | ICD-10-CM

## 2014-11-19 DIAGNOSIS — Z7901 Long term (current) use of anticoagulants: Secondary | ICD-10-CM

## 2014-11-19 DIAGNOSIS — I480 Paroxysmal atrial fibrillation: Secondary | ICD-10-CM

## 2014-11-19 DIAGNOSIS — I4891 Unspecified atrial fibrillation: Secondary | ICD-10-CM

## 2014-11-19 LAB — POCT INR: INR: 2.6

## 2014-11-21 DIAGNOSIS — J449 Chronic obstructive pulmonary disease, unspecified: Secondary | ICD-10-CM | POA: Diagnosis not present

## 2014-12-01 DIAGNOSIS — J449 Chronic obstructive pulmonary disease, unspecified: Secondary | ICD-10-CM | POA: Diagnosis not present

## 2014-12-01 DIAGNOSIS — F0280 Dementia in other diseases classified elsewhere without behavioral disturbance: Secondary | ICD-10-CM | POA: Diagnosis not present

## 2014-12-01 DIAGNOSIS — I509 Heart failure, unspecified: Secondary | ICD-10-CM | POA: Diagnosis not present

## 2014-12-01 DIAGNOSIS — G309 Alzheimer's disease, unspecified: Secondary | ICD-10-CM | POA: Diagnosis not present

## 2014-12-01 DIAGNOSIS — Z8673 Personal history of transient ischemic attack (TIA), and cerebral infarction without residual deficits: Secondary | ICD-10-CM | POA: Diagnosis not present

## 2014-12-01 DIAGNOSIS — R32 Unspecified urinary incontinence: Secondary | ICD-10-CM | POA: Diagnosis not present

## 2014-12-01 DIAGNOSIS — H3530 Unspecified macular degeneration: Secondary | ICD-10-CM | POA: Diagnosis not present

## 2014-12-01 DIAGNOSIS — I2510 Atherosclerotic heart disease of native coronary artery without angina pectoris: Secondary | ICD-10-CM | POA: Diagnosis not present

## 2014-12-01 DIAGNOSIS — I4891 Unspecified atrial fibrillation: Secondary | ICD-10-CM | POA: Diagnosis not present

## 2014-12-02 DIAGNOSIS — I10 Essential (primary) hypertension: Secondary | ICD-10-CM | POA: Diagnosis not present

## 2014-12-02 DIAGNOSIS — J209 Acute bronchitis, unspecified: Secondary | ICD-10-CM | POA: Diagnosis not present

## 2014-12-02 DIAGNOSIS — J449 Chronic obstructive pulmonary disease, unspecified: Secondary | ICD-10-CM | POA: Diagnosis not present

## 2014-12-02 DIAGNOSIS — J0100 Acute maxillary sinusitis, unspecified: Secondary | ICD-10-CM | POA: Diagnosis not present

## 2014-12-03 DIAGNOSIS — R0902 Hypoxemia: Secondary | ICD-10-CM | POA: Diagnosis not present

## 2014-12-03 DIAGNOSIS — I482 Chronic atrial fibrillation: Secondary | ICD-10-CM | POA: Diagnosis not present

## 2014-12-03 DIAGNOSIS — J449 Chronic obstructive pulmonary disease, unspecified: Secondary | ICD-10-CM | POA: Diagnosis not present

## 2014-12-04 DIAGNOSIS — G309 Alzheimer's disease, unspecified: Secondary | ICD-10-CM | POA: Diagnosis not present

## 2014-12-04 DIAGNOSIS — I509 Heart failure, unspecified: Secondary | ICD-10-CM | POA: Diagnosis not present

## 2014-12-04 DIAGNOSIS — F0280 Dementia in other diseases classified elsewhere without behavioral disturbance: Secondary | ICD-10-CM | POA: Diagnosis not present

## 2014-12-04 DIAGNOSIS — I4891 Unspecified atrial fibrillation: Secondary | ICD-10-CM | POA: Diagnosis not present

## 2014-12-22 DIAGNOSIS — R32 Unspecified urinary incontinence: Secondary | ICD-10-CM | POA: Diagnosis not present

## 2014-12-31 ENCOUNTER — Ambulatory Visit (INDEPENDENT_AMBULATORY_CARE_PROVIDER_SITE_OTHER): Payer: Medicare Other | Admitting: *Deleted

## 2014-12-31 DIAGNOSIS — I4891 Unspecified atrial fibrillation: Secondary | ICD-10-CM

## 2014-12-31 DIAGNOSIS — Z7901 Long term (current) use of anticoagulants: Secondary | ICD-10-CM

## 2014-12-31 DIAGNOSIS — I480 Paroxysmal atrial fibrillation: Secondary | ICD-10-CM

## 2014-12-31 DIAGNOSIS — Z5181 Encounter for therapeutic drug level monitoring: Secondary | ICD-10-CM | POA: Diagnosis not present

## 2014-12-31 LAB — POCT INR: INR: 4.3

## 2015-01-03 DIAGNOSIS — I482 Chronic atrial fibrillation: Secondary | ICD-10-CM | POA: Diagnosis not present

## 2015-01-03 DIAGNOSIS — R0902 Hypoxemia: Secondary | ICD-10-CM | POA: Diagnosis not present

## 2015-01-03 DIAGNOSIS — J449 Chronic obstructive pulmonary disease, unspecified: Secondary | ICD-10-CM | POA: Diagnosis not present

## 2015-01-07 DIAGNOSIS — J449 Chronic obstructive pulmonary disease, unspecified: Secondary | ICD-10-CM | POA: Diagnosis not present

## 2015-01-07 DIAGNOSIS — G309 Alzheimer's disease, unspecified: Secondary | ICD-10-CM | POA: Diagnosis not present

## 2015-01-07 DIAGNOSIS — H3530 Unspecified macular degeneration: Secondary | ICD-10-CM | POA: Diagnosis not present

## 2015-01-07 DIAGNOSIS — I4891 Unspecified atrial fibrillation: Secondary | ICD-10-CM | POA: Diagnosis not present

## 2015-01-07 DIAGNOSIS — Z8673 Personal history of transient ischemic attack (TIA), and cerebral infarction without residual deficits: Secondary | ICD-10-CM | POA: Diagnosis not present

## 2015-01-07 DIAGNOSIS — F0280 Dementia in other diseases classified elsewhere without behavioral disturbance: Secondary | ICD-10-CM | POA: Diagnosis not present

## 2015-01-07 DIAGNOSIS — I2510 Atherosclerotic heart disease of native coronary artery without angina pectoris: Secondary | ICD-10-CM | POA: Diagnosis not present

## 2015-01-07 DIAGNOSIS — I509 Heart failure, unspecified: Secondary | ICD-10-CM | POA: Diagnosis not present

## 2015-01-07 DIAGNOSIS — R32 Unspecified urinary incontinence: Secondary | ICD-10-CM | POA: Diagnosis not present

## 2015-01-07 DIAGNOSIS — L89322 Pressure ulcer of left buttock, stage 2: Secondary | ICD-10-CM | POA: Diagnosis not present

## 2015-01-08 DIAGNOSIS — G309 Alzheimer's disease, unspecified: Secondary | ICD-10-CM | POA: Diagnosis not present

## 2015-01-14 ENCOUNTER — Ambulatory Visit (INDEPENDENT_AMBULATORY_CARE_PROVIDER_SITE_OTHER): Payer: Medicare Other | Admitting: *Deleted

## 2015-01-14 DIAGNOSIS — I509 Heart failure, unspecified: Secondary | ICD-10-CM | POA: Diagnosis not present

## 2015-01-14 DIAGNOSIS — G309 Alzheimer's disease, unspecified: Secondary | ICD-10-CM | POA: Diagnosis not present

## 2015-01-14 DIAGNOSIS — Z5181 Encounter for therapeutic drug level monitoring: Secondary | ICD-10-CM | POA: Diagnosis not present

## 2015-01-14 DIAGNOSIS — Z7901 Long term (current) use of anticoagulants: Secondary | ICD-10-CM | POA: Diagnosis not present

## 2015-01-14 DIAGNOSIS — I2510 Atherosclerotic heart disease of native coronary artery without angina pectoris: Secondary | ICD-10-CM | POA: Diagnosis not present

## 2015-01-14 DIAGNOSIS — I4891 Unspecified atrial fibrillation: Secondary | ICD-10-CM | POA: Diagnosis not present

## 2015-01-14 DIAGNOSIS — F0280 Dementia in other diseases classified elsewhere without behavioral disturbance: Secondary | ICD-10-CM | POA: Diagnosis not present

## 2015-01-14 DIAGNOSIS — I480 Paroxysmal atrial fibrillation: Secondary | ICD-10-CM

## 2015-01-14 DIAGNOSIS — J449 Chronic obstructive pulmonary disease, unspecified: Secondary | ICD-10-CM | POA: Diagnosis not present

## 2015-01-14 DIAGNOSIS — Z8673 Personal history of transient ischemic attack (TIA), and cerebral infarction without residual deficits: Secondary | ICD-10-CM | POA: Diagnosis not present

## 2015-01-14 DIAGNOSIS — H3530 Unspecified macular degeneration: Secondary | ICD-10-CM | POA: Diagnosis not present

## 2015-01-14 DIAGNOSIS — R32 Unspecified urinary incontinence: Secondary | ICD-10-CM | POA: Diagnosis not present

## 2015-01-14 LAB — POCT INR: INR: 3.1

## 2015-01-21 DIAGNOSIS — I509 Heart failure, unspecified: Secondary | ICD-10-CM | POA: Diagnosis not present

## 2015-01-21 DIAGNOSIS — I4891 Unspecified atrial fibrillation: Secondary | ICD-10-CM | POA: Diagnosis not present

## 2015-01-21 DIAGNOSIS — G309 Alzheimer's disease, unspecified: Secondary | ICD-10-CM | POA: Diagnosis not present

## 2015-01-21 DIAGNOSIS — I2510 Atherosclerotic heart disease of native coronary artery without angina pectoris: Secondary | ICD-10-CM | POA: Diagnosis not present

## 2015-01-21 DIAGNOSIS — H3530 Unspecified macular degeneration: Secondary | ICD-10-CM | POA: Diagnosis not present

## 2015-01-21 DIAGNOSIS — R32 Unspecified urinary incontinence: Secondary | ICD-10-CM | POA: Diagnosis not present

## 2015-01-21 DIAGNOSIS — J449 Chronic obstructive pulmonary disease, unspecified: Secondary | ICD-10-CM | POA: Diagnosis not present

## 2015-01-21 DIAGNOSIS — F0280 Dementia in other diseases classified elsewhere without behavioral disturbance: Secondary | ICD-10-CM | POA: Diagnosis not present

## 2015-01-21 DIAGNOSIS — Z8673 Personal history of transient ischemic attack (TIA), and cerebral infarction without residual deficits: Secondary | ICD-10-CM | POA: Diagnosis not present

## 2015-01-30 DIAGNOSIS — F0280 Dementia in other diseases classified elsewhere without behavioral disturbance: Secondary | ICD-10-CM | POA: Diagnosis not present

## 2015-01-30 DIAGNOSIS — J449 Chronic obstructive pulmonary disease, unspecified: Secondary | ICD-10-CM | POA: Diagnosis not present

## 2015-01-30 DIAGNOSIS — H3530 Unspecified macular degeneration: Secondary | ICD-10-CM | POA: Diagnosis not present

## 2015-01-30 DIAGNOSIS — I2510 Atherosclerotic heart disease of native coronary artery without angina pectoris: Secondary | ICD-10-CM | POA: Diagnosis not present

## 2015-01-30 DIAGNOSIS — I509 Heart failure, unspecified: Secondary | ICD-10-CM | POA: Diagnosis not present

## 2015-01-30 DIAGNOSIS — I4891 Unspecified atrial fibrillation: Secondary | ICD-10-CM | POA: Diagnosis not present

## 2015-01-30 DIAGNOSIS — Z8673 Personal history of transient ischemic attack (TIA), and cerebral infarction without residual deficits: Secondary | ICD-10-CM | POA: Diagnosis not present

## 2015-01-30 DIAGNOSIS — G309 Alzheimer's disease, unspecified: Secondary | ICD-10-CM | POA: Diagnosis not present

## 2015-01-30 DIAGNOSIS — R32 Unspecified urinary incontinence: Secondary | ICD-10-CM | POA: Diagnosis not present

## 2015-02-02 DIAGNOSIS — I4891 Unspecified atrial fibrillation: Secondary | ICD-10-CM | POA: Diagnosis not present

## 2015-02-02 DIAGNOSIS — F0280 Dementia in other diseases classified elsewhere without behavioral disturbance: Secondary | ICD-10-CM | POA: Diagnosis not present

## 2015-02-02 DIAGNOSIS — J449 Chronic obstructive pulmonary disease, unspecified: Secondary | ICD-10-CM | POA: Diagnosis not present

## 2015-02-02 DIAGNOSIS — G309 Alzheimer's disease, unspecified: Secondary | ICD-10-CM | POA: Diagnosis not present

## 2015-02-02 DIAGNOSIS — I482 Chronic atrial fibrillation: Secondary | ICD-10-CM | POA: Diagnosis not present

## 2015-02-02 DIAGNOSIS — R0902 Hypoxemia: Secondary | ICD-10-CM | POA: Diagnosis not present

## 2015-02-02 DIAGNOSIS — I509 Heart failure, unspecified: Secondary | ICD-10-CM | POA: Diagnosis not present

## 2015-02-04 ENCOUNTER — Ambulatory Visit (INDEPENDENT_AMBULATORY_CARE_PROVIDER_SITE_OTHER): Payer: Medicare Other | Admitting: *Deleted

## 2015-02-04 DIAGNOSIS — Z7901 Long term (current) use of anticoagulants: Secondary | ICD-10-CM | POA: Diagnosis not present

## 2015-02-04 DIAGNOSIS — L89322 Pressure ulcer of left buttock, stage 2: Secondary | ICD-10-CM | POA: Diagnosis not present

## 2015-02-04 DIAGNOSIS — Z5181 Encounter for therapeutic drug level monitoring: Secondary | ICD-10-CM | POA: Diagnosis not present

## 2015-02-04 DIAGNOSIS — J449 Chronic obstructive pulmonary disease, unspecified: Secondary | ICD-10-CM | POA: Diagnosis not present

## 2015-02-04 DIAGNOSIS — I480 Paroxysmal atrial fibrillation: Secondary | ICD-10-CM | POA: Diagnosis not present

## 2015-02-04 DIAGNOSIS — G309 Alzheimer's disease, unspecified: Secondary | ICD-10-CM | POA: Diagnosis not present

## 2015-02-04 DIAGNOSIS — R32 Unspecified urinary incontinence: Secondary | ICD-10-CM | POA: Diagnosis not present

## 2015-02-04 DIAGNOSIS — F0280 Dementia in other diseases classified elsewhere without behavioral disturbance: Secondary | ICD-10-CM | POA: Diagnosis not present

## 2015-02-04 DIAGNOSIS — Z8673 Personal history of transient ischemic attack (TIA), and cerebral infarction without residual deficits: Secondary | ICD-10-CM | POA: Diagnosis not present

## 2015-02-04 DIAGNOSIS — H3530 Unspecified macular degeneration: Secondary | ICD-10-CM | POA: Diagnosis not present

## 2015-02-04 DIAGNOSIS — I509 Heart failure, unspecified: Secondary | ICD-10-CM | POA: Diagnosis not present

## 2015-02-04 DIAGNOSIS — I4891 Unspecified atrial fibrillation: Secondary | ICD-10-CM | POA: Diagnosis not present

## 2015-02-04 DIAGNOSIS — I2510 Atherosclerotic heart disease of native coronary artery without angina pectoris: Secondary | ICD-10-CM | POA: Diagnosis not present

## 2015-02-04 LAB — POCT INR: INR: 2.7

## 2015-02-05 DIAGNOSIS — G309 Alzheimer's disease, unspecified: Secondary | ICD-10-CM | POA: Diagnosis not present

## 2015-02-11 DIAGNOSIS — J449 Chronic obstructive pulmonary disease, unspecified: Secondary | ICD-10-CM | POA: Diagnosis not present

## 2015-02-11 DIAGNOSIS — I509 Heart failure, unspecified: Secondary | ICD-10-CM | POA: Diagnosis not present

## 2015-02-11 DIAGNOSIS — L89322 Pressure ulcer of left buttock, stage 2: Secondary | ICD-10-CM | POA: Diagnosis not present

## 2015-02-11 DIAGNOSIS — I2510 Atherosclerotic heart disease of native coronary artery without angina pectoris: Secondary | ICD-10-CM | POA: Diagnosis not present

## 2015-02-11 DIAGNOSIS — G309 Alzheimer's disease, unspecified: Secondary | ICD-10-CM | POA: Diagnosis not present

## 2015-02-11 DIAGNOSIS — H3530 Unspecified macular degeneration: Secondary | ICD-10-CM | POA: Diagnosis not present

## 2015-02-11 DIAGNOSIS — F0280 Dementia in other diseases classified elsewhere without behavioral disturbance: Secondary | ICD-10-CM | POA: Diagnosis not present

## 2015-02-11 DIAGNOSIS — I4891 Unspecified atrial fibrillation: Secondary | ICD-10-CM | POA: Diagnosis not present

## 2015-02-11 DIAGNOSIS — R32 Unspecified urinary incontinence: Secondary | ICD-10-CM | POA: Diagnosis not present

## 2015-02-11 DIAGNOSIS — Z8673 Personal history of transient ischemic attack (TIA), and cerebral infarction without residual deficits: Secondary | ICD-10-CM | POA: Diagnosis not present

## 2015-02-15 ENCOUNTER — Emergency Department (HOSPITAL_COMMUNITY): Payer: Medicare Other

## 2015-02-15 ENCOUNTER — Observation Stay (HOSPITAL_COMMUNITY)
Admission: EM | Admit: 2015-02-15 | Discharge: 2015-02-18 | Disposition: A | Discharge status: Home / Self Care | Payer: Medicare Other | Attending: Internal Medicine | Admitting: Internal Medicine

## 2015-02-15 ENCOUNTER — Encounter (HOSPITAL_COMMUNITY): Payer: Self-pay

## 2015-02-15 DIAGNOSIS — R262 Difficulty in walking, not elsewhere classified: Secondary | ICD-10-CM | POA: Diagnosis not present

## 2015-02-15 DIAGNOSIS — Z79899 Other long term (current) drug therapy: Secondary | ICD-10-CM | POA: Insufficient documentation

## 2015-02-15 DIAGNOSIS — Z8673 Personal history of transient ischemic attack (TIA), and cerebral infarction without residual deficits: Secondary | ICD-10-CM | POA: Insufficient documentation

## 2015-02-15 DIAGNOSIS — R404 Transient alteration of awareness: Secondary | ICD-10-CM

## 2015-02-15 DIAGNOSIS — J449 Chronic obstructive pulmonary disease, unspecified: Secondary | ICD-10-CM | POA: Insufficient documentation

## 2015-02-15 DIAGNOSIS — F0280 Dementia in other diseases classified elsewhere without behavioral disturbance: Secondary | ICD-10-CM | POA: Insufficient documentation

## 2015-02-15 DIAGNOSIS — M1990 Unspecified osteoarthritis, unspecified site: Secondary | ICD-10-CM | POA: Diagnosis not present

## 2015-02-15 DIAGNOSIS — E785 Hyperlipidemia, unspecified: Secondary | ICD-10-CM | POA: Diagnosis not present

## 2015-02-15 DIAGNOSIS — N289 Disorder of kidney and ureter, unspecified: Secondary | ICD-10-CM | POA: Insufficient documentation

## 2015-02-15 DIAGNOSIS — I679 Cerebrovascular disease, unspecified: Secondary | ICD-10-CM | POA: Insufficient documentation

## 2015-02-15 DIAGNOSIS — I4891 Unspecified atrial fibrillation: Secondary | ICD-10-CM | POA: Insufficient documentation

## 2015-02-15 DIAGNOSIS — G309 Alzheimer's disease, unspecified: Secondary | ICD-10-CM | POA: Insufficient documentation

## 2015-02-15 DIAGNOSIS — R401 Stupor: Secondary | ICD-10-CM | POA: Diagnosis not present

## 2015-02-15 DIAGNOSIS — I2510 Atherosclerotic heart disease of native coronary artery without angina pectoris: Secondary | ICD-10-CM

## 2015-02-15 DIAGNOSIS — R011 Cardiac murmur, unspecified: Secondary | ICD-10-CM | POA: Diagnosis not present

## 2015-02-15 DIAGNOSIS — H9190 Unspecified hearing loss, unspecified ear: Secondary | ICD-10-CM | POA: Diagnosis not present

## 2015-02-15 DIAGNOSIS — Z7901 Long term (current) use of anticoagulants: Secondary | ICD-10-CM | POA: Diagnosis not present

## 2015-02-15 DIAGNOSIS — I10 Essential (primary) hypertension: Secondary | ICD-10-CM | POA: Diagnosis present

## 2015-02-15 DIAGNOSIS — K219 Gastro-esophageal reflux disease without esophagitis: Secondary | ICD-10-CM | POA: Diagnosis not present

## 2015-02-15 DIAGNOSIS — I509 Heart failure, unspecified: Secondary | ICD-10-CM | POA: Insufficient documentation

## 2015-02-15 DIAGNOSIS — I739 Peripheral vascular disease, unspecified: Secondary | ICD-10-CM | POA: Diagnosis not present

## 2015-02-15 DIAGNOSIS — R55 Syncope and collapse: Principal | ICD-10-CM | POA: Insufficient documentation

## 2015-02-15 DIAGNOSIS — M40209 Unspecified kyphosis, site unspecified: Secondary | ICD-10-CM | POA: Diagnosis not present

## 2015-02-15 DIAGNOSIS — Z973 Presence of spectacles and contact lenses: Secondary | ICD-10-CM | POA: Diagnosis not present

## 2015-02-15 DIAGNOSIS — Z87891 Personal history of nicotine dependence: Secondary | ICD-10-CM | POA: Diagnosis not present

## 2015-02-15 DIAGNOSIS — R4182 Altered mental status, unspecified: Secondary | ICD-10-CM | POA: Diagnosis present

## 2015-02-15 DIAGNOSIS — R4020 Unspecified coma: Secondary | ICD-10-CM | POA: Diagnosis present

## 2015-02-15 DIAGNOSIS — R40241 Glasgow coma scale score 13-15: Secondary | ICD-10-CM | POA: Diagnosis not present

## 2015-02-15 DIAGNOSIS — I517 Cardiomegaly: Secondary | ICD-10-CM | POA: Diagnosis not present

## 2015-02-15 DIAGNOSIS — I481 Persistent atrial fibrillation: Secondary | ICD-10-CM | POA: Diagnosis not present

## 2015-02-15 DIAGNOSIS — F0390 Unspecified dementia without behavioral disturbance: Secondary | ICD-10-CM | POA: Diagnosis present

## 2015-02-15 HISTORY — DX: Chronic obstructive pulmonary disease, unspecified: J44.9

## 2015-02-15 LAB — CBC WITH DIFFERENTIAL/PLATELET
BASOS ABS: 0.1 10*3/uL (ref 0.0–0.1)
Basophils Relative: 1 % (ref 0–1)
Eosinophils Absolute: 0.2 10*3/uL (ref 0.0–0.7)
Eosinophils Relative: 2 % (ref 0–5)
HEMATOCRIT: 43.7 % (ref 36.0–46.0)
Hemoglobin: 13.6 g/dL (ref 12.0–15.0)
LYMPHS PCT: 23 % (ref 12–46)
Lymphs Abs: 1.9 10*3/uL (ref 0.7–4.0)
MCH: 29.4 pg (ref 26.0–34.0)
MCHC: 31.1 g/dL (ref 30.0–36.0)
MCV: 94.6 fL (ref 78.0–100.0)
MONO ABS: 0.7 10*3/uL (ref 0.1–1.0)
Monocytes Relative: 9 % (ref 3–12)
Neutro Abs: 5.3 10*3/uL (ref 1.7–7.7)
Neutrophils Relative %: 65 % (ref 43–77)
Platelets: 229 10*3/uL (ref 150–400)
RBC: 4.62 MIL/uL (ref 3.87–5.11)
RDW: 14.7 % (ref 11.5–15.5)
WBC: 8.1 10*3/uL (ref 4.0–10.5)

## 2015-02-15 LAB — BASIC METABOLIC PANEL
ANION GAP: 4 — AB (ref 5–15)
BUN: 18 mg/dL (ref 6–20)
CO2: 28 mmol/L (ref 22–32)
Calcium: 8.7 mg/dL — ABNORMAL LOW (ref 8.9–10.3)
Chloride: 106 mmol/L (ref 101–111)
Creatinine, Ser: 0.84 mg/dL (ref 0.44–1.00)
GFR, EST NON AFRICAN AMERICAN: 59 mL/min — AB (ref >60)
Glucose, Bld: 90 mg/dL (ref 65–99)
POTASSIUM: 4.3 mmol/L (ref 3.5–5.1)
Sodium: 138 mmol/L (ref 135–145)

## 2015-02-15 LAB — PROTIME-INR
INR: 1.84 — ABNORMAL HIGH (ref 0.00–1.49)
PROTHROMBIN TIME: 21.4 s — AB (ref 11.6–15.2)

## 2015-02-15 LAB — TROPONIN I
Troponin I: <0.03 ng/mL (ref <0.031)
Troponin I: <0.03 ng/mL (ref <0.031)

## 2015-02-15 LAB — LIPASE, BLOOD: Lipase: 32 U/L (ref 22–51)

## 2015-02-15 MED ORDER — WARFARIN - PHARMACIST DOSING INPATIENT: Freq: Every day | Status: DC

## 2015-02-15 MED ORDER — LORAZEPAM 0.5 MG PO TABS
0.5000 mg | ORAL_TABLET | Freq: Two times a day (BID) | ORAL | Status: DC | PRN
  Administered 2015-02-15 – 2015-02-16 (×3): 0.5 mg via ORAL
  Filled 2015-02-15 (×3): qty 1

## 2015-02-15 MED ORDER — MEMANTINE HCL 10 MG PO TABS
10.0000 mg | ORAL_TABLET | Freq: Two times a day (BID) | ORAL | Status: DC
  Administered 2015-02-15 – 2015-02-18 (×5): 10 mg via ORAL
  Filled 2015-02-15 (×5): qty 1

## 2015-02-15 MED ORDER — PANTOPRAZOLE SODIUM 40 MG PO TBEC
80.0000 mg | DELAYED_RELEASE_TABLET | Freq: Every day | ORAL | Status: DC
Start: 2015-02-15 — End: 2015-02-18
  Administered 2015-02-15 – 2015-02-18 (×3): 80 mg via ORAL
  Filled 2015-02-15 (×3): qty 2

## 2015-02-15 MED ORDER — ENSURE ENLIVE PO LIQD
237.0000 mL | Freq: Two times a day (BID) | ORAL | Status: DC
  Administered 2015-02-16 – 2015-02-18 (×3): 237 mL via ORAL

## 2015-02-15 MED ORDER — SODIUM CHLORIDE 0.9 % IJ SOLN
3.0000 mL | Freq: Two times a day (BID) | INTRAMUSCULAR | Status: DC
  Administered 2015-02-15 – 2015-02-17 (×3): 3 mL via INTRAVENOUS

## 2015-02-15 MED ORDER — ADULT MULTIVITAMIN W/MINERALS CH
1.0000 | ORAL_TABLET | Freq: Every morning | ORAL | Status: DC
  Administered 2015-02-16 – 2015-02-18 (×2): 1 via ORAL
  Filled 2015-02-15 (×2): qty 1

## 2015-02-15 MED ORDER — RISPERIDONE 0.5 MG PO TABS
0.5000 mg | ORAL_TABLET | Freq: Every day | ORAL | Status: DC
  Administered 2015-02-15 – 2015-02-16 (×2): 0.5 mg via ORAL
  Filled 2015-02-15 (×2): qty 1

## 2015-02-15 MED ORDER — DONEPEZIL HCL 5 MG PO TABS
5.0000 mg | ORAL_TABLET | Freq: Every day | ORAL | Status: DC
  Administered 2015-02-15 – 2015-02-17 (×3): 5 mg via ORAL
  Filled 2015-02-15 (×3): qty 1

## 2015-02-15 MED ORDER — WARFARIN SODIUM 2.5 MG PO TABS
1.2500 mg | ORAL_TABLET | Freq: Once | ORAL | Status: AC
  Administered 2015-02-15: 1.25 mg via ORAL
  Filled 2015-02-15: qty 1

## 2015-02-15 MED ORDER — ALBUTEROL SULFATE (2.5 MG/3ML) 0.083% IN NEBU: 2.5000 mg | INHALATION_SOLUTION | RESPIRATORY_TRACT | Status: DC | PRN

## 2015-02-15 MED ORDER — LORATADINE 10 MG PO TABS
10.0000 mg | ORAL_TABLET | Freq: Every day | ORAL | Status: DC
  Administered 2015-02-15 – 2015-02-18 (×3): 10 mg via ORAL
  Filled 2015-02-15 (×3): qty 1

## 2015-02-15 NOTE — H&P (Addendum)
History and Physical    [name]:[id number] DOB: 1920-12-11 DOA: 02/15/2015  Referring physician: Dr. Manus Gunning PCP: Cassell Smiles., MD  Specialists: none  Chief Complaint: Decreased responsiveness  HPI: Angela [name] is a 81 y.o. female has a past medical history significant for HTN, HLD, CAD, Aortic stenosis, diastolic CHF, is being brought to the hospital by her family due to an episode this morning when they could not wake her up. Per  Patient's daughter, they have in-house help , and this morning she was unable to wake the patient up. Per the aide's report, patient's had a hip difficulty breathing, and at one point appeared to have stopped breathing for a few seconds. There are no reported loss of pulse. Patient has underlying severe dementia, is noncommunicative , and doesn't usually complain of anything. There are no reported fever or chills, and she was at her baseline otherwise up until the night before. Per reports, EMS did not notice pulse loss or breathing difficulties, reported a heart rate of ~30 however no strips are available. In the ED patient's vital signs are normal, she is not bradycardic, she has normal BMP and CBC. Her INR is 1.84. [facility] asked for admission for syncopal episode.  Review of Systems: unable to obtain ROS due to underlying dementia  Past Medical History  Diagnosis Date  . ASCVD (arteriosclerotic cardiovascular disease)     acute MI in 1993;40%left main,80%LAD, 100%RCA-PTCA; EF of 40%;stress nuclear in2007; normal EF;  mixed inferolateral ischemia and infarction; CHF with normal EF in 2011  . CHF (congestive heart failure)     normal EF;mixed inferolateral ischemic and infarction;chf with normal EF in 2011  . Hypertension   . Hyperlipidemia   . Cerebrovascular disease     CVA by CT in 2007,but not noted in 2009;duplex in 12/2006 plaque without stenosis; Emergency Room evaluation in 12/2010 for transient aphasia and paresis-clopidogrel initially added  to ASA Rx, but subsequently warfarin was substituted for these 2 agents  . Tobacco abuse, in remission     quit in 1995  . Claudication   . Dementia     mild  . Kyphosis     severe  . Decreased hearing   . Atrial fibrillation   . Chronic anticoagulation   . TIA (transient ischemic attack)   . Renal disorder   . Glass eye     right-1990  . HOH (hard of hearing)   . Wears glasses   . GERD (gastroesophageal reflux disease)   . Alzheimer's dementia   . COPD (chronic obstructive pulmonary disease)    Past Surgical History  Procedure Laterality Date  . Abdominal hysterectomy    . Appendectomy    . Tonsillectomy    . Enucleation      Right-resulted from infection  . Hardware removal Left 08/16/2013    Procedure: REMOVAL 2 K WIRES FROM LEFT ANKLE. ;  Surgeon: Harvie Junior, MD;  Location: [facility];  Service: Orthopedics;  Laterality: Left;  REMOVAL 2 K WIRES FROM LEFT ANKLE.     Social History:  reports that she quit smoking about 21 years ago. She started smoking about 60 years ago. She has never used smokeless tobacco. She reports that she does not drink alcohol or use illicit drugs.  Allergies  Allergen Reactions  . Haldol [Haloperidol Decanoate]     Over sedation  . Sulfa Antibiotics Other (See Comments)    uknown    Family History  Problem Relation Age  of Onset  . Dementia Mother   . Atrial fibrillation Son   . Hypertension Son     Prior to Admission medications   Medication Sig Start Date End Date Taking? Authorizing Provider  albuterol (PROVENTIL) (2.5 MG/3ML) 0.083% nebulizer solution Take 2.5 mg by nebulization every 4 (four) hours as needed. For shortness of breath/cough 08/09/12  Yes Historical Provider, MD  alendronate (FOSAMAX) 70 MG tablet Take 70 mg by mouth every 7 (seven) days. Take with a full glass of water on an empty stomach.    Yes Historical Provider, MD  cetirizine (ZYRTEC) 10 MG tablet Take 10 mg by mouth at bedtime.    Yes  Historical Provider, MD  Cyanocobalamin (VITAMIN B-12) 5000 MCG SUBL Place 5,000 mcg under the tongue daily.   Yes Historical Provider, MD  donepezil (ARICEPT) 5 MG tablet Take 5 mg by mouth at bedtime.   Yes Historical Provider, MD  furosemide (LASIX) 40 MG tablet Take 40 mg by mouth as needed for fluid.    Yes Historical Provider, MD  lisinopril (PRINIVIL,ZESTRIL) 2.5 MG tablet Take 2.5 mg by mouth every morning.    Yes Historical Provider, MD  LORazepam (ATIVAN) 0.5 MG tablet Take 0.5 mg by mouth 3 (three) times daily. Anxiety and or agitation. *Last dose is not to be given, if patient receives sleeping medication HALCION (TRIAZOLAM)*   Yes Historical Provider, MD  memantine (NAMENDA) 10 MG tablet Take 10 mg by mouth 2 (two) times daily.    Yes Historical Provider, MD  Multiple Vitamins-Minerals (MULTIVITAMIN WITH MINERALS) tablet Take 1 tablet by mouth every morning.    Yes Historical Provider, MD  nitroGLYCERIN (NITROSTAT) 0.4 MG SL tablet Place 0.4 mg under the tongue every 5 (five) minutes as needed. For chest pain  05/25/11  Yes Jodelle Gross, NP  omeprazole (PRILOSEC) 40 MG capsule Take 40 mg by mouth daily.   Yes Historical Provider, MD  potassium chloride SA (K-DUR,KLOR-CON) 20 MEQ tablet Take 20 mEq by mouth daily.   Yes Historical Provider, MD  risperiDONE (RISPERDAL) 0.5 MG tablet Take 0.5 mg by mouth at bedtime.   Yes Historical Provider, MD  Salicylic Acid (SALEX) 6 % SHAM Apply 1 application topically once a week. For psoriasis   Yes Historical Provider, MD  warfarin (COUMADIN) 2.5 MG tablet Take 1.25-2.5 mg by mouth See admin instructions. Take 1.25 mg on Sunday and Wednesday.  Take 2.5 mg all other days. 04/29/13  Yes Kathlen Brunswick, MD  traMADol (ULTRAM) 50 MG tablet Take 1 tablet (50 mg total) by mouth every 6 (six) hours as needed. Patient not taking: Reported on 02/15/2015 07/04/14   Linwood Dibbles, MD   Physical Exam: Filed Vitals:   02/15/15 1330 02/15/15 1400 02/15/15 1430  02/15/15 1432  BP: 123/72 137/81 118/65 137/81  Pulse:    69  Temp:    98.4 F (36.9 C)  TempSrc:    Oral  Resp: SpO2:    97%     General:  No apparent distress, confused, HOH  Eyes: PERRL, EOMI, no scleral icterus  ENT: moist oropharynx  Neck: supple, no lymphadenopathy  Cardiovascular: irrregular rhythm, 3/6 SEM, no JVD, no peripheral edema  Respiratory: CTA biL, good air movement without wheezing, rhonchi or crackled  Abdomen: soft, non tender to palpation, positive bowel sounds  Skin: no rashes  Musculoskeletal: normal bulk and tone, no joint swelling  Psychiatric: normal mood and affect  Neurologic: non focal, does not follow  commands consistently  Labs on Admission:  Basic Metabolic Panel:  Recent Labs Lab 02/15/15 1505  NA 138  K 4.3  CL 106  CO2 28  GLUCOSE 90  BUN 18  CREATININE 0.84  CALCIUM 8.7*   Liver Function Tests: No results for input(s): AST, ALT, ALKPHOS, BILITOT, PROT, ALBUMIN in the last 168 hours.  Recent Labs Lab 02/15/15 1241  LIPASE 32   No results for input(s): AMMONIA in the last 168 hours. CBC:  Recent Labs Lab 02/15/15 1241  WBC 8.1  NEUTROABS 5.3  HGB 13.6  HCT 43.7  MCV 94.6  PLT 229   Cardiac Enzymes:  Recent Labs Lab 02/15/15 1505  TROPONINI <0.03   Radiological Exams on Admission: Dg Chest 1 View  02/15/2015   CLINICAL DATA:  Loss of consciousness.  EXAM: CHEST  1 VIEW  COMPARISON:  06/21/2014 and 01/29/2012  FINDINGS: Lungs are adequately inflated with a stable small nodular opacity over the right perihilar region. No focal consolidation or effusion. Mild stable cardiomegaly. There is calcified plaque over the aortic arch. Degenerative change of the spine.  IMPRESSION: No acute cardiopulmonary disease.  Mild cardiomegaly.   Electronically Signed   By: Elberta Fortisaniel  Boyle M.D.   On: 02/15/2015 14:07   Ct Head Wo Contrast  02/15/2015   CLINICAL DATA:  Loss of consciousness, difficulty walking this  morning.  EXAM: CT HEAD WITHOUT CONTRAST  TECHNIQUE: Contiguous axial images were obtained from the base of the skull through the vertex without intravenous contrast.  COMPARISON:  July 03, 2014  FINDINGS: There is chronic diffuse atrophy. Chronic bilateral periventricular white matter small vessel ischemic changes identified there is an old infarct in the right parietal frontal white matter unchanged. There is no midline shift hydrocephalus or mass. No acute hemorrhage or acute transcortical infarct is identified. The bony calvarium is intact. The visualized sinuses are clear.  IMPRESSION: No focal acute intracranial abnormality identified.  Chronic diffuse atrophy. Chronic bilateral periventricular white matter small vessel ischemic change.   Electronically Signed   By: Sherian ReinWei-Chen  Lin M.D.   On: 02/15/2015 14:19   EKG: Independently reviewed. A fib, RBBB  Assessment/Plan Active Problems:   Dementia   Essential hypertension   Chronic anticoagulation   ASCVD (arteriosclerotic cardiovascular disease)   Altered mental status   Loss of consciousness   Syncope   Atrial fibrillation   ?Syncopal episode / decreased responsiveness -  Patient with reported probable breathing difficulties as well as probable bradycardia based on the reports, however I'm unable to review any strips. She hasn't had any medication changes, of note she is not on any nodal blocking agents. - She has a history of aortic stenosis, most recent 2-D echo was done in  2013 which showed moderate disease. Repeat 2-D echo. - cycle cardiac markers overnight - UA pending to r/o UTI, she is incontinent  CAD - no reported chest pain, troponin negative  Chronic diastolic heart failure - stable, no evidence of fluid overload  Dementia - currently at baseline per family   HTN - clinically appears on the dry side, hold Lasix for now - allow diet  A fib - persistent, rate 60-80s currently - On coumadin, daughter reports  multiple falls recently. - may need to discuss with cardiology about anticoagulation risks/benefits  Aortic stenosis - repeat Echo   Diet: heart healthy Fluids: none DVT Prophylaxis: Coumadin  Code Status: DNR  Family Communication: d/w daughter bedside  Disposition Plan: admit to telemetry   Time spent:  50  Costin M. Elvera LennoxGherghe, MD [facility] Pager [phone]  If 7PM-7AM, please contact night-coverage [email or web address] Password [email or web address] 02/15/2015, 4:22 PM

## 2015-02-15 NOTE — ED Notes (Signed)
Phlebotomy attempted three sticks to draw blood labs, but was unable to draw back successfully.

## 2015-02-15 NOTE — ED Notes (Signed)
[facility] [phone],

## 2015-02-15 NOTE — Progress Notes (Signed)
ANTICOAGULATION CONSULT NOTE - Initial Consult  Pharmacy Consult for Coumadin Indication: atrial fibrillation  Allergies  Allergen Reactions  . Haldol [Haloperidol Decanoate]     Over sedation  . Sulfa Antibiotics Other (See Comments)    uknown    Patient Measurements:   Heparin Dosing Weight:   Vital Signs: Temp: 97.7 F (36.5 C) (05/15 1701) Temp Source: Oral (05/15 1701) BP: 143/64 mmHg (05/15 1701) Pulse Rate: 129 (05/15 1701)  Labs:  Recent Labs  02/15/15 1241 02/15/15 1505  HGB 13.6  --   HCT 43.7  --   PLT 229  --   LABPROT 21.4*  --   INR 1.84*  --   CREATININE  --  0.84  TROPONINI  --  <0.03    CrCl cannot be calculated (Unknown ideal weight.).   Medical History: Past Medical History  Diagnosis Date  . ASCVD (arteriosclerotic cardiovascular disease)     acute MI in 1993;40%left main,80%LAD, 100%RCA-PTCA; EF of 40%;stress nuclear in2007; normal EF;  mixed inferolateral ischemia and infarction; CHF with normal EF in 2011  . CHF (congestive heart failure)     normal EF;mixed inferolateral ischemic and infarction;chf with normal EF in 2011  . Hypertension   . Hyperlipidemia   . Cerebrovascular disease     CVA by CT in 2007,but not noted in 2009;duplex in 12/2006 plaque without stenosis; Emergency Room evaluation in 12/2010 for transient aphasia and paresis-clopidogrel initially added to ASA Rx, but subsequently warfarin was substituted for these 2 agents  . Tobacco abuse, in remission     quit in 1995  . Claudication   . Dementia     mild  . Kyphosis     severe  . Decreased hearing   . Atrial fibrillation   . Chronic anticoagulation   . TIA (transient ischemic attack)   . Renal disorder   . Glass eye     right-1990  . HOH (hard of hearing)   . Wears glasses   . GERD (gastroesophageal reflux disease)   . Alzheimer's dementia   . COPD (chronic obstructive pulmonary disease)     Medications:  Prescriptions prior to admission  Medication Sig  Dispense Refill Last Dose  . albuterol (PROVENTIL) (2.5 MG/3ML) 0.083% nebulizer solution Take 2.5 mg by nebulization every 4 (four) hours as needed. For shortness of breath/cough   02/14/2015 at Unknown time  . alendronate (FOSAMAX) 70 MG tablet Take 70 mg by mouth every 7 (seven) days. Take with a full glass of water on an empty stomach.    Past Week at Unknown time  . cetirizine (ZYRTEC) 10 MG tablet Take 10 mg by mouth at bedtime.    02/14/2015 at Unknown time  . Cyanocobalamin (VITAMIN B-12) 5000 MCG SUBL Place 5,000 mcg under the tongue daily.   02/14/2015 at Unknown time  . donepezil (ARICEPT) 5 MG tablet Take 5 mg by mouth at bedtime.   02/14/2015 at Unknown time  . furosemide (LASIX) 40 MG tablet Take 40 mg by mouth as needed for fluid.    02/14/2015 at Unknown time  . lisinopril (PRINIVIL,ZESTRIL) 2.5 MG tablet Take 2.5 mg by mouth every morning.    02/14/2015 at Unknown time  . LORazepam (ATIVAN) 0.5 MG tablet Take 0.5 mg by mouth 3 (three) times daily. Anxiety and or agitation. *Last dose is not to be given, if patient receives sleeping medication HALCION (TRIAZOLAM)*   02/14/2015 at Unknown time  . memantine (NAMENDA) 10 MG tablet Take 10 mg by mouth  2 (two) times daily.    02/14/2015 at Unknown time  . Multiple Vitamins-Minerals (MULTIVITAMIN WITH MINERALS) tablet Take 1 tablet by mouth every morning.    02/14/2015 at Unknown time  . nitroGLYCERIN (NITROSTAT) 0.4 MG SL tablet Place 0.4 mg under the tongue every 5 (five) minutes as needed. For chest pain    unknown  . omeprazole (PRILOSEC) 40 MG capsule Take 40 mg by mouth daily.   02/14/2015 at Unknown time  . potassium chloride SA (K-DUR,KLOR-CON) 20 MEQ tablet Take 20 mEq by mouth daily.   02/14/2015 at Unknown time  . risperiDONE (RISPERDAL) 0.5 MG tablet Take 0.5 mg by mouth at bedtime.   02/14/2015 at Unknown time  . Salicylic Acid (SALEX) 6 % SHAM Apply 1 application topically once a week. For psoriasis   Past Week at Unknown time  . warfarin  (COUMADIN) 2.5 MG tablet Take 1.25-2.5 mg by mouth See admin instructions. Take 1.25 mg on Sunday and Wednesday.  Take 2.5 mg all other days.   02/14/2015 at 1900  . traMADol (ULTRAM) 50 MG tablet Take 1 tablet (50 mg total) by mouth every 6 (six) hours as needed. (Patient not taking: Reported on 02/15/2015) 21 tablet 0     Assessment: Continuation of PTA  Coumadin for AFIB INR sub therapeutic on admission Labs reviewed PTA medications reviewed  Goal of Therapy:  INR 2-3 Monitor platelets by anticoagulation protocol: Yes   Plan:  Coumadin 1.25 mg po x 1 dose today (home regiment) INR/PT daily Labs per protocol  Raquel JamesPittman, Dejanay Wamboldt Bennett 02/15/2015,5:31 PM

## 2015-02-15 NOTE — ED Notes (Signed)
resp therapy at bedside with lab for additional blood work,

## 2015-02-15 NOTE — ED Notes (Signed)
Dr Rancour at bedside,  

## 2015-02-15 NOTE — ED Notes (Signed)
cbg 111 per ems.

## 2015-02-15 NOTE — ED Provider Notes (Signed)
CSN: [id number]     Arrival date & time 02/15/15  1158 History  This chart was scribed for [name], MD by Andrew Au, ED Scribe. This patient was seen in room APA12/APA12 and the patient's care was started at 12:58 PM.   Chief Complaint  Patient presents with  . Loss of Consciousness   The history is provided by the patient and a relative. The history is limited by the condition of the patient (hx limited due to dementia). No language interpreter was used.   LEVEL 5 CAVEAT due to dementia.   HPI Comments:  Angela [name] is a 81 y.o. female brought in by EMS who present to the Emergency Department complaining of LOC. Per family member, EMS was called by caregiver due to trouble waking her up this morning. He reports hx irregular heart beat. Pt is able to ambulates with a walker as well as with assistance. Pt takes coumadin.  He denies fever and chills. EMS reports she was bradycardic to the 30s.  Past Medical History  Diagnosis Date  . ASCVD (arteriosclerotic cardiovascular disease)     acute MI in 1993;40%left main,80%LAD, 100%RCA-PTCA; EF of 40%;stress nuclear in2007; normal EF;  mixed inferolateral ischemia and infarction; CHF with normal EF in 2011  . CHF (congestive heart failure)     normal EF;mixed inferolateral ischemic and infarction;chf with normal EF in 2011  . Hypertension   . Hyperlipidemia   . Cerebrovascular disease     CVA by CT in 2007,but not noted in 2009;duplex in 12/2006 plaque without stenosis; Emergency Room evaluation in 12/2010 for transient aphasia and paresis-clopidogrel initially added to ASA Rx, but subsequently warfarin was substituted for these 2 agents  . Tobacco abuse, in remission     quit in 1995  . Claudication   . Dementia     mild  . Kyphosis     severe  . Decreased hearing   . Atrial fibrillation   . Chronic anticoagulation   . TIA (transient ischemic attack)   . Renal disorder   . Glass eye     right-1990  . HOH (hard of hearing)    . Wears glasses   . GERD (gastroesophageal reflux disease)   . Alzheimer's dementia   . COPD (chronic obstructive pulmonary disease)    Past Surgical History  Procedure Laterality Date  . Abdominal hysterectomy    . Appendectomy    . Tonsillectomy    . Enucleation      Right-resulted from infection  . Hardware removal Left 08/16/2013    Procedure: REMOVAL 2 K WIRES FROM LEFT ANKLE. ;  Surgeon: Harvie Junior, MD;  Location: [facility];  Service: Orthopedics;  Laterality: Left;  REMOVAL 2 K WIRES FROM LEFT ANKLE.     Family History  Problem Relation Age of Onset  . Dementia Mother   . Atrial fibrillation Son   . Hypertension Son    History  Substance Use Topics  . Smoking status: Former Smoker    Start date: 06/06/1954    Quit date: 01/05/1994  . Smokeless tobacco: Never Used  . Alcohol Use: No   OB History    No data available     Review of Systems  Unable to perform ROS: Dementia  Cardiovascular: Positive for syncope.   Allergies  Haldol and Sulfa antibiotics  Home Medications   Prior to Admission medications   Medication Sig Start Date End Date Taking? Authorizing Provider  albuterol (PROVENTIL) (2.5 MG/3ML) 0.083%  nebulizer solution Take 2.5 mg by nebulization every 4 (four) hours as needed. For shortness of breath/cough 08/09/12  Yes Historical Provider, MD  alendronate (FOSAMAX) 70 MG tablet Take 70 mg by mouth every 7 (seven) days. Take with a full glass of water on an empty stomach.    Yes Historical Provider, MD  cetirizine (ZYRTEC) 10 MG tablet Take 10 mg by mouth at bedtime.    Yes Historical Provider, MD  Cyanocobalamin (VITAMIN B-12) 5000 MCG SUBL Place 5,000 mcg under the tongue daily.   Yes Historical Provider, MD  donepezil (ARICEPT) 5 MG tablet Take 5 mg by mouth at bedtime.   Yes Historical Provider, MD  furosemide (LASIX) 40 MG tablet Take 40 mg by mouth as needed for fluid.    Yes Historical Provider, MD  lisinopril  (PRINIVIL,ZESTRIL) 2.5 MG tablet Take 2.5 mg by mouth every morning.    Yes Historical Provider, MD  LORazepam (ATIVAN) 0.5 MG tablet Take 0.5 mg by mouth 3 (three) times daily. Anxiety and or agitation. *Last dose is not to be given, if patient receives sleeping medication HALCION (TRIAZOLAM)*   Yes Historical Provider, MD  memantine (NAMENDA) 10 MG tablet Take 10 mg by mouth 2 (two) times daily.    Yes Historical Provider, MD  Multiple Vitamins-Minerals (MULTIVITAMIN WITH MINERALS) tablet Take 1 tablet by mouth every morning.    Yes Historical Provider, MD  nitroGLYCERIN (NITROSTAT) 0.4 MG SL tablet Place 0.4 mg under the tongue every 5 (five) minutes as needed. For chest pain  05/25/11  Yes Jodelle GrossKathryn M Lawrence, NP  omeprazole (PRILOSEC) 40 MG capsule Take 40 mg by mouth daily.   Yes Historical Provider, MD  potassium chloride SA (K-DUR,KLOR-CON) 20 MEQ tablet Take 20 mEq by mouth daily.   Yes Historical Provider, MD  risperiDONE (RISPERDAL) 0.5 MG tablet Take 0.5 mg by mouth at bedtime.   Yes Historical Provider, MD  Salicylic Acid (SALEX) 6 % SHAM Apply 1 application topically once a week. For psoriasis   Yes Historical Provider, MD  warfarin (COUMADIN) 2.5 MG tablet Take 1.25-2.5 mg by mouth See admin instructions. Take 1.25 mg on Sunday and Wednesday.  Take 2.5 mg all other days. 04/29/13  Yes Kathlen Brunswickobert M Rothbart, MD  traMADol (ULTRAM) 50 MG tablet Take 1 tablet (50 mg total) by mouth every 6 (six) hours as needed. Patient not taking: Reported on 02/15/2015 07/04/14   Linwood DibblesJon Knapp, MD   BP 143/64 mmHg  Pulse 129  Temp(Src) 97.7 F (36.5 C) (Oral)  Resp 18  SpO2 98% Physical Exam  Constitutional: She is oriented to person, place, and time. She appears well-developed and well-nourished. No distress.  HENT:  Head: Normocephalic and atraumatic.  Mouth/Throat: Oropharynx is clear and moist. No oropharyngeal exudate.  Eyes: Conjunctivae and EOM are normal. Pupils are equal, round, and reactive to  light.  Neck: Normal range of motion. Neck supple.  No meningismus.  Cardiovascular: Normal rate and intact distal pulses.  An irregular rhythm present.  Murmur heard.  Systolic murmur is present  Pulmonary/Chest: Effort normal and breath sounds normal. No respiratory distress.  Abdominal: Soft. There is no tenderness. There is no rebound and no guarding.  Genitourinary:  Incontinent of urine  Musculoskeletal: Normal range of motion. She exhibits no edema or tenderness.  Neurological: She is alert and oriented to person, place, and time. No cranial nerve deficit. She exhibits normal muscle tone. Coordination normal.  Does not follow commands but moves all extremities.   Skin: Skin  is warm.  Psychiatric: She has a normal mood and affect. Her behavior is normal.  Nursing note and vitals reviewed.   ED Course  Procedures (including critical care time) DIAGNOSTIC STUDIES: Oxygen Saturation is 98% on RA, normal by my interpretation.    COORDINATION OF CARE: 1:09 PM- Pt advised of plan for treatment and pt agrees.'  Labs Review Labs Reviewed  BASIC METABOLIC PANEL - Abnormal; Notable for the following:    Calcium 8.7 (*)    GFR calc non Af Amer 59 (*)    Anion gap 4 (*)    All other components within normal limits  PROTIME-INR - Abnormal; Notable for the following:    Prothrombin Time 21.4 (*)    INR 1.84 (*)    All other components within normal limits  CBC WITH DIFFERENTIAL/PLATELET  TROPONIN I  LIPASE, BLOOD  URINALYSIS, ROUTINE W REFLEX MICROSCOPIC  TROPONIN I  TROPONIN I  BASIC METABOLIC PANEL  CBC  PROTIME-INR    Imaging Review Dg Chest 1 View  02/15/2015   CLINICAL DATA:  Loss of consciousness.  EXAM: CHEST  1 VIEW  COMPARISON:  06/21/2014 and 01/29/2012  FINDINGS: Lungs are adequately inflated with a stable small nodular opacity over the right perihilar region. No focal consolidation or effusion. Mild stable cardiomegaly. There is calcified plaque over the aortic  arch. Degenerative change of the spine.  IMPRESSION: No acute cardiopulmonary disease.  Mild cardiomegaly.   Electronically Signed   By: Elberta Fortisaniel  Boyle M.D.   On: 02/15/2015 14:07   Ct Head Wo Contrast  02/15/2015   CLINICAL DATA:  Loss of consciousness, difficulty walking this morning.  EXAM: CT HEAD WITHOUT CONTRAST  TECHNIQUE: Contiguous axial images were obtained from the base of the skull through the vertex without intravenous contrast.  COMPARISON:  July 03, 2014  FINDINGS: There is chronic diffuse atrophy. Chronic bilateral periventricular white matter small vessel ischemic changes identified there is an old infarct in the right parietal frontal white matter unchanged. There is no midline shift hydrocephalus or mass. No acute hemorrhage or acute transcortical infarct is identified. The bony calvarium is intact. The visualized sinuses are clear.  IMPRESSION: No focal acute intracranial abnormality identified.  Chronic diffuse atrophy. Chronic bilateral periventricular white matter small vessel ischemic change.   Electronically Signed   By: Sherian ReinWei-Chen  Lin M.D.   On: 02/15/2015 14:19     EKG Interpretation   Date/Time:  Sunday Feb 15 2015 14:04:32 EDT Ventricular Rate:  61 PR Interval:    QRS Duration: 142 QT Interval:  434 QTC Calculation: 437 R Axis:   94 Text Interpretation:  Atrial fibrillation RBBB and LPFB No significant  change was found Confirmed by Manus GunningANCOUR  MD, Lateria Alderman [phone]([id number]) on 02/15/2015  2:11:46 PM      MDM   Final diagnoses:  Loss of consciousness  per EMS, patient had difficulty responding for her caregiver and was bradycardic in the 30s. Patient confused at her baseline per son. Patient incontinent of urine. Patient denies any pain.  Syncope versus seizure.  Afib rate controlled on arrival.  No CP Or SOB. nonfocal neuro exam. CT head negative. INR 1.8.  Patient on no AV nodal blocking drugs.  HR and BP stable in the ED.  More confused than baseline per  family. Admit for further evaluation.  I personally performed the services described in this documentation, which was scribed in my presence. The recorded information has been reviewed and is accurate.   Glynn OctaveStephen Tagen Brethauer, MD 02/15/15  1741 

## 2015-02-15 NOTE — ED Notes (Signed)
Family at bedside updated on plan of care,

## 2015-02-15 NOTE — ED Notes (Signed)
EMS reports pt bedridden and had awake and talking earlier this morning.  Reports when care giver went in to check on her later this morning, pt was hard to arouse.  When EMS placed pt on monitor, pt was afib and hr dipped in the 30's.  Pt alert, confused but has alzheimer's.  EMS reports within 5 min of their arrival, pt has been alert.  Pt on home 02 at 2liters per ems.

## 2015-02-16 ENCOUNTER — Inpatient Hospital Stay (HOSPITAL_BASED_OUTPATIENT_CLINIC_OR_DEPARTMENT_OTHER): Payer: Medicare Other

## 2015-02-16 DIAGNOSIS — I10 Essential (primary) hypertension: Secondary | ICD-10-CM

## 2015-02-16 DIAGNOSIS — R55 Syncope and collapse: Secondary | ICD-10-CM

## 2015-02-16 DIAGNOSIS — I480 Paroxysmal atrial fibrillation: Secondary | ICD-10-CM

## 2015-02-16 DIAGNOSIS — I2510 Atherosclerotic heart disease of native coronary artery without angina pectoris: Secondary | ICD-10-CM | POA: Diagnosis not present

## 2015-02-16 DIAGNOSIS — F0390 Unspecified dementia without behavioral disturbance: Secondary | ICD-10-CM | POA: Diagnosis not present

## 2015-02-16 DIAGNOSIS — Z7901 Long term (current) use of anticoagulants: Secondary | ICD-10-CM | POA: Diagnosis not present

## 2015-02-16 DIAGNOSIS — R410 Disorientation, unspecified: Secondary | ICD-10-CM

## 2015-02-16 LAB — CBC
HEMATOCRIT: 45.5 % (ref 36.0–46.0)
HEMOGLOBIN: 14.3 g/dL (ref 12.0–15.0)
MCH: 29.5 pg (ref 26.0–34.0)
MCHC: 31.4 g/dL (ref 30.0–36.0)
MCV: 93.8 fL (ref 78.0–100.0)
Platelets: 217 10*3/uL (ref 150–400)
RBC: 4.85 MIL/uL (ref 3.87–5.11)
RDW: 14.4 % (ref 11.5–15.5)
WBC: 9.6 10*3/uL (ref 4.0–10.5)

## 2015-02-16 LAB — URINALYSIS, ROUTINE W REFLEX MICROSCOPIC
Bilirubin Urine: NEGATIVE
GLUCOSE, UA: NEGATIVE mg/dL
Ketones, ur: NEGATIVE mg/dL
LEUKOCYTES UA: NEGATIVE
Nitrite: NEGATIVE
PH: 6.5 (ref 5.0–8.0)
Protein, ur: NEGATIVE mg/dL
Specific Gravity, Urine: 1.015 (ref 1.005–1.030)
Urobilinogen, UA: 0.2 mg/dL (ref 0.0–1.0)

## 2015-02-16 LAB — BASIC METABOLIC PANEL
Anion gap: 7 (ref 5–15)
BUN: 15 mg/dL (ref 6–20)
CHLORIDE: 103 mmol/L (ref 101–111)
CO2: 31 mmol/L (ref 22–32)
CREATININE: 0.87 mg/dL (ref 0.44–1.00)
Calcium: 9.1 mg/dL (ref 8.9–10.3)
GFR calc Af Amer: >60 mL/min (ref >60)
GFR calc non Af Amer: 57 mL/min — ABNORMAL LOW (ref >60)
GLUCOSE: 101 mg/dL — AB (ref 65–99)
Potassium: 4 mmol/L (ref 3.5–5.1)
Sodium: 141 mmol/L (ref 135–145)

## 2015-02-16 LAB — PROTIME-INR
INR: 1.98 — ABNORMAL HIGH (ref 0.00–1.49)
PROTHROMBIN TIME: 22.7 s — AB (ref 11.6–15.2)

## 2015-02-16 LAB — URINE MICROSCOPIC-ADD ON

## 2015-02-16 LAB — TROPONIN I
TROPONIN I: 0.03 ng/mL (ref <0.031)
Troponin I: <0.03 ng/mL (ref <0.031)

## 2015-02-16 MED ORDER — ACETAMINOPHEN 325 MG PO TABS
650.0000 mg | ORAL_TABLET | Freq: Four times a day (QID) | ORAL | Status: DC | PRN
  Administered 2015-02-16: 650 mg via ORAL
  Filled 2015-02-16: qty 2

## 2015-02-16 MED ORDER — POLYVINYL ALCOHOL 1.4 % OP SOLN
1.0000 [drp] | OPHTHALMIC | Status: DC | PRN
  Filled 2015-02-16: qty 15

## 2015-02-16 MED ORDER — SODIUM CHLORIDE 0.9 % IV SOLN
INTRAVENOUS | Status: AC
  Administered 2015-02-16: 17:00:00 via INTRAVENOUS

## 2015-02-16 MED ORDER — WARFARIN SODIUM 5 MG PO TABS
2.5000 mg | ORAL_TABLET | Freq: Once | ORAL | Status: AC
  Administered 2015-02-16: 2.5 mg via ORAL
  Filled 2015-02-16: qty 1

## 2015-02-16 MED ORDER — WARFARIN - PHARMACIST DOSING INPATIENT: Status: DC

## 2015-02-16 NOTE — Progress Notes (Signed)
UR chart review completed.  

## 2015-02-16 NOTE — Progress Notes (Signed)
Initial Nutrition Assessment  INTERVENTION:  Ensure Enlive (each supplement provides 350kcal and 20 grams of protein)  NUTRITION DIAGNOSIS:  Inadequate protein intake related to lethargy / confusion as evidenced by per patient/family report.  GOAL:  Patient will meet greater than or equal to 90% of their needs   MONITOR:  PO intake, Supplement acceptance, Weight trends  REASON FOR ASSESSMENT:  Malnutrition Screening Tool    ASSESSMENT: Pt presents with decreased responsiveness. Hx of severe dementia and unable to provide hx. Son is with her and says she lives with him.  Pt usual diet is soft mostly vegetables and very little protein. She drinks Ensure, yogurt smoothies and tea at home. Fed by family and her appetite has been sufficient to maintain stable weight over the past 2+ years. Physical exam deferred at this time.  Height:  Ht Readings from Last 1 Encounters:  02/16/15 5\' 4"  (1.626 m)    Weight:  Wt Readings from Last 1 Encounters:  02/16/15 119 lb 0.8 oz (54 kg)    Ideal Body Weight:  54.5 kg  Wt Readings from Last 10 Encounters:  02/16/15 119 lb 0.8 oz (54 kg)  06/21/14 120 lb (54.432 kg)  06/06/14 118 lb (53.524 kg)  08/16/13 119 lb (53.978 kg)  07/03/13 121 lb (54.885 kg)  06/12/13 121 lb (54.885 kg)  02/06/13 118 lb 4.8 oz (53.661 kg)  11/12/12 124 lb (56.246 kg)  09/05/12 123 lb (55.792 kg)  08/23/12 123 lb (55.792 kg)    BMI:  Body mass index is 20.42 kg/(m^2).  Estimated Nutritional Needs:  Kcal:  1650  Protein:  66-75 gr  Fluid:  1.4 liters daily (25 ml /kg)  Skin:    dry  Diet Order:  Diet Heart Room service appropriate?: Yes; Fluid consistency:: Thin  EDUCATION NEEDS: Not appropriate given pt dementia      Intake/Output Summary (Last 24 hours) at 02/16/15 1610 Last data filed at 02/16/15 1400  Gross per 24 hour  Intake    240 ml  Output    225 ml  Net     15 ml    Last BM:  unknown  Royann ShiversLynn Javares Kaufhold  MS,RD,CSG,LDN Office: [phone] Pager: [phone]

## 2015-02-16 NOTE — Care Management Note (Signed)
Case Management Note  Patient Details  Name: Angela [name] MRN: [id number] Date of Birth: 06/27/1921  Subjective/Objective:                  Pt admitted from home with syncopal episode. Pt lives with her son and will return home at discharge. Pt has a walker that she uses with assistance. Pt has a CAP aide during the day and on weekends. Pt also has a Charity fundraiserN that comes from [facility] to see pt for continuing CAP services.  Action/Plan: Will continue to follow for discharge planning needs.  Expected Discharge Date:  02/17/15               Expected Discharge Plan:  Home w Home Health Services  In-House Referral:  NA  Discharge planning Services  CM Consult  Post Acute Care Choice:  Home Health Choice offered to:  Adult Children  DME Arranged:    DME Agency:     HH Arranged:    HH Agency:     Status of Service:  In process, will continue to follow  Medicare Important Message Given:    Date Medicare IM Given:    Medicare IM give by:    Date Additional Medicare IM Given:    Additional Medicare Important Message give by:     If discussed at Long Length of Stay Meetings, dates discussed:    Additional Comments:  Cheryl FlashBlackwell, Armonii Sieh Crowder, RN 02/16/2015, 3:02 PM

## 2015-02-16 NOTE — Progress Notes (Signed)
ANTICOAGULATION CONSULT NOTE - Initial Consult  Pharmacy Consult for Coumadin Indication: atrial fibrillation  Allergies  Allergen Reactions  . Haldol [Haloperidol Decanoate]     Over sedation  . Sulfa Antibiotics Other (See Comments)    uknown    Patient Measurements: Height: 5\' 4"  (162.6 cm) Weight: 119 lb 0.8 oz (54 kg) IBW/kg (Calculated) : 54.7   Vital Signs: Temp: 99.7 F (37.6 C) (05/16 0603) Temp Source: Oral (05/16 0603) BP: 125/70 mmHg (05/16 0603) Pulse Rate: 92 (05/16 0603)  Labs:  Recent Labs  02/15/15 1241  02/15/15 1505 02/15/15 2230 02/16/15 0328 02/16/15 0633 02/16/15 0946  HGB 13.6  --   --   --   --  14.3  --   HCT 43.7  --   --   --   --  45.5  --   PLT 229  --   --   --   --  217  --   LABPROT 21.4*  --   --   --   --  22.7*  --   INR 1.84*  --   --   --   --  1.98*  --   CREATININE  --   --  0.84  --   --  0.87  --   TROPONINI  --   < > <0.03 <0.03 <0.03  --  0.03  < > = values in this interval not displayed.  Estimated Creatinine Clearance: 35.9 mL/min (by C-G formula based on Cr of 0.87).  Medical History: Past Medical History  Diagnosis Date  . ASCVD (arteriosclerotic cardiovascular disease)     acute MI in 1993;40%left main,80%LAD, 100%RCA-PTCA; EF of 40%;stress nuclear in2007; normal EF;  mixed inferolateral ischemia and infarction; CHF with normal EF in 2011  . CHF (congestive heart failure)     normal EF;mixed inferolateral ischemic and infarction;chf with normal EF in 2011  . Hypertension   . Hyperlipidemia   . Cerebrovascular disease     CVA by CT in 2007,but not noted in 2009;duplex in 12/2006 plaque without stenosis; Emergency Room evaluation in 12/2010 for transient aphasia and paresis-clopidogrel initially added to ASA Rx, but subsequently warfarin was substituted for these 2 agents  . Tobacco abuse, in remission     quit in 1995  . Claudication   . Dementia     mild  . Kyphosis     severe  . Decreased hearing   .  Atrial fibrillation   . Chronic anticoagulation   . TIA (transient ischemic attack)   . Renal disorder   . Glass eye     right-1990  . HOH (hard of hearing)   . Wears glasses   . GERD (gastroesophageal reflux disease)   . Alzheimer's dementia   . COPD (chronic obstructive pulmonary disease)    Medications:  Prescriptions prior to admission  Medication Sig Dispense Refill Last Dose  . albuterol (PROVENTIL) (2.5 MG/3ML) 0.083% nebulizer solution Take 2.5 mg by nebulization every 4 (four) hours as needed. For shortness of breath/cough   02/14/2015 at Unknown time  . alendronate (FOSAMAX) 70 MG tablet Take 70 mg by mouth every 7 (seven) days. Take with a full glass of water on an empty stomach.    Past Week at Unknown time  . cetirizine (ZYRTEC) 10 MG tablet Take 10 mg by mouth at bedtime.    02/14/2015 at Unknown time  . Cyanocobalamin (VITAMIN B-12) 5000 MCG SUBL Place 5,000 mcg under the tongue daily.   02/14/2015  at Unknown time  . donepezil (ARICEPT) 5 MG tablet Take 5 mg by mouth at bedtime.   02/14/2015 at Unknown time  . furosemide (LASIX) 40 MG tablet Take 40 mg by mouth as needed for fluid.    02/14/2015 at Unknown time  . lisinopril (PRINIVIL,ZESTRIL) 2.5 MG tablet Take 2.5 mg by mouth every morning.    02/14/2015 at Unknown time  . LORazepam (ATIVAN) 0.5 MG tablet Take 0.5 mg by mouth 3 (three) times daily. Anxiety and or agitation. *Last dose is not to be given, if patient receives sleeping medication HALCION (TRIAZOLAM)*   02/14/2015 at Unknown time  . memantine (NAMENDA) 10 MG tablet Take 10 mg by mouth 2 (two) times daily.    02/14/2015 at Unknown time  . Multiple Vitamins-Minerals (MULTIVITAMIN WITH MINERALS) tablet Take 1 tablet by mouth every morning.    02/14/2015 at Unknown time  . nitroGLYCERIN (NITROSTAT) 0.4 MG SL tablet Place 0.4 mg under the tongue every 5 (five) minutes as needed. For chest pain    unknown  . omeprazole (PRILOSEC) 40 MG capsule Take 40 mg by mouth daily.    02/14/2015 at Unknown time  . potassium chloride SA (K-DUR,KLOR-CON) 20 MEQ tablet Take 20 mEq by mouth daily.   02/14/2015 at Unknown time  . risperiDONE (RISPERDAL) 0.5 MG tablet Take 0.5 mg by mouth at bedtime.   02/14/2015 at Unknown time  . Salicylic Acid (SALEX) 6 % SHAM Apply 1 application topically once a week. For psoriasis   Past Week at Unknown time  . warfarin (COUMADIN) 2.5 MG tablet Take 1.25-2.5 mg by mouth See admin instructions. Take 1.25 mg on Sunday and Wednesday.  Take 2.5 mg all other days.   02/14/2015 at 1900  . traMADol (ULTRAM) 50 MG tablet Take 1 tablet (50 mg total) by mouth every 6 (six) hours as needed. (Patient not taking: Reported on 02/15/2015) 21 tablet 0    Assessment: Continuation of PTA  Coumadin for AFIB INR sub therapeutic.  Daughter reports multiple falls recently.  Goal of Therapy:  INR 2-3 Monitor platelets by anticoagulation protocol: Yes   Plan:  Coumadin 2.5 mg po x 1. INR/PT daily Labs per protocol  Mady GemmaHayes, Brogan Martis R 02/16/2015,1:56 PM

## 2015-02-16 NOTE — Progress Notes (Signed)
[facility] PROGRESS NOTE  Angela [name] ONG:[id number] DOB: 10/04/1920 DOA: 02/15/2015 PCP: Cassell SmilesFUSCO,LAWRENCE J., MD  Assessment/Plan: ?Syncopal episode / decreased responsiveness - Patient with reported probable breathing difficulties as well as probable bradycardia based on the reports. No strips available for review. No events on tele. CT head without acute abnormality.  No reported medication changes, of note she is not on any nodal blocking agents. She has a history of aortic stenosis await 2-D echo. Troponin negative x3.  UA pending to r/o UTI will obtain cath sample as she is incontinent. Check TSH, B12. Somewhat orthostatic from lying to sitting.  Remains somewhat lethargic this am.   CAD - Denies chest pain, troponin negative. No events on tele.   Chronic diastolic heart failure - appears stable. Daily weights and intake and output. Home meds include lasix, lisinopril. Both on hold for now.   Dementia - somewhat lethargic but  at baseline per family   HTN - clinically appeared on the dry side so is on hold Lasix for now. Continue to monitor   A fib - rate controlled.  On coumadin INR 1.98 today. Daughter reported multiple falls recently. Consider discussing with cardiology about anticoagulation risks/benefits  Aortic stenosis - await. Echo   Code Status: DNR Family Communication: none present Disposition Plan: back home hopefully tomorrow   Consultants:  none  Procedures:  none  Antibiotics:  none  HPI/Subjective: Awake. Very HOH. Denies pain.   Objective: Filed Vitals:   02/16/15 0603  BP: 125/70  Pulse: 92  Temp: 99.7 F (37.6 C)  Resp: 18    Intake/Output Summary (Last 24 hours) at 02/16/15 1018 Last data filed at 02/15/15 1900  Gross per 24 hour  Intake     60 ml  Output     75 ml  Net    -15 ml   Filed Weights   02/16/15 0100  Weight: 54 kg (119 lb 0.8 oz)    Exam:   General:  Thin frail   Cardiovascular: irregularly  irregular +murmur no LE edema  Respiratory: normal effort slightly shallow BS clear bilaterally no wheeze, crackles  Abdomen: non-distended +BS   Musculoskeletal: no clubbing or cyanosis   Data Reviewed: Basic Metabolic Panel:  Recent Labs Lab 02/15/15 1505 02/16/15 0633  NA 138 141  K 4.3 4.0  CL 106 103  CO2 28 31  GLUCOSE 90 101*  BUN 18 15  CREATININE 0.84 0.87  CALCIUM 8.7* 9.1   Liver Function Tests: No results for input(s): AST, ALT, ALKPHOS, BILITOT, PROT, ALBUMIN in the last 168 hours.  Recent Labs Lab 02/15/15 1241  LIPASE 32   No results for input(s): AMMONIA in the last 168 hours. CBC:  Recent Labs Lab 02/15/15 1241 02/16/15 0633  WBC 8.1 9.6  NEUTROABS 5.3  --   HGB 13.6 14.3  HCT 43.7 45.5  MCV 94.6 93.8  PLT 229 217   Cardiac Enzymes:  Recent Labs Lab 02/15/15 1505 02/15/15 2230 02/16/15 0328  TROPONINI <0.03 <0.03 <0.03   BNP (last 3 results) No results for input(s): BNP in the last 8760 hours.  ProBNP (last 3 results) No results for input(s): PROBNP in the last 8760 hours.  CBG: No results for input(s): GLUCAP in the last 168 hours.  No results found for this or any previous visit (from the past 240 hour(s)).   Studies: Dg Chest 1 View  02/15/2015   CLINICAL DATA:  Loss of consciousness.  EXAM: CHEST  1 VIEW  COMPARISON:  06/21/2014 and 01/29/2012  FINDINGS: Lungs are adequately inflated with a stable small nodular opacity over the right perihilar region. No focal consolidation or effusion. Mild stable cardiomegaly. There is calcified plaque over the aortic arch. Degenerative change of the spine.  IMPRESSION: No acute cardiopulmonary disease.  Mild cardiomegaly.   Electronically Signed   By: Elberta Fortisaniel  Boyle M.D.   On: 02/15/2015 14:07   Ct Head Wo Contrast  02/15/2015   CLINICAL DATA:  Loss of consciousness, difficulty walking this morning.  EXAM: CT HEAD WITHOUT CONTRAST  TECHNIQUE: Contiguous axial images were obtained from the  base of the skull through the vertex without intravenous contrast.  COMPARISON:  July 03, 2014  FINDINGS: There is chronic diffuse atrophy. Chronic bilateral periventricular white matter small vessel ischemic changes identified there is an old infarct in the right parietal frontal white matter unchanged. There is no midline shift hydrocephalus or mass. No acute hemorrhage or acute transcortical infarct is identified. The bony calvarium is intact. The visualized sinuses are clear.  IMPRESSION: No focal acute intracranial abnormality identified.  Chronic diffuse atrophy. Chronic bilateral periventricular white matter small vessel ischemic change.   Electronically Signed   By: Sherian ReinWei-Chen  Lin M.D.   On: 02/15/2015 14:19    Scheduled Meds: . donepezil  5 mg Oral QHS  . feeding supplement (ENSURE ENLIVE)  237 mL Oral BID BM  . loratadine  10 mg Oral Daily  . memantine  10 mg Oral BID  . multivitamin with minerals  1 tablet Oral q morning - 10a  . pantoprazole  80 mg Oral Daily  . risperiDONE  0.5 mg Oral QHS  . sodium chloride  3 mL Intravenous Q12H  . Warfarin - Pharmacist Dosing Inpatient   Does not apply q1800   Continuous Infusions:   Active Problems:   Dementia   Essential hypertension   Chronic anticoagulation   ASCVD (arteriosclerotic cardiovascular disease)   Altered mental status   Loss of consciousness   Syncope   Atrial fibrillation    Time spent: 30 minutes    [facility] For SurgeriesBLACK,Angela Stelmach M  [facility] Pager [phone]. If 7PM-7AM, please contact night-coverage at [email or web address], password [facility] 02/16/2015, 10:18 AM  LOS: 1 day

## 2015-02-16 NOTE — Evaluation (Addendum)
Physical Therapy Evaluation Patient Details Name: Angela [name] MRN: [id number] DOB: 03-08-1921 Today's Date: 02/16/2015   History of Present Illness  Angela [name] is a 81 y.o. female has a past medical history significant for HTN, HLD, CAD, Aortic stenosis, diastolic CHF, is being brought to the hospital by her family due to an episode this morning when they could not wake her up. Per Patient's daughter, they have in-house help , and this morning she was unable to wake the patient up. Per the aide's report, patient's had a hip difficulty breathing, and at one point appeared to have stopped breathing for a few seconds. There are no reported loss of pulse. Patient has underlying severe dementia, is noncommunicative , and doesn't usually complain of anything. There are no reported fever or chills, and she was at her baseline otherwise up until the night before. Per reports, EMS did not notice pulse loss or breathing difficulties, reported a heart rate of ~30 however no strips are available. In the ED patient's vital signs are normal, she is not bradycardic, she has normal BMP and CBC. Her INR is 1.84. [facility] asked for admission for syncopal episode.  Clinical Impression   Pt has advanced dementia and was seen for an evaluation of function.   Family present.  She is currently found to be at prior functional level, able to ambulate 150' with a walker and close supervision.  Family has all needed DME.  No further PT is indicated.    Follow Up Recommendations No PT follow up    Equipment Recommendations  None recommended by PT    Recommendations for Other Services   none    Precautions / Restrictions Precautions Precautions: Fall Restrictions Weight Bearing Restrictions: No      Mobility  Bed Mobility               General bed mobility comments: not evaluated, pt up in chair  Transfers Overall transfer level: Needs assistance Equipment used: Rolling walker (2 wheeled) Transfers:  Sit to/from Stand Sit to Stand: Min guard            Ambulation/Gait Ambulation/Gait assistance: Min guard Ambulation Distance (Feet): 150 Feet Assistive device: Rolling walker (2 wheeled) Gait Pattern/deviations: Trunk flexed   Gait velocity interpretation: at or above normal speed for age/gender    Stairs            Wheelchair Mobility    Modified Rankin (Stroke Patients Only)       Balance Overall balance assessment: Needs assistance         Standing balance support: Bilateral upper extremity supported Standing balance-Leahy Scale: Fair                               Pertinent Vitals/Pain Pain Assessment: No/denies pain    Home Living Family/patient expects to be discharged to:: Private residence Living Arrangements: Children Available Help at Discharge: Family;Personal care attendant;Available 24 hours/day Type of Home: House Home Access: Ramped entrance     Home Layout: One level Home Equipment: Hospital bed;Walker - 2 wheels;Wheelchair - manual Additional Comments: pt has assistance when ambulating with a walker    Prior Function Level of Independence: Needs assistance   Gait / Transfers Assistance Needed: assist for all transfers and gait with a walker  ADL's / Homemaking Assistance Needed: total assist with ADLs        Hand Dominance  Extremity/Trunk Assessment   Upper Extremity Assessment: Overall WFL for tasks assessed           Lower Extremity Assessment: Overall WFL for tasks assessed      Cervical / Trunk Assessment: Kyphotic (severe)  Communication   Communication:  (minimally verbal)  Cognition Arousal/Alertness: Awake/alert Behavior During Therapy: Agitated (just received Ativan (scheduled)) Overall Cognitive Status: History of cognitive impairments - at baseline                      General Comments      Exercises        Assessment/Plan    PT Assessment Patent does not need  any further PT services  PT Diagnosis     PT Problem List    PT Treatment Interventions     PT Goals (Current goals can be found in the Care Plan section) Acute Rehab PT Goals PT Goal Formulation: All assessment and education complete, DC therapy    Frequency     Barriers to discharge        Co-evaluation               End of Session Equipment Utilized During Treatment: Gait belt Activity Tolerance: Patient tolerated treatment well Patient left: in chair;with chair alarm set;with nursing/sitter in room;with family/visitor present Nurse Communication: Mobility status                                                                                                                                                                                                                                                                                                                                    Time: 1134-1220 PT Time Calculation (min) (ACUTE ONLY): 46 min   Charges:   PT Evaluation $Initial PT Evaluation Tier I: 1 Procedure     PT G Codes:    Clinical Judgement  Mobility  Evaluation:  CI  Goal:  CI  Discharge:  CI    Konrad PentaBrown, Artis Buechele L  PT 02/16/2015, 12:42 PM

## 2015-02-16 NOTE — Care Management Note (Signed)
Case Management Note  Patient Details  Name: Angela [name] MRN: [id number] Date of Birth: 09/14/1921  Subjective/Objective:                    Action/Plan:   Expected Discharge Date:  02/17/15               Expected Discharge Plan:  Home w Home Health Services  In-House Referral:  NA  Discharge planning Services  CM Consult  Post Acute Care Choice:  Home Health Choice offered to:  Adult Children  DME Arranged:    DME Agency:     HH Arranged:    HH Agency:     Status of Service:  In process, will continue to follow  Medicare Important Message Given:    Date Medicare IM Given:    Medicare IM give by:    Date Additional Medicare IM Given:    Additional Medicare Important Message give by:     If discussed at Long Length of Stay Meetings, dates discussed:    Additional Comments: Per 2nd level review pt is OBS. Dr. Rhona Leavenshiu agrees and order changed. CC 44 given. Arlyss QueenBlackwell, Braylee Lal [place], RN 02/16/2015, 3:27 PM

## 2015-02-17 DIAGNOSIS — F0390 Unspecified dementia without behavioral disturbance: Secondary | ICD-10-CM | POA: Diagnosis not present

## 2015-02-17 DIAGNOSIS — Z7901 Long term (current) use of anticoagulants: Secondary | ICD-10-CM | POA: Diagnosis not present

## 2015-02-17 DIAGNOSIS — R55 Syncope and collapse: Secondary | ICD-10-CM | POA: Diagnosis not present

## 2015-02-17 DIAGNOSIS — I2510 Atherosclerotic heart disease of native coronary artery without angina pectoris: Secondary | ICD-10-CM | POA: Diagnosis not present

## 2015-02-17 DIAGNOSIS — R410 Disorientation, unspecified: Secondary | ICD-10-CM | POA: Diagnosis not present

## 2015-02-17 LAB — VITAMIN B12: Vitamin B-12: 2067 pg/mL — ABNORMAL HIGH (ref 180–914)

## 2015-02-17 LAB — PROTIME-INR
INR: 2.36 — ABNORMAL HIGH (ref 0.00–1.49)
Prothrombin Time: 26 seconds — ABNORMAL HIGH (ref 11.6–15.2)

## 2015-02-17 LAB — TSH: TSH: 2.068 u[IU]/mL (ref 0.350–4.500)

## 2015-02-17 MED ORDER — SODIUM CHLORIDE 0.9 % IV SOLN: INTRAVENOUS | Status: AC

## 2015-02-17 MED ORDER — WARFARIN SODIUM 5 MG PO TABS
2.5000 mg | ORAL_TABLET | Freq: Once | ORAL | Status: AC
  Administered 2015-02-17: 2.5 mg via ORAL
  Filled 2015-02-17: qty 1

## 2015-02-17 NOTE — Progress Notes (Signed)
[facility] PROGRESS NOTE  Angela [name] ZOX:[id number] DOB: 10/07/1920 DOA: 02/15/2015 PCP: Cassell SmilesFUSCO,LAWRENCE J., MD  Assessment/Plan: ?Syncopal episode / decreased responsiveness - likely related to orthostasi secondary to dehydration due to decreased po intake.  No strips available for review. No events on tele. CT head without acute abnormality. No reported medication changes, of note she is not on any nodal blocking agents. She has a history of aortic stenosis. 2-D echo with EF 60% with mild LVH. Troponin negative x3. UA unremarkable. Check TSH, B12 pending. Somewhat orthostatic from lying to sitting.very lethargic this am likely related to risperadol and ativan given last night for confusion/delerium. Will discontinue. Will recheck orthostatic VS in am. Avoid sedating medication   CAD - Denies chest pain, troponin negative. No events on tele.   Chronic diastolic heart failure - remains stable. Echo as noted above.  Daily weights and intake and output. Home meds include lasix, lisinopril. Both remain on hold due to soft BP   Dementia - Lethargic as noted above. Was close to baseline yesterday   HTN - BP soft. Continue to hold Lasix. Provide IV fluid. monitor   A fib - rate controlled. On coumadin INR 1.84 today. Daughter reported multiple falls recently. Consider discussing with cardiology about anticoagulation risks/benefits  Aortic stenosis - echo with 60% EF mild LVH.     Code Status: DNR Family Communication: daughters at bedside Disposition Plan: home when ready hopefully tomorrow   Consultants:  none  Procedures:  none  Antibiotics:  none  HPI/Subjective: Lying in bed eyes closed. Does not respond to verbal stimuli   Objective: Filed Vitals:   02/17/15 0659  BP: 93/74  Pulse: 88  Temp: 97.5 F (36.4 C)  Resp: 18    Intake/Output Summary (Last 24 hours) at 02/17/15 1150 Last data filed at 02/17/15 0042  Gross per 24 hour  Intake 540.83 ml   Output      0 ml  Net 540.83 ml   Filed Weights   02/16/15 0100  Weight: 54 kg (119 lb 0.8 oz)    Exam:   General:  Thin frail pale  Cardiovascular: irregularly irregular +murmur no LE edema  Respiratory: normal effort somewhat shallow, BS with somewhat diminished movement no wheeze  Abdomen: soft non-distended non-tender +BS   Musculoskeletal: joints without swelling/erythema   Data Reviewed: Basic Metabolic Panel:  Recent Labs Lab 02/15/15 1505 02/16/15 0633  NA 138 141  K 4.3 4.0  CL 106 103  CO2 28 31  GLUCOSE 90 101*  BUN 18 15  CREATININE 0.84 0.87  CALCIUM 8.7* 9.1   Liver Function Tests: No results for input(s): AST, ALT, ALKPHOS, BILITOT, PROT, ALBUMIN in the last 168 hours.  Recent Labs Lab 02/15/15 1241  LIPASE 32   No results for input(s): AMMONIA in the last 168 hours. CBC:  Recent Labs Lab 02/15/15 1241 02/16/15 0633  WBC 8.1 9.6  NEUTROABS 5.3  --   HGB 13.6 14.3  HCT 43.7 45.5  MCV 94.6 93.8  PLT 229 217   Cardiac Enzymes:  Recent Labs Lab 02/15/15 1505 02/15/15 2230 02/16/15 0328 02/16/15 0946  TROPONINI <0.03 <0.03 <0.03 0.03   BNP (last 3 results) No results for input(s): BNP in the last 8760 hours.  ProBNP (last 3 results) No results for input(s): PROBNP in the last 8760 hours.  CBG: No results for input(s): GLUCAP in the last 168 hours.  No results found for this or any previous visit (from the past 240 hour(s)).  Studies: Dg Chest 1 View  02/15/2015   CLINICAL DATA:  Loss of consciousness.  EXAM: CHEST  1 VIEW  COMPARISON:  06/21/2014 and 01/29/2012  FINDINGS: Lungs are adequately inflated with a stable small nodular opacity over the right perihilar region. No focal consolidation or effusion. Mild stable cardiomegaly. There is calcified plaque over the aortic arch. Degenerative change of the spine.  IMPRESSION: No acute cardiopulmonary disease.  Mild cardiomegaly.   Electronically Signed   By: Elberta Fortisaniel  Boyle  M.D.   On: 02/15/2015 14:07   Ct Head Wo Contrast  02/15/2015   CLINICAL DATA:  Loss of consciousness, difficulty walking this morning.  EXAM: CT HEAD WITHOUT CONTRAST  TECHNIQUE: Contiguous axial images were obtained from the base of the skull through the vertex without intravenous contrast.  COMPARISON:  July 03, 2014  FINDINGS: There is chronic diffuse atrophy. Chronic bilateral periventricular white matter small vessel ischemic changes identified there is an old infarct in the right parietal frontal white matter unchanged. There is no midline shift hydrocephalus or mass. No acute hemorrhage or acute transcortical infarct is identified. The bony calvarium is intact. The visualized sinuses are clear.  IMPRESSION: No focal acute intracranial abnormality identified.  Chronic diffuse atrophy. Chronic bilateral periventricular white matter small vessel ischemic change.   Electronically Signed   By: Sherian ReinWei-Chen  Lin M.D.   On: 02/15/2015 14:19    Scheduled Meds: . donepezil  5 mg Oral QHS  . feeding supplement (ENSURE ENLIVE)  237 mL Oral BID BM  . loratadine  10 mg Oral Daily  . memantine  10 mg Oral BID  . multivitamin with minerals  1 tablet Oral q morning - 10a  . pantoprazole  80 mg Oral Daily  . sodium chloride  3 mL Intravenous Q12H  . warfarin  2.5 mg Oral Once  . Warfarin - Pharmacist Dosing Inpatient   Does not apply Q24H   Continuous Infusions: . sodium chloride      Active Problems:   Dementia   Essential hypertension   Chronic anticoagulation   ASCVD (arteriosclerotic cardiovascular disease)   Altered mental status   Loss of consciousness   Syncope   Atrial fibrillation    Time spent: 20 minutes    [facility] Th[facility] OaksBLACK,Lacrecia Delval M  [facility] Pager [phone]. If 7PM-7AM, please contact night-coverage at [email or web address], password [facility] 02/17/2015, 11:50 AM  LOS: 2 days

## 2015-02-17 NOTE — Progress Notes (Signed)
ANTICOAGULATION CONSULT NOTE  Pharmacy Consult for Coumadin Indication: atrial fibrillation  Allergies  Allergen Reactions  . Haldol [Haloperidol Decanoate]     Over sedation  . Sulfa Antibiotics Other (See Comments)    uknown    Patient Measurements: Height: 5\' 4"  (162.6 cm) Weight: 119 lb 0.8 oz (54 kg) IBW/kg (Calculated) : 54.7   Vital Signs: Temp: 97.5 F (36.4 C) (05/17 0659) Temp Source: Axillary (05/17 0659) BP: 93/74 mmHg (05/17 0659) Pulse Rate: 88 (05/17 0659)  Labs:  Recent Labs  02/15/15 1241  02/15/15 1505 02/15/15 2230 02/16/15 0328 02/16/15 0633 02/16/15 0946  HGB 13.6  --   --   --   --  14.3  --   HCT 43.7  --   --   --   --  45.5  --   PLT 229  --   --   --   --  217  --   LABPROT 21.4*  --   --   --   --  22.7*  --   INR 1.84*  --   --   --   --  1.98*  --   CREATININE  --   --  0.84  --   --  0.87  --   TROPONINI  --   < > <0.03 <0.03 <0.03  --  0.03  < > = values in this interval not displayed.  Estimated Creatinine Clearance: 35.9 mL/min (by C-G formula based on Cr of 0.87).  Medical History: Past Medical History  Diagnosis Date  . ASCVD (arteriosclerotic cardiovascular disease)     acute MI in 1993;40%left main,80%LAD, 100%RCA-PTCA; EF of 40%;stress nuclear in2007; normal EF;  mixed inferolateral ischemia and infarction; CHF with normal EF in 2011  . CHF (congestive heart failure)     normal EF;mixed inferolateral ischemic and infarction;chf with normal EF in 2011  . Hypertension   . Hyperlipidemia   . Cerebrovascular disease     CVA by CT in 2007,but not noted in 2009;duplex in 12/2006 plaque without stenosis; Emergency Room evaluation in 12/2010 for transient aphasia and paresis-clopidogrel initially added to ASA Rx, but subsequently warfarin was substituted for these 2 agents  . Tobacco abuse, in remission     quit in 1995  . Claudication   . Dementia     mild  . Kyphosis     severe  . Decreased hearing   . Atrial fibrillation    . Chronic anticoagulation   . TIA (transient ischemic attack)   . Renal disorder   . Glass eye     right-1990  . HOH (hard of hearing)   . Wears glasses   . GERD (gastroesophageal reflux disease)   . Alzheimer's dementia   . COPD (chronic obstructive pulmonary disease)    Medications:  Prescriptions prior to admission  Medication Sig Dispense Refill Last Dose  . albuterol (PROVENTIL) (2.5 MG/3ML) 0.083% nebulizer solution Take 2.5 mg by nebulization every 4 (four) hours as needed. For shortness of breath/cough   02/14/2015 at Unknown time  . alendronate (FOSAMAX) 70 MG tablet Take 70 mg by mouth every 7 (seven) days. Take with a full glass of water on an empty stomach.    Past Week at Unknown time  . cetirizine (ZYRTEC) 10 MG tablet Take 10 mg by mouth at bedtime.    02/14/2015 at Unknown time  . Cyanocobalamin (VITAMIN B-12) 5000 MCG SUBL Place 5,000 mcg under the tongue daily.   02/14/2015 at Unknown time  .  donepezil (ARICEPT) 5 MG tablet Take 5 mg by mouth at bedtime.   02/14/2015 at Unknown time  . furosemide (LASIX) 40 MG tablet Take 40 mg by mouth as needed for fluid.    02/14/2015 at Unknown time  . lisinopril (PRINIVIL,ZESTRIL) 2.5 MG tablet Take 2.5 mg by mouth every morning.    02/14/2015 at Unknown time  . LORazepam (ATIVAN) 0.5 MG tablet Take 0.5 mg by mouth 3 (three) times daily. Anxiety and or agitation. *Last dose is not to be given, if patient receives sleeping medication HALCION (TRIAZOLAM)*   02/14/2015 at Unknown time  . memantine (NAMENDA) 10 MG tablet Take 10 mg by mouth 2 (two) times daily.    02/14/2015 at Unknown time  . Multiple Vitamins-Minerals (MULTIVITAMIN WITH MINERALS) tablet Take 1 tablet by mouth every morning.    02/14/2015 at Unknown time  . nitroGLYCERIN (NITROSTAT) 0.4 MG SL tablet Place 0.4 mg under the tongue every 5 (five) minutes as needed. For chest pain    unknown  . omeprazole (PRILOSEC) 40 MG capsule Take 40 mg by mouth daily.   02/14/2015 at Unknown  time  . potassium chloride SA (K-DUR,KLOR-CON) 20 MEQ tablet Take 20 mEq by mouth daily.   02/14/2015 at Unknown time  . risperiDONE (RISPERDAL) 0.5 MG tablet Take 0.5 mg by mouth at bedtime.   02/14/2015 at Unknown time  . Salicylic Acid (SALEX) 6 % SHAM Apply 1 application topically once a week. For psoriasis   Past Week at Unknown time  . warfarin (COUMADIN) 2.5 MG tablet Take 1.25-2.5 mg by mouth See admin instructions. Take 1.25 mg on Sunday and Wednesday.  Take 2.5 mg all other days.   02/14/2015 at 1900  . traMADol (ULTRAM) 50 MG tablet Take 1 tablet (50 mg total) by mouth every 6 (six) hours as needed. (Patient not taking: Reported on 02/15/2015) 21 tablet 0    Assessment: 81 yo F on chronic Coumadin for AFIB.  Home dose listed above.  INR sub therapeutic on admission, but trending to goal on home regimen.   Daughter reports multiple falls recently.  No bleeding noted.   Goal of Therapy:  INR 2-3   Plan:  Coumadin 2.5 mg po x 1 INR daily  Elson ClanLilliston, Dontravious Camille Michelle 02/17/2015,11:07 AM

## 2015-02-17 NOTE — Plan of Care (Signed)
Problem: Phase I Progression Outcomes Goal: Pain controlled with appropriate interventions Outcome: Completed/Met Date Met:  02/17/15 Patient denies pain when asked. No signs or indications of pain noted.

## 2015-02-18 DIAGNOSIS — I4891 Unspecified atrial fibrillation: Secondary | ICD-10-CM

## 2015-02-18 DIAGNOSIS — R4182 Altered mental status, unspecified: Secondary | ICD-10-CM | POA: Diagnosis not present

## 2015-02-18 DIAGNOSIS — F0390 Unspecified dementia without behavioral disturbance: Secondary | ICD-10-CM | POA: Diagnosis not present

## 2015-02-18 DIAGNOSIS — R55 Syncope and collapse: Secondary | ICD-10-CM | POA: Diagnosis not present

## 2015-02-18 DIAGNOSIS — I10 Essential (primary) hypertension: Secondary | ICD-10-CM | POA: Diagnosis not present

## 2015-02-18 LAB — PROTIME-INR
INR: 2.53 — ABNORMAL HIGH (ref 0.00–1.49)
PROTHROMBIN TIME: 27.5 s — AB (ref 11.6–15.2)

## 2015-02-18 MED ORDER — LORAZEPAM 0.5 MG PO TABS: 0.5000 mg | ORAL_TABLET | Freq: Two times a day (BID) | ORAL | Status: DC | PRN

## 2015-02-18 MED ORDER — FUROSEMIDE 40 MG PO TABS: 20.0000 mg | ORAL_TABLET | ORAL | Status: DC | PRN

## 2015-02-18 MED ORDER — ENSURE ENLIVE PO LIQD: 237.0000 mL | Freq: Two times a day (BID) | ORAL | Status: DC

## 2015-02-18 MED ORDER — POTASSIUM CHLORIDE CRYS ER 20 MEQ PO TBCR: 20.0000 meq | EXTENDED_RELEASE_TABLET | Freq: Every day | ORAL | Status: DC

## 2015-02-18 NOTE — Care Management Note (Signed)
Case Management Note  Patient Details  Name: Angela [name] MRN: [id number] Date of Birth: 07/16/1921  Subjective/Objective:                    Action/Plan:   Expected Discharge Date:  02/17/15               Expected Discharge Plan:  Home w Home Health Services  In-House Referral:  NA  Discharge planning Services  CM Consult  Post Acute Care Choice:  Home Health Choice offered to:  Adult Children  DME Arranged:    DME Agency:     HH Arranged:  RN HH Agency:  [facility]  Status of Service:  Completed, signed off  Medicare Important Message Given:    Date Medicare IM Given:    Medicare IM give by:    Date Additional Medicare IM Given:    Additional Medicare Important Message give by:     If discussed at Long Length of Stay Meetings, dates discussed:    Additional Comments: [facility] Alroy BailiffLinda Lothian of [facility] is aware and will collect the pts information from the chart. HH services to start within 48 hours of discharge. No DME needs noted. Pt and pts nurse aware of discharge arrangements. Arlyss QueenBlackwell, Vick Filter Poy S[place]rowder, RN 02/18/2015, 3:53 PM

## 2015-02-18 NOTE — Discharge Summary (Signed)
Physician Discharge Summary  Angela [name] ZOX:[id number] DOB: 02-08-1921 DOA: 02/15/2015  PCP: Cassell Smiles., MD  Admit date: 02/15/2015 Discharge date: 02/18/2015  Time spent: 40 minutes  Recommendations for Outpatient Follow-up:  1. Follow up with PCP as scheduled 02/25/15 for evaluation of BP, volume status and over sedation. 2. HH RN for skilled assessment and observation  Discharge Diagnoses:  Active Problems:   Dementia   Essential hypertension   Chronic anticoagulation   ASCVD (arteriosclerotic cardiovascular disease)   Altered mental status   Loss of consciousness   Syncope   Atrial fibrillation   Discharge Condition: stable  Diet recommendation: regular (whatever she will eat)  Filed Weights   02/16/15 0100  Weight: 54 kg (119 lb 0.8 oz)    History of present illness:  Angela [name] is a 81 y.o. female has a past medical history significant for HTN, HLD, CAD, Aortic stenosis, diastolic CHF,   brought to the hospital on 02/15/15 by her family due to an episode when they could not wake her up. Per Patient's daughter, they have in-house help , and that morning she was unable to wake the patient up. Per the aide's report, patient's had  difficulty breathing, and at one point appeared to have stopped breathing for a few seconds. There were no reported loss of pulse. Patient has underlying severe dementia, is noncommunicative , and doesn't usually complain of anything. There were no reported fever or chills, and she was at her baseline otherwise up until the night before. Per reports, EMS did not notice pulse loss or breathing difficulties, reported a heart rate of ~30 however no strips are available. In the ED patient's vital signs were normal, she was not bradycardic, she had normal BMP and CBC. Her INR was 1.84.    Hospital Course:  ?Syncopal episode / decreased responsiveness - likely related to orthostasi secondary to dehydration due to decreased po intake possibly  related to oversedation. No strips available for review. No events on tele. CT head without acute abnormality. No reported medication changes, of note she is not on any nodal blocking agents. She has a history of aortic stenosis. 2-D echo with EF 60% with mild LVH. Troponin negative x3. UA unremarkable. TSH within limits of normal, B12 2067.  Initially somewhat orthostatic from lying to sitting.provided with fluids and this resolved at discharge. Did have episode of lethargy likely related to multiple sedatives given for night time confusion. Home benzo and  risperadol discontinued and at discharge patient alert oriented to self only and cooperative. Will discharge with decreased ativan dose. Discussed medications with family and dangers of oversedation.   CAD - Denied chest pain, troponin negative. No events on tele.   Chronic diastolic heart failure - remained stable. Echo as noted above.will resume lisinopril at discharge and decrease lasix dose.    Dementia - at baseline at discharge   HTN - BP soft as noted above. Improved at discharge. Lisinopril resumed and lasix decreased   A fib - rate controlled. INR 2.36  Aortic stenosis - echo with 60% EF mild LVH.    Procedures:  none  Consultations:  none  Discharge Exam: Filed Vitals:   02/18/15 0544  BP: 154/99  Pulse: 72  Temp: 97.8 F (36.6 C)  Resp: 20    General: thin frail pail Cardiovascular: irreguarly irregular +murmur no LE edema PPP  Respiratory: normal effort BS clear no wheeze Neuro: alert oriented to self only, feeding self unable to make wants/needs known  Discharge Instructions    Current Discharge Medication List    START taking these medications   Details  feeding supplement, ENSURE ENLIVE, (ENSURE ENLIVE) LIQD Take 237 mLs by mouth 2 (two) times daily between meals. Qty: 237 mL, Refills: 12      CONTINUE these medications which have CHANGED   Details  furosemide (LASIX) 40 MG tablet  Take 0.5 tablets (20 mg total) by mouth as needed for fluid or edema. Qty: 30 tablet, Refills: 0    LORazepam (ATIVAN) 0.5 MG tablet Take 1 tablet (0.5 mg total) by mouth every 12 (twelve) hours as needed for anxiety. Anxiety and or agitation. *Last dose is not to be given, if patient receives sleeping medication HALCION (TRIAZOLAM)* Qty: 30 tablet, Refills: 0    potassium chloride SA (K-DUR,KLOR-CON) 20 MEQ tablet Take 1 tablet (20 mEq total) by mouth daily. 1 tablet on days that lasix is taken      CONTINUE these medications which have NOT CHANGED   Details  albuterol (PROVENTIL) (2.5 MG/3ML) 0.083% nebulizer solution Take 2.5 mg by nebulization every 4 (four) hours as needed. For shortness of breath/cough    alendronate (FOSAMAX) 70 MG tablet Take 70 mg by mouth every 7 (seven) days. Take with a full glass of water on an empty stomach.     cetirizine (ZYRTEC) 10 MG tablet Take 10 mg by mouth at bedtime.     Cyanocobalamin (VITAMIN B-12) 5000 MCG SUBL Place 5,000 mcg under the tongue daily.    donepezil (ARICEPT) 5 MG tablet Take 5 mg by mouth at bedtime.    lisinopril (PRINIVIL,ZESTRIL) 2.5 MG tablet Take 2.5 mg by mouth every morning.     memantine (NAMENDA) 10 MG tablet Take 10 mg by mouth 2 (two) times daily.     Multiple Vitamins-Minerals (MULTIVITAMIN WITH MINERALS) tablet Take 1 tablet by mouth every morning.     nitroGLYCERIN (NITROSTAT) 0.4 MG SL tablet Place 0.4 mg under the tongue every 5 (five) minutes as needed. For chest pain     omeprazole (PRILOSEC) 40 MG capsule Take 40 mg by mouth daily.    risperiDONE (RISPERDAL) 0.5 MG tablet Take 0.5 mg by mouth at bedtime.    Salicylic Acid (SALEX) 6 % SHAM Apply 1 application topically once a week. For psoriasis    warfarin (COUMADIN) 2.5 MG tablet Take 1.25-2.5 mg by mouth See admin instructions. Take 1.25 mg on Sunday and Wednesday.  Take 2.5 mg all other days.      STOP taking these medications     traMADol (ULTRAM)  50 MG tablet        Allergies  Allergen Reactions  . Haldol [Haloperidol Decanoate]     Over sedation  . Sulfa Antibiotics Other (See Comments)    uknown   Follow-up Information    Follow up with Cassell SmilesFUSCO,Angela J., MD On 02/25/2015.   Specialty:  Internal Medicine   Why:  at  1:30 pm   Contact information:   [address] [place] [address] [phone]        The results of significant diagnostics from this hospitalization (including imaging, microbiology, ancillary and laboratory) are listed below for reference.    Significant Diagnostic Studies: Dg Chest 1 View  02/15/2015   CLINICAL DATA:  Loss of consciousness.  EXAM: CHEST  1 VIEW  COMPARISON:  06/21/2014 and 01/29/2012  FINDINGS: Lungs are adequately inflated with a stable small nodular opacity over the right perihilar region. No focal consolidation or effusion. Mild stable  cardiomegaly. There is calcified plaque over the aortic arch. Degenerative change of the spine.  IMPRESSION: No acute cardiopulmonary disease.  Mild cardiomegaly.   Electronically Signed   By: Elberta Fortisaniel  Boyle M.D.   On: 02/15/2015 14:07   Ct Head Wo Contrast  02/15/2015   CLINICAL DATA:  Loss of consciousness, difficulty walking this morning.  EXAM: CT HEAD WITHOUT CONTRAST  TECHNIQUE: Contiguous axial images were obtained from the base of the skull through the vertex without intravenous contrast.  COMPARISON:  July 03, 2014  FINDINGS: There is chronic diffuse atrophy. Chronic bilateral periventricular white matter small vessel ischemic changes identified there is an old infarct in the right parietal frontal white matter unchanged. There is no midline shift hydrocephalus or mass. No acute hemorrhage or acute transcortical infarct is identified. The bony calvarium is intact. The visualized sinuses are clear.  IMPRESSION: No focal acute intracranial abnormality identified.  Chronic diffuse atrophy. Chronic bilateral periventricular white matter small  vessel ischemic change.   Electronically Signed   By: Sherian ReinWei-Chen  Lin M.D.   On: 02/15/2015 14:19    Microbiology: No results found for this or any previous visit (from the past 240 hour(s)).   Labs: Basic Metabolic Panel:  Recent Labs Lab 02/15/15 1505 02/16/15 0633  NA 138 141  K 4.3 4.0  CL 106 103  CO2 28 31  GLUCOSE 90 101*  BUN 18 15  CREATININE 0.84 0.87  CALCIUM 8.7* 9.1   Liver Function Tests: No results for input(s): AST, ALT, ALKPHOS, BILITOT, PROT, ALBUMIN in the last 168 hours.  Recent Labs Lab 02/15/15 1241  LIPASE 32   No results for input(s): AMMONIA in the last 168 hours. CBC:  Recent Labs Lab 02/15/15 1241 02/16/15 0633  WBC 8.1 9.6  NEUTROABS 5.3  --   HGB 13.6 14.3  HCT 43.7 45.5  MCV 94.6 93.8  PLT 229 217   Cardiac Enzymes:  Recent Labs Lab 02/15/15 1505 02/15/15 2230 02/16/15 0328 02/16/15 0946  TROPONINI <0.03 <0.03 <0.03 0.03   BNP: BNP (last 3 results) No results for input(s): BNP in the last 8760 hours.  ProBNP (last 3 results) No results for input(s): PROBNP in the last 8760 hours.  CBG: No results for input(s): GLUCAP in the last 168 hours.     SignedGwenyth Bender:  Zakhai Meisinger M  [facility] 02/18/2015, 10:10 AM

## 2015-02-18 NOTE — Plan of Care (Signed)
Problem: Discharge Progression Outcomes Goal: Activity appropriate for discharge plan Outcome: Completed/Met Date Met:  02/18/15 At base line Goal: Other Discharge Outcomes/Goals Outcome: Completed/Met Date Met:  02/18/15 [facility] home care to see patient

## 2015-02-19 DIAGNOSIS — I2510 Atherosclerotic heart disease of native coronary artery without angina pectoris: Secondary | ICD-10-CM | POA: Diagnosis not present

## 2015-02-19 DIAGNOSIS — I4891 Unspecified atrial fibrillation: Secondary | ICD-10-CM | POA: Diagnosis not present

## 2015-02-19 DIAGNOSIS — J449 Chronic obstructive pulmonary disease, unspecified: Secondary | ICD-10-CM | POA: Diagnosis not present

## 2015-02-19 DIAGNOSIS — Z8673 Personal history of transient ischemic attack (TIA), and cerebral infarction without residual deficits: Secondary | ICD-10-CM | POA: Diagnosis not present

## 2015-02-19 DIAGNOSIS — I509 Heart failure, unspecified: Secondary | ICD-10-CM | POA: Diagnosis not present

## 2015-02-19 DIAGNOSIS — G309 Alzheimer's disease, unspecified: Secondary | ICD-10-CM | POA: Diagnosis not present

## 2015-02-19 DIAGNOSIS — L89322 Pressure ulcer of left buttock, stage 2: Secondary | ICD-10-CM | POA: Diagnosis not present

## 2015-02-19 DIAGNOSIS — H3530 Unspecified macular degeneration: Secondary | ICD-10-CM | POA: Diagnosis not present

## 2015-02-19 DIAGNOSIS — F0280 Dementia in other diseases classified elsewhere without behavioral disturbance: Secondary | ICD-10-CM | POA: Diagnosis not present

## 2015-02-19 DIAGNOSIS — R32 Unspecified urinary incontinence: Secondary | ICD-10-CM | POA: Diagnosis not present

## 2015-02-20 DIAGNOSIS — R32 Unspecified urinary incontinence: Secondary | ICD-10-CM | POA: Diagnosis not present

## 2015-02-21 DIAGNOSIS — Z8673 Personal history of transient ischemic attack (TIA), and cerebral infarction without residual deficits: Secondary | ICD-10-CM | POA: Diagnosis not present

## 2015-02-21 DIAGNOSIS — G309 Alzheimer's disease, unspecified: Secondary | ICD-10-CM | POA: Diagnosis not present

## 2015-02-21 DIAGNOSIS — L89322 Pressure ulcer of left buttock, stage 2: Secondary | ICD-10-CM | POA: Diagnosis not present

## 2015-02-21 DIAGNOSIS — F0280 Dementia in other diseases classified elsewhere without behavioral disturbance: Secondary | ICD-10-CM | POA: Diagnosis not present

## 2015-02-21 DIAGNOSIS — I2510 Atherosclerotic heart disease of native coronary artery without angina pectoris: Secondary | ICD-10-CM | POA: Diagnosis not present

## 2015-02-21 DIAGNOSIS — R32 Unspecified urinary incontinence: Secondary | ICD-10-CM | POA: Diagnosis not present

## 2015-02-21 DIAGNOSIS — I509 Heart failure, unspecified: Secondary | ICD-10-CM | POA: Diagnosis not present

## 2015-02-21 DIAGNOSIS — H3530 Unspecified macular degeneration: Secondary | ICD-10-CM | POA: Diagnosis not present

## 2015-02-21 DIAGNOSIS — J449 Chronic obstructive pulmonary disease, unspecified: Secondary | ICD-10-CM | POA: Diagnosis not present

## 2015-02-21 DIAGNOSIS — I4891 Unspecified atrial fibrillation: Secondary | ICD-10-CM | POA: Diagnosis not present

## 2015-02-25 DIAGNOSIS — N182 Chronic kidney disease, stage 2 (mild): Secondary | ICD-10-CM | POA: Diagnosis not present

## 2015-02-25 DIAGNOSIS — I10 Essential (primary) hypertension: Secondary | ICD-10-CM | POA: Diagnosis not present

## 2015-02-25 DIAGNOSIS — Z681 Body mass index (BMI) 19 or less, adult: Secondary | ICD-10-CM | POA: Diagnosis not present

## 2015-02-25 DIAGNOSIS — E860 Dehydration: Secondary | ICD-10-CM | POA: Diagnosis not present

## 2015-02-25 DIAGNOSIS — J449 Chronic obstructive pulmonary disease, unspecified: Secondary | ICD-10-CM | POA: Diagnosis not present

## 2015-02-26 DIAGNOSIS — R32 Unspecified urinary incontinence: Secondary | ICD-10-CM | POA: Diagnosis not present

## 2015-02-26 DIAGNOSIS — F0280 Dementia in other diseases classified elsewhere without behavioral disturbance: Secondary | ICD-10-CM | POA: Diagnosis not present

## 2015-02-26 DIAGNOSIS — Z8673 Personal history of transient ischemic attack (TIA), and cerebral infarction without residual deficits: Secondary | ICD-10-CM | POA: Diagnosis not present

## 2015-02-26 DIAGNOSIS — I509 Heart failure, unspecified: Secondary | ICD-10-CM | POA: Diagnosis not present

## 2015-02-26 DIAGNOSIS — I2510 Atherosclerotic heart disease of native coronary artery without angina pectoris: Secondary | ICD-10-CM | POA: Diagnosis not present

## 2015-02-26 DIAGNOSIS — G309 Alzheimer's disease, unspecified: Secondary | ICD-10-CM | POA: Diagnosis not present

## 2015-02-26 DIAGNOSIS — J449 Chronic obstructive pulmonary disease, unspecified: Secondary | ICD-10-CM | POA: Diagnosis not present

## 2015-02-26 DIAGNOSIS — H3530 Unspecified macular degeneration: Secondary | ICD-10-CM | POA: Diagnosis not present

## 2015-02-26 DIAGNOSIS — L89322 Pressure ulcer of left buttock, stage 2: Secondary | ICD-10-CM | POA: Diagnosis not present

## 2015-02-26 DIAGNOSIS — I4891 Unspecified atrial fibrillation: Secondary | ICD-10-CM | POA: Diagnosis not present

## 2015-03-04 ENCOUNTER — Ambulatory Visit (INDEPENDENT_AMBULATORY_CARE_PROVIDER_SITE_OTHER): Payer: Medicare Other | Admitting: *Deleted

## 2015-03-04 DIAGNOSIS — L89322 Pressure ulcer of left buttock, stage 2: Secondary | ICD-10-CM | POA: Diagnosis not present

## 2015-03-04 DIAGNOSIS — I4891 Unspecified atrial fibrillation: Secondary | ICD-10-CM | POA: Diagnosis not present

## 2015-03-04 DIAGNOSIS — F0280 Dementia in other diseases classified elsewhere without behavioral disturbance: Secondary | ICD-10-CM | POA: Diagnosis not present

## 2015-03-04 DIAGNOSIS — Z5181 Encounter for therapeutic drug level monitoring: Secondary | ICD-10-CM

## 2015-03-04 DIAGNOSIS — R32 Unspecified urinary incontinence: Secondary | ICD-10-CM | POA: Diagnosis not present

## 2015-03-04 DIAGNOSIS — I480 Paroxysmal atrial fibrillation: Secondary | ICD-10-CM

## 2015-03-04 DIAGNOSIS — I2510 Atherosclerotic heart disease of native coronary artery without angina pectoris: Secondary | ICD-10-CM | POA: Diagnosis not present

## 2015-03-04 DIAGNOSIS — H3530 Unspecified macular degeneration: Secondary | ICD-10-CM | POA: Diagnosis not present

## 2015-03-04 DIAGNOSIS — J449 Chronic obstructive pulmonary disease, unspecified: Secondary | ICD-10-CM | POA: Diagnosis not present

## 2015-03-04 DIAGNOSIS — I509 Heart failure, unspecified: Secondary | ICD-10-CM | POA: Diagnosis not present

## 2015-03-04 DIAGNOSIS — Z7901 Long term (current) use of anticoagulants: Secondary | ICD-10-CM | POA: Diagnosis not present

## 2015-03-04 DIAGNOSIS — Z8673 Personal history of transient ischemic attack (TIA), and cerebral infarction without residual deficits: Secondary | ICD-10-CM | POA: Diagnosis not present

## 2015-03-04 DIAGNOSIS — G309 Alzheimer's disease, unspecified: Secondary | ICD-10-CM | POA: Diagnosis not present

## 2015-03-04 LAB — POCT INR: INR: 1.7

## 2015-03-05 DIAGNOSIS — I482 Chronic atrial fibrillation: Secondary | ICD-10-CM | POA: Diagnosis not present

## 2015-03-05 DIAGNOSIS — J449 Chronic obstructive pulmonary disease, unspecified: Secondary | ICD-10-CM | POA: Diagnosis not present

## 2015-03-05 DIAGNOSIS — R0902 Hypoxemia: Secondary | ICD-10-CM | POA: Diagnosis not present

## 2015-03-11 ENCOUNTER — Ambulatory Visit (INDEPENDENT_AMBULATORY_CARE_PROVIDER_SITE_OTHER): Payer: Self-pay | Admitting: *Deleted

## 2015-03-11 DIAGNOSIS — I4891 Unspecified atrial fibrillation: Secondary | ICD-10-CM | POA: Diagnosis not present

## 2015-03-11 DIAGNOSIS — R32 Unspecified urinary incontinence: Secondary | ICD-10-CM | POA: Diagnosis not present

## 2015-03-11 DIAGNOSIS — Z5181 Encounter for therapeutic drug level monitoring: Secondary | ICD-10-CM

## 2015-03-11 DIAGNOSIS — J449 Chronic obstructive pulmonary disease, unspecified: Secondary | ICD-10-CM | POA: Diagnosis not present

## 2015-03-11 DIAGNOSIS — I509 Heart failure, unspecified: Secondary | ICD-10-CM | POA: Diagnosis not present

## 2015-03-11 DIAGNOSIS — L89322 Pressure ulcer of left buttock, stage 2: Secondary | ICD-10-CM | POA: Diagnosis not present

## 2015-03-11 DIAGNOSIS — H3530 Unspecified macular degeneration: Secondary | ICD-10-CM | POA: Diagnosis not present

## 2015-03-11 DIAGNOSIS — I2510 Atherosclerotic heart disease of native coronary artery without angina pectoris: Secondary | ICD-10-CM | POA: Diagnosis not present

## 2015-03-11 DIAGNOSIS — Z8673 Personal history of transient ischemic attack (TIA), and cerebral infarction without residual deficits: Secondary | ICD-10-CM | POA: Diagnosis not present

## 2015-03-11 DIAGNOSIS — F0280 Dementia in other diseases classified elsewhere without behavioral disturbance: Secondary | ICD-10-CM | POA: Diagnosis not present

## 2015-03-11 DIAGNOSIS — G309 Alzheimer's disease, unspecified: Secondary | ICD-10-CM | POA: Diagnosis not present

## 2015-03-11 DIAGNOSIS — I480 Paroxysmal atrial fibrillation: Secondary | ICD-10-CM

## 2015-03-11 LAB — POCT INR: INR: 2.3

## 2015-03-23 DIAGNOSIS — R32 Unspecified urinary incontinence: Secondary | ICD-10-CM | POA: Diagnosis not present

## 2015-03-25 ENCOUNTER — Ambulatory Visit (INDEPENDENT_AMBULATORY_CARE_PROVIDER_SITE_OTHER): Payer: Medicare Other | Admitting: *Deleted

## 2015-03-25 DIAGNOSIS — Z5181 Encounter for therapeutic drug level monitoring: Secondary | ICD-10-CM

## 2015-03-25 DIAGNOSIS — I480 Paroxysmal atrial fibrillation: Secondary | ICD-10-CM

## 2015-03-25 DIAGNOSIS — I4891 Unspecified atrial fibrillation: Secondary | ICD-10-CM

## 2015-03-25 DIAGNOSIS — Z7901 Long term (current) use of anticoagulants: Secondary | ICD-10-CM | POA: Diagnosis not present

## 2015-03-25 LAB — POCT INR: INR: 2.1

## 2015-03-30 DIAGNOSIS — G309 Alzheimer's disease, unspecified: Secondary | ICD-10-CM | POA: Diagnosis not present

## 2015-04-03 DIAGNOSIS — F0280 Dementia in other diseases classified elsewhere without behavioral disturbance: Secondary | ICD-10-CM | POA: Diagnosis not present

## 2015-04-03 DIAGNOSIS — I4891 Unspecified atrial fibrillation: Secondary | ICD-10-CM | POA: Diagnosis not present

## 2015-04-03 DIAGNOSIS — G309 Alzheimer's disease, unspecified: Secondary | ICD-10-CM | POA: Diagnosis not present

## 2015-04-03 DIAGNOSIS — I509 Heart failure, unspecified: Secondary | ICD-10-CM | POA: Diagnosis not present

## 2015-04-04 DIAGNOSIS — R0902 Hypoxemia: Secondary | ICD-10-CM | POA: Diagnosis not present

## 2015-04-04 DIAGNOSIS — J449 Chronic obstructive pulmonary disease, unspecified: Secondary | ICD-10-CM | POA: Diagnosis not present

## 2015-04-04 DIAGNOSIS — I482 Chronic atrial fibrillation: Secondary | ICD-10-CM | POA: Diagnosis not present

## 2015-04-13 ENCOUNTER — Other Ambulatory Visit: Payer: Self-pay | Admitting: *Deleted

## 2015-04-13 MED ORDER — WARFARIN SODIUM 2.5 MG PO TABS: ORAL_TABLET | ORAL | Status: DC

## 2015-04-22 ENCOUNTER — Ambulatory Visit (INDEPENDENT_AMBULATORY_CARE_PROVIDER_SITE_OTHER): Payer: Medicare Other | Admitting: *Deleted

## 2015-04-22 DIAGNOSIS — Z5181 Encounter for therapeutic drug level monitoring: Secondary | ICD-10-CM | POA: Diagnosis not present

## 2015-04-22 DIAGNOSIS — Z7901 Long term (current) use of anticoagulants: Secondary | ICD-10-CM

## 2015-04-22 DIAGNOSIS — R32 Unspecified urinary incontinence: Secondary | ICD-10-CM | POA: Diagnosis not present

## 2015-04-22 DIAGNOSIS — I480 Paroxysmal atrial fibrillation: Secondary | ICD-10-CM

## 2015-04-22 DIAGNOSIS — I4891 Unspecified atrial fibrillation: Secondary | ICD-10-CM | POA: Diagnosis not present

## 2015-04-22 LAB — POCT INR: INR: 4.3

## 2015-05-04 DIAGNOSIS — F0390 Unspecified dementia without behavioral disturbance: Secondary | ICD-10-CM | POA: Diagnosis not present

## 2015-05-04 DIAGNOSIS — Z0001 Encounter for general adult medical examination with abnormal findings: Secondary | ICD-10-CM | POA: Diagnosis not present

## 2015-05-04 DIAGNOSIS — I4891 Unspecified atrial fibrillation: Secondary | ICD-10-CM | POA: Diagnosis not present

## 2015-05-04 DIAGNOSIS — M40209 Unspecified kyphosis, site unspecified: Secondary | ICD-10-CM | POA: Diagnosis not present

## 2015-05-04 DIAGNOSIS — Z681 Body mass index (BMI) 19 or less, adult: Secondary | ICD-10-CM | POA: Diagnosis not present

## 2015-05-04 DIAGNOSIS — Z1389 Encounter for screening for other disorder: Secondary | ICD-10-CM | POA: Diagnosis not present

## 2015-05-05 DIAGNOSIS — I482 Chronic atrial fibrillation: Secondary | ICD-10-CM | POA: Diagnosis not present

## 2015-05-05 DIAGNOSIS — R0902 Hypoxemia: Secondary | ICD-10-CM | POA: Diagnosis not present

## 2015-05-05 DIAGNOSIS — J449 Chronic obstructive pulmonary disease, unspecified: Secondary | ICD-10-CM | POA: Diagnosis not present

## 2015-05-06 ENCOUNTER — Ambulatory Visit (INDEPENDENT_AMBULATORY_CARE_PROVIDER_SITE_OTHER): Payer: Medicare Other | Admitting: *Deleted

## 2015-05-06 DIAGNOSIS — I480 Paroxysmal atrial fibrillation: Secondary | ICD-10-CM | POA: Diagnosis not present

## 2015-05-06 DIAGNOSIS — I4891 Unspecified atrial fibrillation: Secondary | ICD-10-CM | POA: Diagnosis not present

## 2015-05-06 DIAGNOSIS — Z7901 Long term (current) use of anticoagulants: Secondary | ICD-10-CM | POA: Diagnosis not present

## 2015-05-06 DIAGNOSIS — Z5181 Encounter for therapeutic drug level monitoring: Secondary | ICD-10-CM | POA: Diagnosis not present

## 2015-05-06 LAB — POCT INR: INR: 3

## 2015-05-11 ENCOUNTER — Inpatient Hospital Stay (HOSPITAL_COMMUNITY)
Admission: EM | Admit: 2015-05-11 | Discharge: 2015-05-18 | DRG: 193 | Disposition: A | Discharge status: Hospice | Payer: Medicare Other | Attending: Internal Medicine | Admitting: Internal Medicine

## 2015-05-11 ENCOUNTER — Emergency Department (HOSPITAL_COMMUNITY): Payer: Medicare Other

## 2015-05-11 ENCOUNTER — Encounter (HOSPITAL_COMMUNITY): Payer: Self-pay | Admitting: Emergency Medicine

## 2015-05-11 DIAGNOSIS — I10 Essential (primary) hypertension: Secondary | ICD-10-CM | POA: Diagnosis not present

## 2015-05-11 DIAGNOSIS — S299XXA Unspecified injury of thorax, initial encounter: Secondary | ICD-10-CM | POA: Diagnosis not present

## 2015-05-11 DIAGNOSIS — Z7901 Long term (current) use of anticoagulants: Secondary | ICD-10-CM

## 2015-05-11 DIAGNOSIS — I739 Peripheral vascular disease, unspecified: Secondary | ICD-10-CM | POA: Diagnosis not present

## 2015-05-11 DIAGNOSIS — I5033 Acute on chronic diastolic (congestive) heart failure: Secondary | ICD-10-CM | POA: Diagnosis present

## 2015-05-11 DIAGNOSIS — Y92099 Unspecified place in other non-institutional residence as the place of occurrence of the external cause: Secondary | ICD-10-CM

## 2015-05-11 DIAGNOSIS — W19XXXA Unspecified fall, initial encounter: Secondary | ICD-10-CM | POA: Diagnosis present

## 2015-05-11 DIAGNOSIS — J189 Pneumonia, unspecified organism: Principal | ICD-10-CM | POA: Diagnosis present

## 2015-05-11 DIAGNOSIS — E870 Hyperosmolality and hypernatremia: Secondary | ICD-10-CM | POA: Diagnosis not present

## 2015-05-11 DIAGNOSIS — R1310 Dysphagia, unspecified: Secondary | ICD-10-CM | POA: Diagnosis present

## 2015-05-11 DIAGNOSIS — J9621 Acute and chronic respiratory failure with hypoxia: Secondary | ICD-10-CM | POA: Diagnosis not present

## 2015-05-11 DIAGNOSIS — K8020 Calculus of gallbladder without cholecystitis without obstruction: Secondary | ICD-10-CM | POA: Diagnosis not present

## 2015-05-11 DIAGNOSIS — F0390 Unspecified dementia without behavioral disturbance: Secondary | ICD-10-CM | POA: Diagnosis present

## 2015-05-11 DIAGNOSIS — Z8249 Family history of ischemic heart disease and other diseases of the circulatory system: Secondary | ICD-10-CM

## 2015-05-11 DIAGNOSIS — M549 Dorsalgia, unspecified: Secondary | ICD-10-CM

## 2015-05-11 DIAGNOSIS — I252 Old myocardial infarction: Secondary | ICD-10-CM | POA: Diagnosis not present

## 2015-05-11 DIAGNOSIS — H9190 Unspecified hearing loss, unspecified ear: Secondary | ICD-10-CM | POA: Diagnosis present

## 2015-05-11 DIAGNOSIS — R40242 Glasgow coma scale score 9-12: Secondary | ICD-10-CM | POA: Diagnosis not present

## 2015-05-11 DIAGNOSIS — G309 Alzheimer's disease, unspecified: Secondary | ICD-10-CM | POA: Diagnosis not present

## 2015-05-11 DIAGNOSIS — Z87891 Personal history of nicotine dependence: Secondary | ICD-10-CM | POA: Diagnosis not present

## 2015-05-11 DIAGNOSIS — I4891 Unspecified atrial fibrillation: Secondary | ICD-10-CM | POA: Diagnosis present

## 2015-05-11 DIAGNOSIS — E876 Hypokalemia: Secondary | ICD-10-CM | POA: Diagnosis not present

## 2015-05-11 DIAGNOSIS — E785 Hyperlipidemia, unspecified: Secondary | ICD-10-CM | POA: Diagnosis not present

## 2015-05-11 DIAGNOSIS — Z66 Do not resuscitate: Secondary | ICD-10-CM | POA: Diagnosis present

## 2015-05-11 DIAGNOSIS — J9620 Acute and chronic respiratory failure, unspecified whether with hypoxia or hypercapnia: Secondary | ICD-10-CM | POA: Diagnosis present

## 2015-05-11 DIAGNOSIS — R627 Adult failure to thrive: Secondary | ICD-10-CM | POA: Diagnosis not present

## 2015-05-11 DIAGNOSIS — K219 Gastro-esophageal reflux disease without esophagitis: Secondary | ICD-10-CM | POA: Diagnosis not present

## 2015-05-11 DIAGNOSIS — R531 Weakness: Secondary | ICD-10-CM | POA: Diagnosis not present

## 2015-05-11 DIAGNOSIS — Z8673 Personal history of transient ischemic attack (TIA), and cerebral infarction without residual deficits: Secondary | ICD-10-CM

## 2015-05-11 DIAGNOSIS — F0280 Dementia in other diseases classified elsewhere without behavioral disturbance: Secondary | ICD-10-CM | POA: Diagnosis not present

## 2015-05-11 DIAGNOSIS — J9622 Acute and chronic respiratory failure with hypercapnia: Secondary | ICD-10-CM | POA: Diagnosis not present

## 2015-05-11 DIAGNOSIS — Z515 Encounter for palliative care: Secondary | ICD-10-CM | POA: Diagnosis not present

## 2015-05-11 DIAGNOSIS — S81801A Unspecified open wound, right lower leg, initial encounter: Secondary | ICD-10-CM | POA: Diagnosis present

## 2015-05-11 DIAGNOSIS — J449 Chronic obstructive pulmonary disease, unspecified: Secondary | ICD-10-CM | POA: Diagnosis present

## 2015-05-11 LAB — URINALYSIS, DIPSTICK ONLY
Bilirubin Urine: NEGATIVE
Glucose, UA: NEGATIVE mg/dL
Hgb urine dipstick: NEGATIVE
Leukocytes, UA: NEGATIVE
Nitrite: NEGATIVE
SPECIFIC GRAVITY, URINE: 1.025 (ref 1.005–1.030)
UROBILINOGEN UA: 0.2 mg/dL (ref 0.0–1.0)
pH: 6 (ref 5.0–8.0)

## 2015-05-11 LAB — COMPREHENSIVE METABOLIC PANEL
ALK PHOS: 77 U/L (ref 38–126)
ALT: 12 U/L — AB (ref 14–54)
ANION GAP: 8 (ref 5–15)
AST: 18 U/L (ref 15–41)
Albumin: 3.4 g/dL — ABNORMAL LOW (ref 3.5–5.0)
BUN: 11 mg/dL (ref 6–20)
CALCIUM: 8.9 mg/dL (ref 8.9–10.3)
CO2: 35 mmol/L — AB (ref 22–32)
Chloride: 101 mmol/L (ref 101–111)
Creatinine, Ser: 0.97 mg/dL (ref 0.44–1.00)
GFR calc Af Amer: 57 mL/min — ABNORMAL LOW (ref >60)
GFR calc non Af Amer: 49 mL/min — ABNORMAL LOW (ref >60)
GLUCOSE: 104 mg/dL — AB (ref 65–99)
Potassium: 2.2 mmol/L — CL (ref 3.5–5.1)
Sodium: 144 mmol/L (ref 135–145)
Total Bilirubin: 0.9 mg/dL (ref 0.3–1.2)
Total Protein: 6.2 g/dL — ABNORMAL LOW (ref 6.5–8.1)

## 2015-05-11 LAB — I-STAT CG4 LACTIC ACID, ED
Lactic Acid, Venous: 1.01 mmol/L (ref 0.5–2.0)
Lactic Acid, Venous: 1.02 mmol/L (ref 0.5–2.0)

## 2015-05-11 LAB — CBC
HEMATOCRIT: 43.7 % (ref 36.0–46.0)
Hemoglobin: 13.8 g/dL (ref 12.0–15.0)
MCH: 28.9 pg (ref 26.0–34.0)
MCHC: 31.6 g/dL (ref 30.0–36.0)
MCV: 91.4 fL (ref 78.0–100.0)
Platelets: 230 10*3/uL (ref 150–400)
RBC: 4.78 MIL/uL (ref 3.87–5.11)
RDW: 13.8 % (ref 11.5–15.5)
WBC: 11.2 10*3/uL — ABNORMAL HIGH (ref 4.0–10.5)

## 2015-05-11 LAB — PROTIME-INR
INR: 3.5 — ABNORMAL HIGH (ref 0.00–1.49)
PROTHROMBIN TIME: 34.4 s — AB (ref 11.6–15.2)

## 2015-05-11 LAB — BRAIN NATRIURETIC PEPTIDE: B NATRIURETIC PEPTIDE 5: 399 pg/mL — AB (ref 0.0–100.0)

## 2015-05-11 LAB — TROPONIN I: TROPONIN I: 0.04 ng/mL — AB (ref <0.031)

## 2015-05-11 MED ORDER — POTASSIUM CHLORIDE 10 MEQ/100ML IV SOLN
10.0000 meq | INTRAVENOUS | Status: AC
  Administered 2015-05-11 (×4): 10 meq via INTRAVENOUS
  Filled 2015-05-11: qty 100

## 2015-05-11 MED ORDER — PIPERACILLIN-TAZOBACTAM 3.375 G IVPB 30 MIN
3.3750 g | Freq: Once | INTRAVENOUS | Status: AC
  Administered 2015-05-11: 3.375 g via INTRAVENOUS
  Filled 2015-05-11: qty 50

## 2015-05-11 MED ORDER — LORAZEPAM 0.5 MG PO TABS
0.5000 mg | ORAL_TABLET | Freq: Three times a day (TID) | ORAL | Status: DC | PRN
  Administered 2015-05-11: 0.5 mg via ORAL
  Filled 2015-05-11: qty 1

## 2015-05-11 MED ORDER — ALBUTEROL SULFATE (2.5 MG/3ML) 0.083% IN NEBU: 2.5000 mg | INHALATION_SOLUTION | RESPIRATORY_TRACT | Status: DC | PRN

## 2015-05-11 MED ORDER — SODIUM CHLORIDE 0.9 % IJ SOLN
3.0000 mL | Freq: Two times a day (BID) | INTRAMUSCULAR | Status: DC
  Administered 2015-05-15 (×2): 3 mL via INTRAVENOUS

## 2015-05-11 MED ORDER — FUROSEMIDE 10 MG/ML IJ SOLN
20.0000 mg | Freq: Once | INTRAMUSCULAR | Status: AC
  Administered 2015-05-11: 20 mg via INTRAVENOUS
  Filled 2015-05-11: qty 2

## 2015-05-11 MED ORDER — PIPERACILLIN-TAZOBACTAM 3.375 G IVPB
3.3750 g | Freq: Three times a day (TID) | INTRAVENOUS | Status: DC
  Administered 2015-05-11 – 2015-05-15 (×11): 3.375 g via INTRAVENOUS
  Filled 2015-05-11 (×21): qty 50

## 2015-05-11 MED ORDER — DONEPEZIL HCL 5 MG PO TABS
5.0000 mg | ORAL_TABLET | Freq: Every day | ORAL | Status: DC
  Administered 2015-05-11 – 2015-05-14 (×4): 5 mg via ORAL
  Filled 2015-05-11 (×4): qty 1

## 2015-05-11 MED ORDER — ONDANSETRON HCL 4 MG PO TABS: 4.0000 mg | ORAL_TABLET | Freq: Four times a day (QID) | ORAL | Status: DC | PRN

## 2015-05-11 MED ORDER — ACETAMINOPHEN 650 MG RE SUPP: 650.0000 mg | Freq: Four times a day (QID) | RECTAL | Status: DC | PRN

## 2015-05-11 MED ORDER — SODIUM CHLORIDE 0.9 % IJ SOLN: 3.0000 mL | INTRAMUSCULAR | Status: DC | PRN

## 2015-05-11 MED ORDER — VANCOMYCIN HCL IN DEXTROSE 750-5 MG/150ML-% IV SOLN
750.0000 mg | INTRAVENOUS | Status: DC
  Administered 2015-05-12 – 2015-05-15 (×4): 750 mg via INTRAVENOUS
  Filled 2015-05-11 (×7): qty 150

## 2015-05-11 MED ORDER — VANCOMYCIN HCL IN DEXTROSE 1-5 GM/200ML-% IV SOLN
1000.0000 mg | Freq: Once | INTRAVENOUS | Status: AC
  Administered 2015-05-11: 1000 mg via INTRAVENOUS
  Filled 2015-05-11: qty 200

## 2015-05-11 MED ORDER — SODIUM CHLORIDE 0.9 % IV SOLN: 250.0000 mL | INTRAVENOUS | Status: DC | PRN

## 2015-05-11 MED ORDER — RISPERIDONE 0.5 MG PO TABS
0.5000 mg | ORAL_TABLET | Freq: Every day | ORAL | Status: DC
  Administered 2015-05-11: 0.5 mg via ORAL
  Filled 2015-05-11: qty 1

## 2015-05-11 MED ORDER — ENOXAPARIN SODIUM 40 MG/0.4ML ~~LOC~~ SOLN: 40.0000 mg | SUBCUTANEOUS | Status: DC

## 2015-05-11 MED ORDER — ONDANSETRON HCL 4 MG/2ML IJ SOLN: 4.0000 mg | Freq: Four times a day (QID) | INTRAMUSCULAR | Status: DC | PRN

## 2015-05-11 MED ORDER — SODIUM CHLORIDE 0.9 % IV BOLUS (SEPSIS)
1000.0000 mL | Freq: Once | INTRAVENOUS | Status: AC
  Administered 2015-05-11: 1000 mL via INTRAVENOUS

## 2015-05-11 MED ORDER — POTASSIUM CHLORIDE 10 MEQ/100ML IV SOLN
10.0000 meq | Freq: Once | INTRAVENOUS | Status: AC
Start: 2015-05-11 — End: 2015-05-11
  Administered 2015-05-11: 10 meq via INTRAVENOUS
  Filled 2015-05-11: qty 100

## 2015-05-11 MED ORDER — ACETAMINOPHEN 325 MG PO TABS
650.0000 mg | ORAL_TABLET | Freq: Four times a day (QID) | ORAL | Status: DC | PRN
  Administered 2015-05-13: 650 mg via ORAL
  Filled 2015-05-11: qty 2

## 2015-05-11 MED ORDER — CETYLPYRIDINIUM CHLORIDE 0.05 % MT LIQD: 7.0000 mL | Freq: Two times a day (BID) | OROMUCOSAL | Status: DC

## 2015-05-11 NOTE — ED Notes (Signed)
CRITICAL VALUE ALERT  Critical value received:  K+ 2.2  Date of notification:  05/11/15  Time of notification:  1250  Critical value read back:Yes.    Nurse who received alert:  Georgette Shell RN  MD notified (1st page): McKuen MD  Time of first page:  1250  MD notified (2nd page):  Time of second page:  Responding MD:    Time MD responded:

## 2015-05-11 NOTE — H&P (Signed)
History and Physical  Angela [name]:[id number] DOB: 02-10-1921 DOA: 05/11/2015  Referring physician: Dr. Vinnie Level PCP: Cassell Smiles., MD   Chief Complaint: Fall  HPI:  81 year old woman PMH dementia, multiple comorbidities presented to the emergency department with her son with increasing generalized weakness status post a fall in the last 24 hours, reportedly febrile. Initial evaluation afebrile, tachypneic, chest x-ray suggested pneumonia. Lactic acid was normal.  Patient has advanced dementia. All history is obtained from her son at bedside. Son Ivar Drape is POA. They live together and he provides most of her care in conjunction with a CAP aide. He reports that her dementia is progressing, she has failure to thrive with poor oral intake, she does not voice take her medications and she eats little if anything. At times she does not recognize her son and she requires assistance with all daily activities. She usually uses a walker to walk. 8/7 he was assisting her to the bathroom when he turned his head for a moment and the patient fell straight backwards. No apparent loss of consciousness. He was able to get her back up and put her in bed. Today she complained of nonspecific pain and was not mobile. Generally weak. She does not normally complain of anything. She has not been sick as far as he knows. She is not had fever or systemic symptoms.  In the emergency department afebrile, vital signs stable. Initially tachypneic. No significant hypoxia. Pertinent labs: Potassium 2.2. CMP otherwise unremarkable.lactic acid normal. WBC 11.2. Remainder CBC unremarkable. INR 3.5. Urinalysis unremarkable. EKG: Independently reviewed. AFib, RBBB, LPFB, compared to previous study 02/15/2015, RBBB and LPFB old. Imaging: independently reviewed. Chest x-ray. Bilateral consolidation, consider active atelectasis versus infiltrate. Pulmonary vascular congestion.  Review of Systems:  According to the son and she does  not complain of anything. The patient is awake and does answer simple questions but her history is unreliable. As far as can be ascertained her review systems is negative.  Negative for fever, visual changes, sore throat, rash, new muscle aches, chest pain, SOB, dysuria, bleeding, n/v/abdominal pain.  Past Medical History  Diagnosis Date  . ASCVD (arteriosclerotic cardiovascular disease)     acute MI in 1993;40%left main,80%LAD, 100%RCA-PTCA; EF of 40%;stress nuclear in2007; normal EF;  mixed inferolateral ischemia and infarction; CHF with normal EF in 2011  . CHF (congestive heart failure)     normal EF;mixed inferolateral ischemic and infarction;chf with normal EF in 2011  . Hypertension   . Hyperlipidemia   . Cerebrovascular disease     CVA by CT in 2007,but not noted in 2009;duplex in 12/2006 plaque without stenosis; Emergency Room evaluation in 12/2010 for transient aphasia and paresis-clopidogrel initially added to ASA Rx, but subsequently warfarin was substituted for these 2 agents  . Tobacco abuse, in remission     quit in 1995  . Claudication   . Dementia     mild  . Kyphosis     severe  . Decreased hearing   . Atrial fibrillation   . Chronic anticoagulation   . TIA (transient ischemic attack)   . Renal disorder   . Glass eye     right-1990  . HOH (hard of hearing)   . Wears glasses   . GERD (gastroesophageal reflux disease)   . Alzheimer's dementia   . COPD (chronic obstructive pulmonary disease)     Past Surgical History  Procedure Laterality Date  . Abdominal hysterectomy    . Appendectomy    . Tonsillectomy    .  Enucleation      Right-resulted from infection  . Hardware removal Left 08/16/2013    Procedure: REMOVAL 2 K WIRES FROM LEFT ANKLE. ;  Surgeon: Harvie Junior, MD;  [facility]:  [facility];  Service: Orthopedics;  Laterality: Left;  REMOVAL 2 K WIRES FROM LEFT ANKLE.      Social History:  reports that she quit smoking about 21 years ago. She  started smoking about 60 years ago. She has never used smokeless tobacco. She reports that she does not drink alcohol or use illicit drugs. lives with their son Total assistance  Allergies  Allergen Reactions  . Haldol [Haloperidol Decanoate]     Over sedation  . Sulfa Antibiotics Other (See Comments)    uknown    Family History  Problem Relation Age of Onset  . Dementia Mother   . Atrial fibrillation Son   . Hypertension Son      Prior to Admission medications   Medication Sig Start Date End Date Taking? Authorizing Provider  albuterol (PROVENTIL) (2.5 MG/3ML) 0.083% nebulizer solution Take 2.5 mg by nebulization every 4 (four) hours as needed. For shortness of breath/cough 08/09/12   Historical Provider, MD  alendronate (FOSAMAX) 70 MG tablet Take 70 mg by mouth every 7 (seven) days. Take with a full glass of water on an empty stomach.     Historical Provider, MD  cetirizine (ZYRTEC) 10 MG tablet Take 10 mg by mouth at bedtime.     Historical Provider, MD  Cyanocobalamin (VITAMIN B-12) 5000 MCG SUBL Place 5,000 mcg under the tongue daily.    Historical Provider, MD  donepezil (ARICEPT) 5 MG tablet Take 5 mg by mouth at bedtime.    Historical Provider, MD  feeding supplement, ENSURE ENLIVE, (ENSURE ENLIVE) LIQD Take 237 mLs by mouth 2 (two) times daily between meals. 02/18/15   Gwenyth Bender, NP  furosemide (LASIX) 40 MG tablet Take 0.5 tablets (20 mg total) by mouth as needed for fluid or edema. 02/18/15   Gwenyth Bender, NP  lisinopril (PRINIVIL,ZESTRIL) 2.5 MG tablet Take 2.5 mg by mouth every morning.     Historical Provider, MD  LORazepam (ATIVAN) 0.5 MG tablet Take 1 tablet (0.5 mg total) by mouth every 12 (twelve) hours as needed for anxiety. Anxiety and or agitation. *Last dose is not to be given, if patient receives sleeping medication HALCION (TRIAZOLAM)* 02/18/15   Gwenyth Bender, NP  memantine (NAMENDA) 10 MG tablet Take 10 mg by mouth 2 (two) times daily.     Historical  Provider, MD  Multiple Vitamins-Minerals (MULTIVITAMIN WITH MINERALS) tablet Take 1 tablet by mouth every morning.     Historical Provider, MD  nitroGLYCERIN (NITROSTAT) 0.4 MG SL tablet Place 0.4 mg under the tongue every 5 (five) minutes as needed. For chest pain  05/25/11   Jodelle Gross, NP  omeprazole (PRILOSEC) 40 MG capsule Take 40 mg by mouth daily.    Historical Provider, MD  potassium chloride SA (K-DUR,KLOR-CON) 20 MEQ tablet Take 1 tablet (20 mEq total) by mouth daily. 1 tablet on days that lasix is taken 02/18/15   Gwenyth Bender, NP  risperiDONE (RISPERDAL) 0.5 MG tablet Take 0.5 mg by mouth at bedtime.    Historical Provider, MD  Salicylic Acid (SALEX) 6 % SHAM Apply 1 application topically once a week. For psoriasis    Historical Provider, MD  warfarin (COUMADIN) 2.5 MG tablet Take 1 tablet daily except 1/2 tablet on Wednesdays 04/13/15   Darliss Ridgel  Doran Stabler, MD   Physical Exam: Filed Vitals:   05/11/15 1130 05/11/15 1143 05/11/15 1255  BP: 131/97  157/69  Pulse: 94  94  Temp: 99.2 F (37.3 C)    TempSrc: Rectal    Resp: 32  19  Height: 5\' 4"  (1.626 m)    Weight: 53.978 kg (119 lb)    SpO2: 86% 93% 99%    General: She appears acutely and chronically ill but not toxic. Eyes: The left eye appears grossly unremarkable as does lids. There is a foci on the right with this is crusted over. ENT: Hard of hearing. Lips are very dry there is mucous stranding. Neck: no LAD, masses or thyromegaly Cardiovascular: RRR, no m/r/g. No LE edema. Respiratory: CTA bilaterally, no w/r/r. Increased respiratory effort. Abdomen: soft, ntnd, skin appears unremarkable Skin: Multiple areas of chronic skin changes and some bruising lower extremities. Musculoskeletal: grossly normal tone BUE/BLE although she does not follow commands. Psychiatric: She does answer simple questions although she does not follow commands. Very difficult to assess. appropriate Neurologic: grossly non-focal but  limited.  Wt Readings from Last 3 Encounters:  05/11/15 53.978 kg (119 lb)  02/16/15 54 kg (119 lb 0.8 oz)  06/21/14 54.432 kg (120 lb)    Labs on Admission:  Basic Metabolic Panel:  Recent Labs Lab 05/11/15 1155  NA 144  K 2.2*  CL 101  CO2 35*  GLUCOSE 104*  BUN 11  CREATININE 0.97  CALCIUM 8.9    Liver Function Tests:  Recent Labs Lab 05/11/15 1155  AST 18  ALT 12*  ALKPHOS 77  BILITOT 0.9  PROT 6.2*  ALBUMIN 3.4*   CBC:  Recent Labs Lab 05/11/15 1155  WBC 11.2*  HGB 13.8  HCT 43.7  MCV 91.4  PLT 230   Radiological Exams on Admission: Ct Head Wo Contrast  05/11/2015   CLINICAL DATA:  81 year old female with lethargy. Subsequent encounter.  EXAM: CT HEAD WITHOUT CONTRAST  TECHNIQUE: Contiguous axial images were obtained from the base of the skull through the vertex without intravenous contrast.  COMPARISON:  02/15/2015.  FINDINGS: Exam is motion degraded.  No intracranial hemorrhage.  Remote left medial temporal lobe infarct.  Prominent small vessel disease type changes without CT evidence of large acute infarct.  Global atrophy without hydrocephalus.  Prominent falx and tentorial calcifications.  Vascular calcifications.  Post right orbital exoneration.  No calvarial abnormality. Mastoid air cells, middle ear cavities and visualized paranasal sinuses are clear.  IMPRESSION: Exam is motion degraded.  No intracranial hemorrhage.  Remote left medial temporal lobe infarct.  Prominent small vessel disease type changes without CT evidence of large acute infarct.  Global atrophy without hydrocephalus.   Electronically Signed   By: Lacy Duverney M.D.   On: 05/11/2015 13:16   Dg Chest Portable 1 View  05/11/2015   CLINICAL DATA:  81 year old female post fall. Lethargic. Initial encounter.  EXAM: PORTABLE CHEST - 1 VIEW  COMPARISON:  02/15/2015.  FINDINGS: Cardiomegaly.  Pulmonary vascular congestion/ pulmonary edema with pleural effusions suspected.  Basilar consolidation  may represent pleural fluid and atelectasis although infiltrate or mass cannot be excluded.  No obvious pneumothorax or rib fracture.  Calcified tortuous aorta.  IMPRESSION: Cardiomegaly.  Pulmonary vascular congestion/ pulmonary edema with pleural effusions suspected.  Basilar consolidation may represent pleural fluid and atelectasis although infiltrate or mass cannot be excluded.   Electronically Signed   By: Lacy Duverney M.D.   On: 05/11/2015 12:20    Principal Problem:  Pneumonia Active Problems:   Dementia   Chronic anticoagulation   Atrial fibrillation   FTT (failure to thrive) in adult   Assessment/Plan 1. Suspected pneumonia with tachypnea. She is afebrile here with modest leukocytosis and her hemodynamics are stable. Lactic acid is well within normal limits. No significant hypoxia. 2. Advanced Alzheimer's dementia. 3. Failure to thrive.  4. Possible pulmonary edema. Last echocardiogram on file from May 2016 with normal EF. No comment on diastolic function. Chest x-rays a very poor film and I suspect there is artifactual crowding here. Exam and history more suggestive of pneumonia than pulmonary edema.  5. Hypokalemia 6. Atrial fibrillation on warfarin 7. COPD appears stable. Good air movement. No wheezing. Patient awakens easily.   Long discussion with son at bedside. As noted above he reports failure to thrive with overall worsening of her condition. He does not think he can take care of her at home any longer. The patient has clear history of worsening Alzheimer's and is likely at end-stage. He understands that her prognosis short-term and long-term is very poor and that life expectancy might be quite short. While she does not appear toxic, her prognosis is guarded.  Plan admission to the hospital for IV antibiotics,Lasix 1  Check BNP, check troponin 1  Replace potassium  We will consult palliative medicine to assist with goals of care. Hospice I think would be a good  choice for her.  Code Status: DNR/DNI DVT prophylaxis: Lovenox Family Communication:  Disposition Plan/Anticipated LOS: admit, 2-3 days  Time spent: 80 minutes  Brendia Sacks, MD  [facility] Pager [phone] 05/11/2015, 2:08 PM

## 2015-05-11 NOTE — ED Notes (Signed)
Per EMS- home health nurse called ems for lethargy and strong smelling urine today. Per EMS son started pt was normal yesterday other than a fall around 3pm, but acted normal afterwards.

## 2015-05-11 NOTE — Progress Notes (Signed)
ANTIBIOTIC CONSULT NOTE-Preliminary  Pharmacy Consult for Vancomycin and Zosyn Indication: bacteremia / sepsis  Allergies  Allergen Reactions  . Haldol [Haloperidol Decanoate]     Over sedation  . Sulfa Antibiotics Other (See Comments)    uknown   Patient Measurements: Height:  (162.6 cm) Weight: 119 lb (53.978 kg) IBW/kg (Calculated) : 54.7  Vital Signs: Temp: 99.2 F (37.3 C) (08/08 1130) Temp Source: Rectal (08/08 1130) BP: 157/69 mmHg (08/08 1255) Pulse Rate: 94 (08/08 1255)  Labs:  Recent Labs  05/11/15 1155  WBC 11.2*  HGB 13.8  PLT 230  CREATININE 0.97    Estimated Creatinine Clearance: 31.5 mL/min (by C-G formula based on Cr of 0.97).  No results for input(s): VANCOTROUGH, VANCOPEAK, VANCORANDOM, GENTTROUGH, GENTPEAK, GENTRANDOM, TOBRATROUGH, TOBRAPEAK, TOBRARND, AMIKACINPEAK, AMIKACINTROU, AMIKACIN in the last 72 hours.   Microbiology: No results found for this or any previous visit (from the past 720 hour(s)).  Medical History: Past Medical History  Diagnosis Date  . ASCVD (arteriosclerotic cardiovascular disease)     acute MI in 1993;40%left main,80%LAD, 100%RCA-PTCA; EF of 40%;stress nuclear in2007; normal EF;  mixed inferolateral ischemia and infarction; CHF with normal EF in 2011  . CHF (congestive heart failure)     normal EF;mixed inferolateral ischemic and infarction;chf with normal EF in 2011  . Hypertension   . Hyperlipidemia   . Cerebrovascular disease     CVA by CT in 2007,but not noted in 2009;duplex in 12/2006 plaque without stenosis; Emergency Room evaluation in 12/2010 for transient aphasia and paresis-clopidogrel initially added to ASA Rx, but subsequently warfarin was substituted for these 2 agents  . Tobacco abuse, in remission     quit in 1995  . Claudication   . Dementia     mild  . Kyphosis     severe  . Decreased hearing   . Atrial fibrillation   . Chronic anticoagulation   . TIA (transient ischemic attack)   . Renal  disorder   . Glass eye     right-1990  . HOH (hard of hearing)   . Wears glasses   . GERD (gastroesophageal reflux disease)   . Alzheimer's dementia   . COPD (chronic obstructive pulmonary disease)    Anti-infectives    Start     Dose/Rate Route Frequency Ordered Stop   05/11/15 1215  vancomycin (VANCOCIN) IVPB 1000 mg/200 mL premix     1,000 mg 200 mL/hr over 60 Minutes Intravenous  Once 05/11/15 1209     05/11/15 1215  piperacillin-tazobactam (ZOSYN) IVPB 3.375 g     3.375 g 100 mL/hr over 30 Minutes Intravenous  Once 05/11/15 1209 05/11/15 1332     Assessment: 81yo female presents to ED after [facility] called EMS for lethargy and foul smelling urine.  Pt has been started on Vancomycin and Zosyn in ED, Normalized clcr ~ 40-45.  Goal of Therapy:  Vancomycin trough level 15-20 mcg/ml Eradicate infection.  Plan:  Preliminary review of pertinent patient information completed.  Protocol will be initiated with a one-time dose(s) of Vancomycin  and Zosyn 3.375gm.   Orders for maintenance doses (after admission) have been signed and held.  Jeani Hawking clinical pharmacist will complete review during morning rounds to assess patient and finalize treatment regimen / evaluate for any changes if needed.    Valrie Hart A, RPH 05/11/2015,1:59 PM

## 2015-05-11 NOTE — ED Provider Notes (Signed)
This chart was scribed for Angela Subramaniam Randall An, MD by Murriel Hopper, ED Scribe. This patient was seen in room APA02/APA02 and the patient's care was started at 11:48 AM.  Chief Complaint  Patient presents with  . Altered Mental Status   LEVEL 5 CAVEAT    Patient is a 81 y.o. female presenting with altered mental status. The history is provided by the EMS personnel. The history is limited by the condition of the patient. No language interpreter was used.  Altered Mental Status Presenting symptoms: unresponsiveness   Associated symptoms: no abdominal pain, no hallucinations, no headaches, no rash and no seizures      HPI Comments: Angela [name] is a 81 y.o. female brought in by EMS who presents to the Emergency Department from a fall that occurred last night. EMS states that she was in a normal state of health prior to the fall. Home nursing went to her home this morning and found her to be slumped over, febrile, and not in a normal state of health.    Past Medical History  Diagnosis Date  . ASCVD (arteriosclerotic cardiovascular disease)     acute MI in 1993;40%left main,80%LAD, 100%RCA-PTCA; EF of 40%;stress nuclear in2007; normal EF;  mixed inferolateral ischemia and infarction; CHF with normal EF in 2011  . CHF (congestive heart failure)     normal EF;mixed inferolateral ischemic and infarction;chf with normal EF in 2011  . Hypertension   . Hyperlipidemia   . Cerebrovascular disease     CVA by CT in 2007,but not noted in 2009;duplex in 12/2006 plaque without stenosis; Emergency Room evaluation in 12/2010 for transient aphasia and paresis-clopidogrel initially added to ASA Rx, but subsequently warfarin was substituted for these 2 agents  . Tobacco abuse, in remission     quit in 1995  . Claudication   . Dementia     mild  . Kyphosis     severe  . Decreased hearing   . Atrial fibrillation   . Chronic anticoagulation   . TIA (transient ischemic attack)   . Renal disorder   .  Glass eye     right-1990  . HOH (hard of hearing)   . Wears glasses   . GERD (gastroesophageal reflux disease)   . Alzheimer's dementia   . COPD (chronic obstructive pulmonary disease)    Past Surgical History  Procedure Laterality Date  . Abdominal hysterectomy    . Appendectomy    . Tonsillectomy    . Enucleation      Right-resulted from infection  . Hardware removal Left 08/16/2013    Procedure: REMOVAL 2 K WIRES FROM LEFT ANKLE. ;  Surgeon: Harvie Junior, MD;  Location: [facility];  Service: Orthopedics;  Laterality: Left;  REMOVAL 2 K WIRES FROM LEFT ANKLE.     Family History  Problem Relation Age of Onset  . Dementia Mother   . Atrial fibrillation Son   . Hypertension Son    History  Substance Use Topics  . Smoking status: Former Smoker    Start date: 06/06/1954    Quit date: 01/05/1994  . Smokeless tobacco: Never Used  . Alcohol Use: No   OB History    No data available     Review of Systems  Constitutional: Negative for appetite change and fatigue.  HENT: Negative for congestion, ear discharge and sinus pressure.   Eyes: Negative for discharge.  Respiratory: Negative for cough.   Cardiovascular: Negative for chest pain.  Gastrointestinal: Negative for  abdominal pain and diarrhea.  Genitourinary: Negative for frequency and hematuria.  Musculoskeletal: Negative for back pain.  Skin: Negative for rash.  Neurological: Negative for seizures and headaches.  Psychiatric/Behavioral: Negative for hallucinations.  All other systems reviewed and are negative.     Allergies  Haldol and Sulfa antibiotics  Home Medications   Prior to Admission medications   Medication Sig Start Date End Date Taking? Authorizing Provider  albuterol (PROVENTIL) (2.5 MG/3ML) 0.083% nebulizer solution Take 2.5 mg by nebulization every 4 (four) hours as needed. For shortness of breath/cough 08/09/12   Historical Provider, MD  alendronate (FOSAMAX) 70 MG tablet Take 70  mg by mouth every 7 (seven) days. Take with a full glass of water on [name] empty stomach.     Historical Provider, MD  cetirizine (ZYRTEC) 10 MG tablet Take 10 mg by mouth at bedtime.     Historical Provider, MD  Cyanocobalamin (VITAMIN B-12) 5000 MCG SUBL Place 5,000 mcg under the tongue daily.    Historical Provider, MD  donepezil (ARICEPT) 5 MG tablet Take 5 mg by mouth at bedtime.    Historical Provider, MD  feeding supplement, ENSURE ENLIVE, (ENSURE ENLIVE) LIQD Take 237 mLs by mouth 2 (two) times daily between meals. 02/18/15   Gwenyth Bender, NP  furosemide (LASIX) 40 MG tablet Take 0.5 tablets (20 mg total) by mouth as needed for fluid or edema. 02/18/15   Gwenyth Bender, NP  lisinopril (PRINIVIL,ZESTRIL) 2.5 MG tablet Take 2.5 mg by mouth every morning.     Historical Provider, MD  LORazepam (ATIVAN) 0.5 MG tablet Take 1 tablet (0.5 mg total) by mouth every 12 (twelve) hours as needed for anxiety. Anxiety and or agitation. *Last dose is not to be given, if patient receives sleeping medication HALCION (TRIAZOLAM)* 02/18/15   Gwenyth Bender, NP  memantine (NAMENDA) 10 MG tablet Take 10 mg by mouth 2 (two) times daily.     Historical Provider, MD  Multiple Vitamins-Minerals (MULTIVITAMIN WITH MINERALS) tablet Take 1 tablet by mouth every morning.     Historical Provider, MD  nitroGLYCERIN (NITROSTAT) 0.4 MG SL tablet Place 0.4 mg under the tongue every 5 (five) minutes as needed. For chest pain  05/25/11   Jodelle Gross, NP  omeprazole (PRILOSEC) 40 MG capsule Take 40 mg by mouth daily.    Historical Provider, MD  potassium chloride SA (K-DUR,KLOR-CON) 20 MEQ tablet Take 1 tablet (20 mEq total) by mouth daily. 1 tablet on days that lasix is taken 02/18/15   Gwenyth Bender, NP  risperiDONE (RISPERDAL) 0.5 MG tablet Take 0.5 mg by mouth at bedtime.    Historical Provider, MD  Salicylic Acid (SALEX) 6 % SHAM Apply 1 application topically once a week. For psoriasis    Historical Provider, MD  warfarin  (COUMADIN) 2.5 MG tablet Take 1 tablet daily except 1/2 tablet on Wednesdays 04/13/15   Laqueta Linden, MD   BP 131/97 mmHg  Pulse 94  Temp(Src) 99.2 F (37.3 C) (Rectal)  Resp 32  Ht 5\' 4"  (1.626 m)  Wt 119 lb (53.978 kg)  BMI 20.42 kg/m2  SpO2 93% Physical Exam  Cardiovascular:  Murmur heard. Systolic murmur   Pulmonary/Chest:  Tachypnic    Skin:  2+ edema bilaterally Chronic bruising to both shins  Nursing note and vitals reviewed.   ED Course  Procedures (including critical care time)  DIAGNOSTIC STUDIES: Oxygen Saturation is 93% on room air, normal by my interpretation.    COORDINATION OF  CARE: 11:51 AM Discussed treatment plan with pt at bedside and pt agreed to plan.   Labs Review Labs Reviewed  CULTURE, BLOOD (ROUTINE X 2)  CULTURE, BLOOD (ROUTINE X 2)  URINE CULTURE  COMPREHENSIVE METABOLIC PANEL  CBC  URINALYSIS, DIPSTICK ONLY  URINALYSIS, ROUTINE W REFLEX MICROSCOPIC (NOT AT [facility])  CBG MONITORING, ED  I-STAT CG4 LACTIC ACID, ED    Imaging Review No results found.   EKG Interpretation None      MDM   Final diagnoses:  None  Patient is a 81 year old female presented today with altered mental status and fever. Patient reportedly had a fall yesterday and was normal last night. This morning she was found confused by home health aide. EMS reported there was a fever.  Patient unable to give reliable history.   On exam patient has very dry mucous membranes. Abdomen is soft. Lungs clear to auscultation bilaterally however patient is tachypnic on exam.  Concern today for definitive hemorrhage, pneumonia, urinary tract infection, sepsis. Will initiate sepsis labs. Patient is 88% on room air. We will supply additional oxygen and get chest x-ray.  1:00 PM Chest x-ray shows question of infiltrates will treat presumptive with antibiotics.   I personally performed the services described in this documentation, which was scribed in my presence. The  recorded information has been reviewed and is accurate.    Anneth Brunell Randall An, MD 05/11/15 [phone]

## 2015-05-12 DIAGNOSIS — Z7901 Long term (current) use of anticoagulants: Secondary | ICD-10-CM

## 2015-05-12 DIAGNOSIS — E876 Hypokalemia: Secondary | ICD-10-CM

## 2015-05-12 LAB — CBC
HEMATOCRIT: 39.6 % (ref 36.0–46.0)
Hemoglobin: 12.3 g/dL (ref 12.0–15.0)
MCH: 28.5 pg (ref 26.0–34.0)
MCHC: 31.1 g/dL (ref 30.0–36.0)
MCV: 91.9 fL (ref 78.0–100.0)
Platelets: 210 10*3/uL (ref 150–400)
RBC: 4.31 MIL/uL (ref 3.87–5.11)
RDW: 13.7 % (ref 11.5–15.5)
WBC: 11.1 10*3/uL — ABNORMAL HIGH (ref 4.0–10.5)

## 2015-05-12 LAB — BASIC METABOLIC PANEL
Anion gap: 9 (ref 5–15)
BUN: 11 mg/dL (ref 6–20)
CALCIUM: 8 mg/dL — AB (ref 8.9–10.3)
CO2: 32 mmol/L (ref 22–32)
CREATININE: 0.97 mg/dL (ref 0.44–1.00)
Chloride: 100 mmol/L — ABNORMAL LOW (ref 101–111)
GFR, EST AFRICAN AMERICAN: 57 mL/min — AB (ref >60)
GFR, EST NON AFRICAN AMERICAN: 49 mL/min — AB (ref >60)
Glucose, Bld: 93 mg/dL (ref 65–99)
Potassium: 2.6 mmol/L — CL (ref 3.5–5.1)
SODIUM: 141 mmol/L (ref 135–145)

## 2015-05-12 LAB — PROTIME-INR
INR: 5.39 (ref 0.00–1.49)
PROTHROMBIN TIME: 48 s — AB (ref 11.6–15.2)

## 2015-05-12 LAB — TROPONIN I: Troponin I: <0.03 ng/mL (ref <0.031)

## 2015-05-12 MED ORDER — LORAZEPAM 0.5 MG PO TABS
0.5000 mg | ORAL_TABLET | Freq: Three times a day (TID) | ORAL | Status: DC
  Administered 2015-05-12 – 2015-05-15 (×8): 0.5 mg via ORAL
  Filled 2015-05-12 (×8): qty 1

## 2015-05-12 MED ORDER — POLYVINYL ALCOHOL 1.4 % OP SOLN
1.0000 [drp] | OPHTHALMIC | Status: DC | PRN
  Filled 2015-05-12: qty 15

## 2015-05-12 MED ORDER — PANTOPRAZOLE SODIUM 40 MG PO TBEC
40.0000 mg | DELAYED_RELEASE_TABLET | Freq: Every day | ORAL | Status: DC
  Administered 2015-05-12 – 2015-05-15 (×4): 40 mg via ORAL
  Filled 2015-05-12 (×4): qty 1

## 2015-05-12 MED ORDER — POTASSIUM CHLORIDE CRYS ER 20 MEQ PO TBCR
20.0000 meq | EXTENDED_RELEASE_TABLET | Freq: Every day | ORAL | Status: DC
  Administered 2015-05-13 – 2015-05-15 (×3): 20 meq via ORAL
  Filled 2015-05-12 (×3): qty 1

## 2015-05-12 MED ORDER — LISINOPRIL 5 MG PO TABS
2.5000 mg | ORAL_TABLET | Freq: Every morning | ORAL | Status: DC
  Administered 2015-05-13 – 2015-05-15 (×3): 2.5 mg via ORAL
  Filled 2015-05-12 (×3): qty 1

## 2015-05-12 MED ORDER — POTASSIUM CHLORIDE 20 MEQ PO PACK
40.0000 meq | PACK | ORAL | Status: AC
  Administered 2015-05-12 (×2): 40 meq via ORAL
  Filled 2015-05-12 (×2): qty 2

## 2015-05-12 MED ORDER — LORATADINE 10 MG PO TABS
10.0000 mg | ORAL_TABLET | Freq: Every day | ORAL | Status: DC
  Administered 2015-05-12 – 2015-05-15 (×4): 10 mg via ORAL
  Filled 2015-05-12 (×4): qty 1

## 2015-05-12 MED ORDER — MEMANTINE HCL 10 MG PO TABS
10.0000 mg | ORAL_TABLET | Freq: Two times a day (BID) | ORAL | Status: DC
  Administered 2015-05-12 – 2015-05-15 (×6): 10 mg via ORAL
  Filled 2015-05-12 (×6): qty 1

## 2015-05-12 MED ORDER — ALBUTEROL SULFATE (2.5 MG/3ML) 0.083% IN NEBU
2.5000 mg | INHALATION_SOLUTION | Freq: Two times a day (BID) | RESPIRATORY_TRACT | Status: DC
  Administered 2015-05-12 – 2015-05-13 (×2): 2.5 mg via RESPIRATORY_TRACT
  Filled 2015-05-12 (×2): qty 3

## 2015-05-12 NOTE — Progress Notes (Signed)
PROGRESS NOTE  Angela [name] ZOX:[id number] DOB: 08-08-1921 DOA: 05/11/2015 PCP: Cassell Smiles., MD  Summary: 81 year old woman PMH dementia, multiple comorbidities presented to the emergency department with her son with increasing generalized weakness status post a fall in the last 24 hours, reportedly febrile. Initial evaluation afebrile, tachypneic, chest x-ray suggested pneumonia. Admitted for further treatment and monitoring.   Assessment/Plan: 1. Suspected pneumonia with tachypnea. Seems to be improved. Hemodynamically stable, oxygen stable 2. Advanced Alzheimer's dementia. 3. Failure to thrive.  4. Possible pulmonary edema. Last echocardiogram on file from May 2016 with normal EF. No comment on diastolic function. Chest x-rays a very poor film and I suspect there is artifactual crowding here. Exam and history more suggestive of pneumonia than pulmonary edema. BNP was only modestly elevated. 5. Hypokalemia, improved 6. Atrial fibrillation on warfarin as an outpatient. INR has increased with antibiotics. No evidence of bleeding. 7. COPD appears stable. Good air movement. No wheezing.    Overall appears chronically ill, but appears stable with stable oxygenation.  Plan to continue abx, consider changing to oral in the next 48 hours  Replace potassium aggressively  BMP and magnesium in the morning  INR in the morning, no evidence of bleeding at this time. If increases, consider vitamin K.  PMT evaluation consult will be requested again  Long discussion with son/power of attorney as well as daughter at bedside. We discussed testing, imaging, laboratory studies as well as reviewed all the patient's medications including Ativan 3 times a day which they say she takes every day.  Code Status: DNR/DNI DVT prophylaxis: Lovenox Family Communication:  Disposition Plan: SNF vs home  Brendia Sacks, MD  [facility]  Pager [phone] If 7PM-7AM, please contact night-coverage at  [email or web address], password [facility] 05/12/2015, 9:51 AM  LOS: 1 day   Consultants:  PMT  Procedures:    Antibiotics:  Vancomycin 8/9>>  Zosyn 8/9 >>  HPI/Subjective: Conversation limited due to mental status but seems to feel okay.  Objective: Filed Vitals:   05/11/15 1500 05/11/15 1524 05/11/15 1556 05/11/15 2209  BP: 136/83  130/78 98/60  Pulse: 92  72 75  Temp:  98.2 F (36.8 C) 98.1 F (36.7 C) 97.5 F (36.4 C)  TempSrc:  Axillary  Oral  Resp: Height:    (1.626 m)   Weight:   53.978 kg (119 lb)   SpO2: 98%  100% 98%   No intake or output data in the 24 hours ending 05/12/15 0951   Filed Weights   05/11/15 1130 05/11/15 1556  Weight: 53.978 kg (119 lb) 53.978 kg (119 lb)    Exam:     Afebrile, VSS, Not hypoxic 100% on RA  General: Appears chronically ill, not toxic, calm and comfortable, lying in bed  Eyes: Left eye open. Appears grossly unremarkable.  ENT: grossly normal hearing, lips & tongue  Cardiovascular: Irregular, normal rate, no m/r/g. No LE edema   Telemetry: Atrial fibrillation  Respiratory: inspiratory crackles on right, left is clear. Respiratory effort appears normal  Abdomen: soft, ntnd, well healed incision   Skin: anterior tibia bilaterally bruising with one open wound on right. No evidence of infection. Chronic skin changes bilateral LE, right greater than left.   Musculoskeletal: grossly normal tone BUE/BLE though not following commands  Psychiatric: Difficult to assess  Neurologic: grossly non-focal, limited   New data reviewed:  Potassium improved, 2.6 remainder of basic metabolic panel unremarkable  Repeat troponin negative  CBC without change, WBC 11.1  INR increased, 5.39 secondary to antibiotics  Pertinent data since admission:  Chest x-ray. Bilateral consolidation, consider active atelectasis versus infiltrate. Pulmonary vascular congestion.  Lactic acid within normal limits  Pending  data:    Scheduled Meds: . donepezil  5 mg Oral QHS  . piperacillin-tazobactam (ZOSYN)  IV  3.375 g Intravenous Q8H  . risperiDONE  0.5 mg Oral QHS  . sodium chloride  3 mL Intravenous Q12H  . vancomycin  750 mg Intravenous Q24H   Continuous Infusions:   Principal Problem:   Pneumonia Active Problems:   Dementia   Chronic anticoagulation   Atrial fibrillation   FTT (failure to thrive) in adult   Hypokalemia   Time spent 35 minutes, greater than 50% in counseling  I, Arielle Khosrowpour, acting a scribe, recorded this note contemporaneously in the presence of Dr. Melton Alar. Irene Limbo, M.D. on 05/12/2015   I have reviewed the above documentation for accuracy and completeness, and I agree with the above. Brendia Sacks, MD

## 2015-05-12 NOTE — Progress Notes (Signed)
Critical lab values (K+ and INR) relayed to MD.  Orders placed to replete K+ and plan to draw INR in the AM.

## 2015-05-12 NOTE — Progress Notes (Signed)
ANTICOAGULATION CONSULT NOTE - Initial Consult  Pharmacy Consult for coumadin Indication: atrial fibrillation  Allergies  Allergen Reactions  . Haldol [Haloperidol Decanoate]     Over sedation  . Sulfa Antibiotics Other (See Comments)    uknown    Patient Measurements: Height: 5\' 4"  (162.6 cm) Weight: 119 lb (53.978 kg) IBW/kg (Calculated) : 54.7   Vital Signs:    Labs:  Recent Labs  05/11/15 1155 05/11/15 1210  HGB 13.8  --   HCT 43.7  --   PLT 230  --   LABPROT  --  34.4*  INR  --  3.50*  CREATININE 0.97  --   TROPONINI 0.04*  --     Estimated Creatinine Clearance: 31.5 mL/min (by C-G formula based on Cr of 0.97).   Medical History: Past Medical History  Diagnosis Date  . ASCVD (arteriosclerotic cardiovascular disease)     acute MI in 1993;40%left main,80%LAD, 100%RCA-PTCA; EF of 40%;stress nuclear in2007; normal EF;  mixed inferolateral ischemia and infarction; CHF with normal EF in 2011  . CHF (congestive heart failure)     normal EF;mixed inferolateral ischemic and infarction;chf with normal EF in 2011  . Hypertension   . Hyperlipidemia   . Cerebrovascular disease     CVA by CT in 2007,but not noted in 2009;duplex in 12/2006 plaque without stenosis; Emergency Room evaluation in 12/2010 for transient aphasia and paresis-clopidogrel initially added to ASA Rx, but subsequently warfarin was substituted for these 2 agents  . Tobacco abuse, in remission     quit in 1995  . Claudication   . Kyphosis     severe  . Atrial fibrillation   . Chronic anticoagulation   . TIA (transient ischemic attack)   . Renal disorder   . Glass eye     right-1990  . HOH (hard of hearing)   . Wears glasses   . GERD (gastroesophageal reflux disease)   . Alzheimer's dementia   . COPD (chronic obstructive pulmonary disease)     Medications:  Prescriptions prior to admission  Medication Sig Dispense Refill Last Dose  . albuterol (PROVENTIL) (2.5 MG/3ML) 0.083% nebulizer  solution Take 2.5 mg by nebulization every 4 (four) hours as needed. For shortness of breath/cough   unknown  . alendronate (FOSAMAX) 70 MG tablet Take 70 mg by mouth every 7 (seven) days. Take with a full glass of water on an empty stomach.    05/08/2015 at Unknown time  . cetirizine (ZYRTEC) 10 MG tablet Take 10 mg by mouth at bedtime.    05/10/2015 at Unknown time  . Cranberry-Cholecalciferol (SUPER CRANBERRY/VITAMIN D3) 4200-500 MG-UNIT CAPS Take 1 capsule by mouth every morning.   05/10/2015 at Unknown time  . Cyanocobalamin (VITAMIN B-12) 5000 MCG SUBL Place 5,000 mcg under the tongue every morning.    05/10/2015 at Unknown time  . docusate sodium (COLACE) 100 MG capsule Take 100 mg by mouth 2 (two) times daily.   05/10/2015 at Unknown time  . donepezil (ARICEPT) 5 MG tablet Take 5 mg by mouth at bedtime.   05/10/2015 at Unknown time  . feeding supplement, ENSURE ENLIVE, (ENSURE ENLIVE) LIQD Take 237 mLs by mouth 2 (two) times daily between meals. 237 mL 12 Past Month at Unknown time  . furosemide (LASIX) 40 MG tablet Take 0.5 tablets (20 mg total) by mouth as needed for fluid or edema. 30 tablet 0 unknown  . lisinopril (PRINIVIL,ZESTRIL) 2.5 MG tablet Take 2.5 mg by mouth every morning.    05/10/2015  at Unknown time  . LORazepam (ATIVAN) 0.5 MG tablet Take 1 tablet (0.5 mg total) by mouth every 12 (twelve) hours as needed for anxiety. Anxiety and or agitation. *Last dose is not to be given, if patient receives sleeping medication HALCION (TRIAZOLAM)* (Patient taking differently: Take 0.5 mg by mouth 3 (three) times daily as needed for anxiety. Anxiety and or agitation. *Last dose is not to be given, if patient receives sleeping medication HALCION (TRIAZOLAM)*) 30 tablet 0 05/10/2015 at Unknown time  . memantine (NAMENDA) 10 MG tablet Take 10 mg by mouth 2 (two) times daily.    05/10/2015 at Unknown time  . Multiple Vitamins-Minerals (MULTIVITAMIN WITH MINERALS) tablet Take 1 tablet by mouth every morning.     05/10/2015 at Unknown time  . nitroGLYCERIN (NITROSTAT) 0.4 MG SL tablet Place 0.4 mg under the tongue every 5 (five) minutes as needed. For chest pain    unknown  . omeprazole (PRILOSEC) 40 MG capsule Take 40 mg by mouth daily.   05/10/2015 at Unknown time  . potassium chloride SA (K-DUR,KLOR-CON) 20 MEQ tablet Take 1 tablet (20 mEq total) by mouth daily. 1 tablet on days that lasix is taken   unknown  . warfarin (COUMADIN) 2.5 MG tablet Take 1 tablet daily except 1/2 tablet on Wednesdays (Patient taking differently: Take 1.25-2.5 mg by mouth See admin instructions. Take 1 tablet daily except 1/2 tablet twice weekly) 30 tablet 3 05/10/2015 at Unknown time    Assessment: 81 yo lady on coumadin for afib to continue.  INR today remains elevated Goal of Therapy:  INR 2-3 Monitor platelets by anticoagulation protocol: Yes   Plan:  No coumadin today Check daily INR/PT.  Angela [name], Angela [name] 05/12/2015,11:42 AM

## 2015-05-13 ENCOUNTER — Inpatient Hospital Stay (HOSPITAL_COMMUNITY): Payer: Medicare Other

## 2015-05-13 DIAGNOSIS — I4891 Unspecified atrial fibrillation: Secondary | ICD-10-CM

## 2015-05-13 DIAGNOSIS — J9620 Acute and chronic respiratory failure, unspecified whether with hypoxia or hypercapnia: Secondary | ICD-10-CM | POA: Diagnosis present

## 2015-05-13 DIAGNOSIS — J189 Pneumonia, unspecified organism: Principal | ICD-10-CM

## 2015-05-13 DIAGNOSIS — F0390 Unspecified dementia without behavioral disturbance: Secondary | ICD-10-CM

## 2015-05-13 DIAGNOSIS — R627 Adult failure to thrive: Secondary | ICD-10-CM

## 2015-05-13 DIAGNOSIS — E876 Hypokalemia: Secondary | ICD-10-CM

## 2015-05-13 DIAGNOSIS — I5033 Acute on chronic diastolic (congestive) heart failure: Secondary | ICD-10-CM | POA: Diagnosis present

## 2015-05-13 LAB — PROTIME-INR
INR: 4.98 — ABNORMAL HIGH (ref 0.00–1.49)
Prothrombin Time: 44.8 seconds — ABNORMAL HIGH (ref 11.6–15.2)

## 2015-05-13 LAB — BASIC METABOLIC PANEL
Anion gap: 10 (ref 5–15)
BUN: 11 mg/dL (ref 6–20)
CO2: 35 mmol/L — ABNORMAL HIGH (ref 22–32)
CREATININE: 0.98 mg/dL (ref 0.44–1.00)
Calcium: 8.6 mg/dL — ABNORMAL LOW (ref 8.9–10.3)
Chloride: 100 mmol/L — ABNORMAL LOW (ref 101–111)
GFR calc Af Amer: 56 mL/min — ABNORMAL LOW (ref >60)
GFR calc non Af Amer: 49 mL/min — ABNORMAL LOW (ref >60)
Glucose, Bld: 108 mg/dL — ABNORMAL HIGH (ref 65–99)
Potassium: 3.1 mmol/L — ABNORMAL LOW (ref 3.5–5.1)
Sodium: 145 mmol/L (ref 135–145)

## 2015-05-13 LAB — URINE CULTURE

## 2015-05-13 LAB — MAGNESIUM: MAGNESIUM: 2.1 mg/dL (ref 1.7–2.4)

## 2015-05-13 MED ORDER — ALBUTEROL SULFATE (2.5 MG/3ML) 0.083% IN NEBU
2.5000 mg | INHALATION_SOLUTION | RESPIRATORY_TRACT | Status: DC | PRN
  Administered 2015-05-16 – 2015-05-18 (×3): 2.5 mg via RESPIRATORY_TRACT
  Filled 2015-05-13 (×3): qty 3

## 2015-05-13 MED ORDER — POTASSIUM CHLORIDE 20 MEQ PO PACK
40.0000 meq | PACK | Freq: Once | ORAL | Status: AC
  Administered 2015-05-13: 40 meq via ORAL
  Filled 2015-05-13: qty 2

## 2015-05-13 MED ORDER — FUROSEMIDE 20 MG PO TABS
20.0000 mg | ORAL_TABLET | Freq: Every day | ORAL | Status: DC
  Administered 2015-05-13 – 2015-05-14 (×2): 20 mg via ORAL
  Filled 2015-05-13 (×2): qty 1

## 2015-05-13 NOTE — Care Management Note (Signed)
Case Management Note  Patient Details  Name: ELAYNE GRUVER MRN: [id number] Date of Birth: [birth year]  Subjective/Objective:                  Pt admitted from home with pneumonia. Pt lives with son and they are inquiring about possible SNF at discharge.   Action/Plan: PT has recommended SNF. CSW is aware and will start bed search.  Expected Discharge Date:  05/14/15               Expected Discharge Plan:  Skilled Nursing Facility  In-House Referral:  Clinical Social Work  Discharge planning Services  CM Consult  Post Acute Care Choice:  NA Choice offered to:  NA  DME Arranged:    DME Agency:     HH Arranged:    HH Agency:     Status of Service:  In process, will continue to follow  Medicare Important Message Given:    Date Medicare IM Given:    Medicare IM give by:    Date Additional Medicare IM Given:    Additional Medicare Important Message give by:     If discussed at Long Length of Stay Meetings, dates discussed:    Additional Comments:  Cheryl Flash, RN 05/13/2015, 1:02 PM

## 2015-05-13 NOTE — Plan of Care (Signed)
Problem: Acute Rehab PT Goals(only PT should resolve) Goal: Pt Will Go Supine/Side To Sit Pt will demonstrate ModA bed mobility supine to sitting edge-of-bed to return to PLOF and to decrease caregiver burden.     Goal: Patient Will Transfer Sit To/From Stand Pt will transfer sit to/from-stand with RW at [facility] without loss-of-balance to demonstrate good safety awareness for independent mobility in home.     Goal: Pt Will Ambulate Pt will ambulate with RW at Supervision using a step-through pattern and equal step length for a distances greater than 32ft to demonstrate the ability to perform safe household distance ambulation at discharge.

## 2015-05-13 NOTE — Progress Notes (Signed)
ANTICOAGULATION CONSULT NOTE  Pharmacy Consult for coumadin Indication: atrial fibrillation  Allergies  Allergen Reactions  . Haldol [Haloperidol Decanoate]     Over sedation  . Sulfa Antibiotics Other (See Comments)    uknown    Patient Measurements: Height: 5\' 4"  (162.6 cm) Weight: 119 lb 4.3 oz (54.1 kg) IBW/kg (Calculated) : 54.7   Vital Signs: Temp: 98.1 F (36.7 C) (08/10 0633) Temp Source: Axillary (08/10 1610) BP: 139/89 mmHg (08/10 0633) Pulse Rate: 93 (08/10 0633)  Labs:  Recent Labs  05/11/15 1155 05/11/15 1210 05/12/15 1615 05/13/15 0658  HGB 13.8  --  12.3  --   HCT 43.7  --  39.6  --   PLT 230  --  210  --   LABPROT  --  34.4* 48.0* 44.8*  INR  --  3.50* 5.39* 4.98*  CREATININE 0.97  --  0.97 0.98  TROPONINI 0.04*  --  <0.03  --     Estimated Creatinine Clearance: 31.3 mL/min (by C-G formula based on Cr of 0.98).   Medical History: Past Medical History  Diagnosis Date  . ASCVD (arteriosclerotic cardiovascular disease)     acute MI in 1993;40%left main,80%LAD, 100%RCA-PTCA; EF of 40%;stress nuclear in2007; normal EF;  mixed inferolateral ischemia and infarction; CHF with normal EF in 2011  . CHF (congestive heart failure)     normal EF;mixed inferolateral ischemic and infarction;chf with normal EF in 2011  . Hypertension   . Hyperlipidemia   . Cerebrovascular disease     CVA by CT in 2007,but not noted in 2009;duplex in 12/2006 plaque without stenosis; Emergency Room evaluation in 12/2010 for transient aphasia and paresis-clopidogrel initially added to ASA Rx, but subsequently warfarin was substituted for these 2 agents  . Tobacco abuse, in remission     quit in 1995  . Claudication   . Kyphosis     severe  . Atrial fibrillation   . Chronic anticoagulation   . TIA (transient ischemic attack)   . Renal disorder   . Glass eye     right-1990  . HOH (hard of hearing)   . Wears glasses   . GERD (gastroesophageal reflux disease)   .  Alzheimer's dementia   . COPD (chronic obstructive pulmonary disease)     Medications:  Prescriptions prior to admission  Medication Sig Dispense Refill Last Dose  . albuterol (PROVENTIL) (2.5 MG/3ML) 0.083% nebulizer solution Take 2.5 mg by nebulization every 4 (four) hours as needed. For shortness of breath/cough   unknown  . alendronate (FOSAMAX) 70 MG tablet Take 70 mg by mouth every 7 (seven) days. Take with a full glass of water on an empty stomach.    05/08/2015 at Unknown time  . cetirizine (ZYRTEC) 10 MG tablet Take 10 mg by mouth at bedtime.    05/10/2015 at Unknown time  . Cranberry-Cholecalciferol (SUPER CRANBERRY/VITAMIN D3) 4200-500 MG-UNIT CAPS Take 1 capsule by mouth every morning.   05/10/2015 at Unknown time  . Cyanocobalamin (VITAMIN B-12) 5000 MCG SUBL Place 5,000 mcg under the tongue every morning.    05/10/2015 at Unknown time  . docusate sodium (COLACE) 100 MG capsule Take 100 mg by mouth 2 (two) times daily.   05/10/2015 at Unknown time  . donepezil (ARICEPT) 5 MG tablet Take 5 mg by mouth at bedtime.   05/10/2015 at Unknown time  . feeding supplement, ENSURE ENLIVE, (ENSURE ENLIVE) LIQD Take 237 mLs by mouth 2 (two) times daily between meals. 237 mL 12 Past Month at  Unknown time  . furosemide (LASIX) 40 MG tablet Take 0.5 tablets (20 mg total) by mouth as needed for fluid or edema. 30 tablet 0 unknown  . lisinopril (PRINIVIL,ZESTRIL) 2.5 MG tablet Take 2.5 mg by mouth every morning.    05/10/2015 at Unknown time  . LORazepam (ATIVAN) 0.5 MG tablet Take 1 tablet (0.5 mg total) by mouth every 12 (twelve) hours as needed for anxiety. Anxiety and or agitation. *Last dose is not to be given, if patient receives sleeping medication HALCION (TRIAZOLAM)* (Patient taking differently: Take 0.5 mg by mouth 3 (three) times daily as needed for anxiety. Anxiety and or agitation. *Last dose is not to be given, if patient receives sleeping medication HALCION (TRIAZOLAM)*) 30 tablet 0 05/10/2015 at Unknown  time  . memantine (NAMENDA) 10 MG tablet Take 10 mg by mouth 2 (two) times daily.    05/10/2015 at Unknown time  . Multiple Vitamins-Minerals (MULTIVITAMIN WITH MINERALS) tablet Take 1 tablet by mouth every morning.    05/10/2015 at Unknown time  . nitroGLYCERIN (NITROSTAT) 0.4 MG SL tablet Place 0.4 mg under the tongue every 5 (five) minutes as needed. For chest pain    unknown  . omeprazole (PRILOSEC) 40 MG capsule Take 40 mg by mouth daily.   05/10/2015 at Unknown time  . potassium chloride SA (K-DUR,KLOR-CON) 20 MEQ tablet Take 1 tablet (20 mEq total) by mouth daily. 1 tablet on days that lasix is taken   unknown  . warfarin (COUMADIN) 2.5 MG tablet Take 1 tablet daily except 1/2 tablet on Wednesdays (Patient taking differently: Take 1.25-2.5 mg by mouth See admin instructions. Take 1 tablet daily except 1/2 tablet twice weekly) 30 tablet 3 05/10/2015 at Unknown time    Assessment: 81 yo lady on coumadin for afib to continue.  INR today remains elevated Goal of Therapy:  INR 2-3 Monitor platelets by anticoagulation protocol: Yes   Plan:  No coumadin today Check daily INR/PT.  Cdebaca, Kazuki Ingle P[place]et 05/13/2015,11:04 AM

## 2015-05-13 NOTE — Consult Note (Signed)
WOC wound consult note Reason for Consult: Chronic injury to Right LE (anterior aspect)-a full thickness skin tear (avulsion).  No injuries noted to have been sustained during fall.  Daughter and son with patient at the time of my assessment.  Daughter lives in [place], patient lives with son. Sacrum and heels intact. Patient is incontinence of stool during my assessment.  Wound type:Traumatic Pressure Ulcer POA: No Measurement:2cm x 1.5cm x 0.4cm Wound WUJ:WJXBJ, red, moist, free of necrotic tissue Drainage (amount, consistency, odor) scant serosanguinous on old dressing Periwound:intact with age and medication related skin changes and purpura. Dressing procedure/placement/frequency:I will provide orders for a pressure redistribution chair pad and heel floatation for pressure injury prevention.  Additionally, orders for topical care (that is non-adherent) is provided for Nursing. We will continue to the side to side positioning and timely incontinence care using our house products. Thank you for this consultation.  The WOC nursing team will not follow, but will remain available to this patient, the nursing and medical teams.  Please re-consult if needed. Thanks, Ladona Mow, MSN, RN, GNP, [place], CWON-AP [phone])

## 2015-05-13 NOTE — Evaluation (Signed)
Physical Therapy Evaluation Patient Details Name: Angela [name] MRN: [id number] DOB: 12-18-1920 Today's Date: 05/13/2015   History of Present Illness  Angela [name] is a 81 y.o. female has a past medical history significant for HTN, HLD, CAD, Aortic stenosis, diastolic CHF, is being brought to the hospital by her family after a sudden onset of malaise, fever, and tachypnea. Pt is a poor historian due to advanced demetia, history is obtained from daughter/ son.   Clinical Impression  Pt is received semirecumbent in bed upon entry, awake, alert, and willing to participate. Acute pain noted only during sit-to-supine, which resolved [name] position, family reports as new and concerning. Pt is disoriented at baseline due to progressive demetnia, but follows simple commands 75% [name] encouragement. Family reports one falls in the last 6 months (4 days ago) but denies any other falls, however describes consistent history of near-falls requiring assistance to recover balance. Pt strength as screened by functional mobility assessment is very weak, requiring max-total assist for bed mobility. Pt is able to come to partial standing more easily, but unable to initiate ambulation. Pt remaining on 2L O2 throughout evaluation, but desaturating with activity (92%), whereas pt is not on O2 at baseline at home. Patient presenting with impairment of strength, range of motion, balance, oxygen perfusion, and activity tolerance, limiting ability to perform ADL and mobility tasks at  baseline level of function. Patient will benefit from skilled intervention to address the above impairments and limitations, in order to restore to prior level of function, improve patient safety upon discharge, and to decrease falls risk.       Follow Up Recommendations SNF;Supervision for mobility/OOB;Supervision/Assistance - 24 hour    Equipment Recommendations  None recommended by PT    Recommendations for Other Services       Precautions /  Restrictions Precautions Precautions: Fall Precaution Comments: history of falls, balance deficit Restrictions Weight Bearing Restrictions: No      Mobility  Bed Mobility Overal bed mobility: +2 for physical assistance;Needs Assistance Bed Mobility: Supine to Sit;Sit to Supine     Supine to sit: Max assist Sit to supine: Max assist      Transfers Overall transfer level: Needs assistance Equipment used: 1 person hand held assist (Try RW next visit. ) Transfers: Sit to/from Stand Sit to Stand: Mod assist         General transfer comment: performed 2x, requires 4 attempts, slow, and unable to come to foll upright stance secondary to kyphosis. Unable to remain standing for >30seconds per bout  Ambulation/Gait Ambulation/Gait assistance:  (unable, pt fearful of falling/ weak. )              Stairs            Wheelchair Mobility    Modified Rankin (Stroke Patients Only)       Balance Overall balance assessment: History of Falls (Not foprmally assessed; family reports strong prevalence for LOB backwards. )                                           Pertinent Vitals/Pain Pain Assessment: Faces Faces Pain Scale: Hurts even more Pain Location: Pt unable to rate due to demented state, but grimacing [name] certain changes in position. Pt gestures toward lower abdomen when asked about pain.  Pain Intervention(s): Monitored during session;Limited activity within patient's tolerance    Home Living  Family/patient expects to be discharged to:: Private residence Living Arrangements: Children (Son.) Available Help at Discharge: Personal care attendant;Family (Son, and intermittent CAP aid. ) Type of Home: House Home Access: Ramped entrance     Home Layout: One level Home Equipment: Hospital bed;Walker - 2 wheels;Wheelchair - manual Additional Comments: Requires mod A for balance during HH ambulation distances [name] RW.     Prior Function Level of  Independence: Needs assistance   Gait / Transfers Assistance Needed: requires modA to recover loss of balance.   ADL's / Homemaking Assistance Needed: assistance [name] bathing, dressing, toiletting; able to self feed 25% of time.   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     Hand Dominance        Extremity/Trunk Assessment   Upper Extremity Assessment: Generalized weakness           Lower Extremity Assessment: Generalized weakness      Cervical / Trunk Assessment: Kyphotic  Communication   Communication: HOH  Cognition Arousal/Alertness: Awake/alert Behavior During Therapy: WFL for tasks assessed/performed Overall Cognitive Status: History of cognitive impairments - at baseline   Orientation Level:  (Pt unable to identify daughter or son in room. )                  General Comments      Exercises        Assessment/Plan    PT Assessment Patient needs continued PT services  PT Diagnosis Difficulty walking;Generalized weakness;Acute pain   PT Problem List Decreased strength;Decreased activity tolerance;Decreased mobility;Pain  PT Treatment Interventions Functional mobility training;Therapeutic activities;Therapeutic exercise;Patient/family education   PT Goals (Current goals can be found in the Care Plan section) Acute Rehab PT Goals Patient Stated Goal: Family would like pt to return to PLOF in assisting [name] transfers and bed mobility. PT Goal Formulation: With family Time For Goal Achievement: 05/27/15 Potential to Achieve Goals: Fair    Frequency Min 3X/week   Barriers to discharge        Co-evaluation               End of Session Equipment Utilized During Treatment: Oxygen Activity Tolerance: Patient limited by fatigue;Patient limited by lethargy;Patient limited by pain Patient left: in bed;with family/visitor present Nurse Communication: Other (comment)         Time: 4098-1191 PT Time Calculation (min) (ACUTE ONLY): 37  min   Charges:   PT Evaluation $Initial PT Evaluation Tier I: 1 Procedure PT Treatments $Therapeutic Activity: 8-22 mins   PT G Codes:        Angela [name] May 30, 2015, 12:29 PM  12:34 PM  Rosamaria Lints, PT, DPT [place] License # [id number]

## 2015-05-13 NOTE — Clinical Social Work Note (Signed)
Clinical Social Work Assessment  Patient Details  Name: Angela [name] MRN: [id number] Date of Birth: 07-08-1921  Date of referral:  05/13/15               Reason for consult:  Facility Placement                Permission sought to share information with:    Permission granted to share information::     Name::        Agency::     Relationship::     Contact Information:     Housing/Transportation Living arrangements for the past 2 months:  Single Family Home Source of Information:  Adult Children Patient Interpreter Needed:  None Criminal Activity/Legal Involvement Pertinent to Current Situation/Hospitalization:  No - Comment as needed Significant Relationships:  Adult Children Lives with:  Adult Children Do you feel safe going back to the place where you live?  Yes Need for family participation in patient care:  No (Coment)  Care giving concerns:  Family feels that since the onset of patient's current illness, she needs more care than can be provided at home.    Social Worker assessment / plan:  CSW met with patient whose children Angela [name], Brooke Bonito., Angela [name] and Angela [name]) were at bedside.  [name] provided most of the history.  Family indicated that at baseline patient had been walking with a walker.  They advised that patient lives in the home with her son, Mr. Angela [name].  Family reported that a caregiver comes to the home and stays during the day.  [name] advised that over the days preceeding patient's hospitalizaiton, patient would not eat/drink, would not get up to walk and had to be fed.  They reported that at baseline, patient fed herself.  CSW discussed the purpose of the referral.  Family indicated that they were only interested in [facility].  They also reported that they were interested in patient being a long term resident of the facility. CSW advised that patient would would be going for rehab purposes and any plans for long term care would have to be discussed arranged by  patient's family and facility.   Employment status:  Retired Nurse, adult PT Recommendations:  [place] / Referral to community resources:  [place]  Patient/Family's Response to care:  Family is agreeable for patient to go to SNF.   Patient/Family's Understanding of and Emotional Response to Diagnosis, Current Treatment, and Prognosis: Family understands patients diagnosis, treatment and prognosis and are interested in long term care.   Emotional Assessment Appearance:  Developmentally appropriate Attitude/Demeanor/Rapport:  Unable to Assess Affect (typically observed):  Unable to Assess (Patient was asleep) Orientation:   (Patient was asleep.) Alcohol / Substance use:  Not Applicable Psych involvement (Current and /or in the community):     Discharge Needs  Concerns to be addressed:  Discharge Planning Concerns Readmission within the last 30 days:  No Current discharge risk:  None Barriers to Discharge:  No Barriers Identified   Ihor Gully, LCSW 05/13/2015, 2:21 PM [phone]

## 2015-05-13 NOTE — Progress Notes (Signed)
[facility] PROGRESS NOTE  Angela [name] NFA:[id number] DOB: [birth year] DOA: 05/11/2015 PCP: Cassell Smiles., MD  Assessment/Plan: 1. Pneumonia with tachypnea. Seems to be improving. Hemodynamically stable, oxygen stable. Will continue oxygen via [place] and Vanc and Zosyn for coverage. Possibly transition to oral abx tomorrow if continues to improve.   2. Acute on chronic respiratory failure with hypoxia. Pt uses oxygen prn at home. Will cont on supplemental oxygen for now as she recovers from PNA. Will start back on home dose of Lasix.  3. COPD. Stable.  4. Fall. Family reports pt fell at home prior to admission and has complaint of right lower abdominal and hip pain upon movement. Tylenol ordered for pain management. Will order CT Abdominal and Pelvis. Evaluated by PT today. Recommend therapy prior to discharge and SNF;Supervision for mobility/OOB;Supervision/Assistance - 24 hour. Placed on fall precautions.  5. Advanced Alzheimer's dementia. 6. Failure to thrive. Nutrition consult.  7. Acute on Chronic diastolic congestive heart failure. Last echocardiogram on file from May 2016 with normal EF. CXR done on 05/11/15 reveals possible pulmonary edema vs conslidation. Exam suggestive of PNA although she does have evidence of volume overload. Will start the pt on her home dose of Lasix. 8. Hypokalemia. Improving with potassium repletion.  9. Atrial fibrillation. On warfarin as outpatient, but will hold warfarin until she trends down, as INR has increased with antibiotics, 4.9 today. No obvious evidence of bleeding. 10. 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 The wound is to the anterior aspect of the extremity. Evaluated today by WOC, will order a pressure redistribution chair pad and heel floatation for pressure injury prevention.   Code Status: DNR/DNI DVT prophylaxis: on coumadin Family Communication: family (daughters and son) is at bedside. Case discussed in detail, no concerns at this time.   Disposition Plan: SNF when ready    Consultants:  PT, recommends therapy prior to discharge   WOC  Nutrition   Procedures:    Antibiotics:  Vancomycin 8/9>>  Zosyn 8/9 >>   HPI/Subjective: Family reports they have gotten pt to eat and she is drinking mildly. No cough/choking secondary to eating or drinking. Family report that pt has right lower abdominal and back pain when moved. She has had regular BMs, last BM was this morning.   She typically walks with a walker at home. She normally wears oxygen at home as needed, but does breathing treatments at home in the morning and evenings.   Objective: Filed Vitals:   05/13/15 1212  BP:   Pulse: 67  Temp:   Resp:     Intake/Output Summary (Last 24 hours) at 05/13/15 1234 Last data filed at 05/13/15 0836  Gross per 24 hour  Intake    170 ml  Output      0 ml  Net    170 ml   Filed Weights   05/11/15 1130 05/11/15 1556 05/13/15 0633  Weight: 53.978 kg (119 lb) 53.978 kg (119 lb) 54.1 kg (119 lb 4.3 oz)    Exam:  NAD. VSS. General:  Appears comfortable, calm. Cardiovascular: RRR. 2-3/6 systolic ejection murmur. 1+ lower extremity edema. Respiratory: crackles at the bases bilaterally. Normal respiratory effort. Abdomen: soft, TTP in the RLQ. Bowel sounds are active.   Musculoskeletal: grossly normal tone bilateral upper and lower extremities Neurologic: grossly non-focal.  Data Reviewed: Basic Metabolic Panel:  Recent Labs Lab 05/11/15 1155 05/12/15 1615 05/13/15 0658  NA 144 141 145  K 2.2* 2.6* 3.1*  CL 101 100* 100*  CO2 35*  32 35*  GLUCOSE 104* 93 108*  BUN CREATININE 0.97 0.97 0.98  CALCIUM 8.9 8.0* 8.6*  MG  --   --  2.1   Liver Function Tests:  Recent Labs Lab 05/11/15 1155  AST 18  ALT 12*  ALKPHOS 77  BILITOT 0.9  PROT 6.2*  ALBUMIN 3.4*   No results for input(s): LIPASE, AMYLASE in the last 168 hours. No results for input(s): AMMONIA in the last 168  hours. CBC:  Recent Labs Lab 05/11/15 1155 05/12/15 1615  WBC 11.2* 11.1*  HGB 13.8 12.3  HCT 43.7 39.6  MCV 91.4 91.9  PLT 230 210   Cardiac Enzymes:  Recent Labs Lab 05/11/15 1155 05/12/15 1615  TROPONINI 0.04* <0.03   BNP (last 3 results)  Recent Labs  05/11/15 1207  BNP 399.0*    ProBNP (last 3 results) No results for input(s): PROBNP in the last 8760 hours.  CBG: No results for input(s): GLUCAP in the last 168 hours.  Recent Results (from the past 240 hour(s))  Urine culture     Status: None   Collection Time: 05/11/15 11:38 AM  Result Value Ref Range Status   Specimen Description URINE, CATHETERIZED  Final   Special Requests NONE  Final   Culture   Final    MULTIPLE SPECIES PRESENT, SUGGEST RECOLLECTION Performed at [facility]    Report Status 05/13/2015 FINAL  Final  Blood Culture (routine x 2)     Status: None (Preliminary result)   Collection Time: 05/11/15 11:55 AM  Result Value Ref Range Status   Specimen Description BLOOD RIGHT ARM DRAWN BY RN  Final   Special Requests BOTTLES DRAWN AEROBIC AND ANAEROBIC 4CC  Final   Culture NO GROWTH < 24 HOURS  Final   Report Status PENDING  Incomplete  Blood Culture (routine x 2)     Status: None (Preliminary result)   Collection Time: 05/11/15 12:10 PM  Result Value Ref Range Status   Specimen Description BLOOD LEFT ANTECUBITAL  Final   Special Requests BOTTLES DRAWN AEROBIC AND ANAEROBIC 10CC  Final   Culture NO GROWTH < 24 HOURS  Final   Report Status PENDING  Incomplete     Studies: Ct Head Wo Contrast  05/11/2015   CLINICAL DATA:  81 year old female with lethargy. Subsequent encounter.  EXAM: CT HEAD WITHOUT CONTRAST  TECHNIQUE: Contiguous axial images were obtained from the base of the skull through the vertex without intravenous contrast.  COMPARISON:  02/15/2015.  FINDINGS: Exam is motion degraded.  No intracranial hemorrhage.  Remote left medial temporal lobe infarct.  Prominent small  vessel disease type changes without CT evidence of large acute infarct.  Global atrophy without hydrocephalus.  Prominent falx and tentorial calcifications.  Vascular calcifications.  Post right orbital exoneration.  No calvarial abnormality. Mastoid air cells, middle ear cavities and visualized paranasal sinuses are clear.  IMPRESSION: Exam is motion degraded.  No intracranial hemorrhage.  Remote left medial temporal lobe infarct.  Prominent small vessel disease type changes without CT evidence of large acute infarct.  Global atrophy without hydrocephalus.   Electronically Signed   By: Lacy Duverney M.D.   On: 05/11/2015 13:16    Scheduled Meds: . albuterol  2.5 mg Nebulization BID  . donepezil  5 mg Oral QHS  . lisinopril  2.5 mg Oral q morning - 10a  . loratadine  10 mg Oral Daily  . LORazepam  0.5 mg Oral TID  . memantine  10 mg Oral BID  . pantoprazole  40 mg Oral Daily  . piperacillin-tazobactam (ZOSYN)  IV  3.375 g Intravenous Q8H  . potassium chloride SA  20 mEq Oral Daily  . sodium chloride  3 mL Intravenous Q12H  . vancomycin  750 mg Intravenous Q24H   Continuous Infusions:   Principal Problem:   Pneumonia Active Problems:   Dementia   Chronic anticoagulation   Atrial fibrillation   FTT (failure to thrive) in adult   Hypokalemia    Time spent: 40 minutes  Erick Blinks, M.D. [facility] Pager [phone]. If 7PM-7AM, please contact night-coverage at [email or web address], password [facility] 05/13/2015, 12:34 PM  LOS: 2 days     I, Norg'e Tisdol, acting a scribe, recorded this note contemporaneously in the presence of Dr. Erick Blinks, M.D. on 05/13/2015 at 12:39 PM   I have reviewed the above documentation for accuracy and completeness, and I agree with the above.  Kaizlee Carlino

## 2015-05-14 DIAGNOSIS — I5033 Acute on chronic diastolic (congestive) heart failure: Secondary | ICD-10-CM

## 2015-05-14 DIAGNOSIS — J9621 Acute and chronic respiratory failure with hypoxia: Secondary | ICD-10-CM

## 2015-05-14 LAB — CBC
HCT: 40.3 % (ref 36.0–46.0)
Hemoglobin: 12.3 g/dL (ref 12.0–15.0)
MCH: 28.5 pg (ref 26.0–34.0)
MCHC: 30.5 g/dL (ref 30.0–36.0)
MCV: 93.3 fL (ref 78.0–100.0)
PLATELETS: 217 10*3/uL (ref 150–400)
RBC: 4.32 MIL/uL (ref 3.87–5.11)
RDW: 13.8 % (ref 11.5–15.5)
WBC: 9.7 10*3/uL (ref 4.0–10.5)

## 2015-05-14 LAB — BASIC METABOLIC PANEL
Anion gap: 6 (ref 5–15)
BUN: 12 mg/dL (ref 6–20)
CALCIUM: 8.3 mg/dL — AB (ref 8.9–10.3)
CO2: 36 mmol/L — ABNORMAL HIGH (ref 22–32)
Chloride: 101 mmol/L (ref 101–111)
Creatinine, Ser: 1.1 mg/dL — ABNORMAL HIGH (ref 0.44–1.00)
GFR calc Af Amer: 49 mL/min — ABNORMAL LOW (ref >60)
GFR, EST NON AFRICAN AMERICAN: 42 mL/min — AB (ref >60)
GLUCOSE: 98 mg/dL (ref 65–99)
POTASSIUM: 2.8 mmol/L — AB (ref 3.5–5.1)
Sodium: 143 mmol/L (ref 135–145)

## 2015-05-14 LAB — PROTIME-INR
INR: 5.15 — AB (ref 0.00–1.49)
PROTHROMBIN TIME: 46 s — AB (ref 11.6–15.2)

## 2015-05-14 MED ORDER — IPRATROPIUM-ALBUTEROL 0.5-2.5 (3) MG/3ML IN SOLN
3.0000 mL | Freq: Four times a day (QID) | RESPIRATORY_TRACT | Status: DC
  Administered 2015-05-14: 3 mL via RESPIRATORY_TRACT
  Filled 2015-05-14: qty 3

## 2015-05-14 MED ORDER — POTASSIUM CHLORIDE 20 MEQ PO PACK
40.0000 meq | PACK | ORAL | Status: AC
  Administered 2015-05-14 (×3): 40 meq via ORAL
  Filled 2015-05-14 (×3): qty 2

## 2015-05-14 MED ORDER — IPRATROPIUM-ALBUTEROL 0.5-2.5 (3) MG/3ML IN SOLN
3.0000 mL | Freq: Three times a day (TID) | RESPIRATORY_TRACT | Status: DC
  Administered 2015-05-14 – 2015-05-18 (×8): 3 mL via RESPIRATORY_TRACT
  Filled 2015-05-14 (×10): qty 3

## 2015-05-14 MED ORDER — ACETAMINOPHEN 325 MG PO TABS
650.0000 mg | ORAL_TABLET | Freq: Four times a day (QID) | ORAL | Status: DC
  Administered 2015-05-14 – 2015-05-15 (×4): 650 mg via ORAL
  Filled 2015-05-14 (×5): qty 2

## 2015-05-14 NOTE — Progress Notes (Signed)
CRITICAL VALUE ALERT  Critical value received:  PT/INR  Date of notification:  05/14/2015  Time of notification:  0741  Critical value read back:Yes.    Nurse who received alert:  Pearlie Oyster RN  MD notified (1st page):  Memon paged  Time of first page:  [phone]

## 2015-05-14 NOTE — Progress Notes (Signed)
[facility] PROGRESS NOTE  Angela [name] ZOX:[id number] DOB: [birth year] DOA: 05/11/2015 PCP: Cassell Smiles., MD  Assessment/Plan: 1. Pneumonia with tachypnea. Seems to be improving. Hemodynamically stable, oxygen stable. Will continue oxygen via [place] and Vanc and Zosyn for coverage. Will transition to oral abx tomorrow as she shows improvement.   2. Acute on chronic respiratory failure with hypoxia. Pt uses oxygen prn at home. Will continue on supplemental oxygen for now as she recovers from PNA. Will continue on home dose of Lasix.  3. COPD. Has some evidence of wheezing, but may be related to aspiration.  Continue with bronchodilators and pulmonary hygiene.  4. Possible aspiration.  Will order swallow test and switch to soft food diet. 5. Fall. Family reports pt fell at home prior to admission and has complained of right lower abdominal and hip pain upon movement. Tylenol ordered q4-6 hours for pain management. CT A&P reveals DDD in the lower L-spine, cholelithiasis without cholecystitis, atherosclerosis of abdominal aorta, and 6.7 cm simple left adnexal cyst. Evaluated by PT today. Recommend therapy prior to discharge and SNF;Supervision for mobility/OOB;Supervision/Assistance - 24 hour. Placed on fall precautions. 6. Abdominal pain.  CT scan of abdomen and pelvis showed no acute findings.  Continue pain management.  7. Advanced Alzheimer's dementia.  8. Failure to thrive.  9. Acute on Chronic diastolic congestive heart failure. Last echocardiogram on file from May 2016 with normal EF. CXR done on 05/11/15 reveals possible pulmonary edema vs conslidation. Exam suggestive of PNA although she does have evidence of volume overload. Will hold Lasix due to increased creatinine. 10. Hypokalemia. Trending downward, will continue with potassium repletion.  11. Atrial fibrillation. On warfarin as outpatient, but will hold warfarin until she trends down, as INR has increased with antibiotics, 5.15 today.  No obvious evidence of bleeding. 12. 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 The wound is to the anterior aspect of the extremity. Evaluated today by WOC, will continue to use pressure redistribution chair pad and heel floatation for pressure injury prevention.   Code Status: DNR/DNI DVT prophylaxis: on Coumadin Family Communication:  Daughter is at bedside. Case discussed in detail, no concerns at this time.  Disposition Plan: SNF once improved    Consultants:  PT, recommends therapy prior to discharge   WOC  Nutrition   Procedures:    Antibiotics:  Vancomycin 8/9>>  Zosyn 8/9 >>   HPI/Subjective: Family states pt has been "strangling", wheezing and trying to cough up phlegm.  Daughter requested breathing treatment.  States pt. still "screaming" when moved. Pt reports pain on deep breath   Daughter says she requested a soft food diet, not pureed; pt eats "normal" food at home.  .  Objective: Filed Vitals:   05/14/15 0722  BP: 110/75  Pulse: 75  Temp: 98.1 F (36.7 C)  Resp: 15    Intake/Output Summary (Last 24 hours) at 05/14/15 0744 Last data filed at 05/13/15 1800  Gross per 24 hour  Intake    120 ml  Output      0 ml  Net    120 ml   Filed Weights   05/11/15 1130 05/11/15 1556 05/13/15 0633  Weight: 53.978 kg (119 lb) 53.978 kg (119 lb) 54.1 kg (119 lb 4.3 oz)    Exam:  NAD. VSS. General:  Appears comfortable, asleep in bed when arrived in room. Cardiovascular: irregular rhythm. 2-3/6 systolic ejection murmur. Respiratory: Diminished breath sounds at the bases bilaterally. Mild upper respiratory wheezes. Abdomen: soft, TTP in the RLQ. Bowel  sounds are present.   Musculoskeletal: 1+ lower extremity edema. Neurologic: grossly non-focal.   Data Reviewed: Basic Metabolic Panel:  Recent Labs Lab 05/11/15 1155 05/12/15 1615 05/13/15 0658 05/14/15 0644  NA 144 141 145 143  K 2.2* 2.6* 3.1* 2.8*  CL 101 100* 100* 101  CO2 35* 32 35* 36*  GLUCOSE 104* 93  108* 98  BUN CREATININE 0.97 0.97 0.98 1.10*  CALCIUM 8.9 8.0* 8.6* 8.3*  MG  --   --  2.1  --    Liver Function Tests:  Recent Labs Lab 05/11/15 1155  AST 18  ALT 12*  ALKPHOS 77  BILITOT 0.9  PROT 6.2*  ALBUMIN 3.4*   No results for input(s): LIPASE, AMYLASE in the last 168 hours. No results for input(s): AMMONIA in the last 168 hours. CBC:  Recent Labs Lab 05/11/15 1155 05/12/15 1615 05/14/15 0644  WBC 11.2* 11.1* 9.7  HGB 13.8 12.3 12.3  HCT 43.7 39.6 40.3  MCV 91.4 91.9 93.3  PLT 230 210 217   Cardiac Enzymes:  Recent Labs Lab 05/11/15 1155 05/12/15 1615  TROPONINI 0.04* <0.03   BNP (last 3 results)  Recent Labs  05/11/15 1207  BNP 399.0*    ProBNP (last 3 results) No results for input(s): PROBNP in the last 8760 hours.  CBG: No results for input(s): GLUCAP in the last 168 hours.  Recent Results (from the past 240 hour(s))  Urine culture     Status: None   Collection Time: 05/11/15 11:38 AM  Result Value Ref Range Status   Specimen Description URINE, CATHETERIZED  Final   Special Requests NONE  Final   Culture   Final    MULTIPLE SPECIES PRESENT, SUGGEST RECOLLECTION Performed at [facility]    Report Status 05/13/2015 FINAL  Final  Blood Culture (routine x 2)     Status: None (Preliminary result)   Collection Time: 05/11/15 11:55 AM  Result Value Ref Range Status   Specimen Description BLOOD RIGHT ARM DRAWN BY RN  Final   Special Requests BOTTLES DRAWN AEROBIC AND ANAEROBIC 4CC  Final   Culture NO GROWTH 2 DAYS  Final   Report Status PENDING  Incomplete  Blood Culture (routine x 2)     Status: None (Preliminary result)   Collection Time: 05/11/15 12:10 PM  Result Value Ref Range Status   Specimen Description BLOOD LEFT ANTECUBITAL  Final   Special Requests BOTTLES DRAWN AEROBIC AND ANAEROBIC 10CC  Final   Culture NO GROWTH 2 DAYS  Final   Report Status PENDING  Incomplete     Studies: Ct Abdomen Pelvis Wo  Contrast  05/13/2015   CLINICAL DATA:  Back pain.  EXAM: CT ABDOMEN AND PELVIS WITHOUT CONTRAST  TECHNIQUE: Multidetector CT imaging of the abdomen and pelvis was performed following the standard protocol without IV contrast.  COMPARISON:  None.  FINDINGS: Multilevel degenerative disc disease is noted in the lumbar spine. Moderate bilateral pleural effusions are noted.  Cholelithiasis is noted. No focal abnormality is noted in the liver or pancreas on these unenhanced images. Multiple splenic granulomata are noted. Adrenal glands appear normal. No hydronephrosis or renal obstruction is noted. 3.5 cm left renal cyst is noted. Atherosclerosis of abdominal aorta is noted without aneurysm formation. No renal or ureteral calculi are noted. There is no evidence of bowel obstruction. Status post hysterectomy. Urinary bladder appears normal. 6.7 x 5.2 cm simple left adnexal cyst is noted. No significant adenopathy is noted.  IMPRESSION: Multilevel degenerative disc disease is noted in the lower lumbar spine.  Moderate bilateral pleural effusions.  Cholelithiasis without definite evidence of cholecystitis.  Atherosclerosis of abdominal aorta without aneurysm formation.  No hydronephrosis or renal obstruction is noted. No renal or ureteral calculi are noted.  Status post hysterectomy.  6.7 cm simple left adnexal cyst is noted.   Electronically Signed   By: Lupita Raider, M.D.   On: 05/13/2015 16:14    Scheduled Meds: . donepezil  5 mg Oral QHS  . furosemide  20 mg Oral Daily  . lisinopril  2.5 mg Oral q morning - 10a  . loratadine  10 mg Oral Daily  . LORazepam  0.5 mg Oral TID  . memantine  10 mg Oral BID  . pantoprazole  40 mg Oral Daily  . piperacillin-tazobactam (ZOSYN)  IV  3.375 g Intravenous Q8H  . potassium chloride SA  20 mEq Oral Daily  . sodium chloride  3 mL Intravenous Q12H  . vancomycin  750 mg Intravenous Q24H   Continuous Infusions:   Principal Problem:   Pneumonia Active Problems:    Dementia   Chronic anticoagulation   Atrial fibrillation   FTT (failure to thrive) in adult   Hypokalemia   Acute on chronic respiratory failure   Acute on chronic diastolic CHF (congestive heart failure)    Time spent: 35 minutes  Erick Blinks, M.D. [facility] Pager [phone]. If 7PM-7AM, please contact night-coverage at [email or web address], password [facility] 05/14/2015, 7:44 AM  LOS: 3 days     I, Norg'e Tisdol, acting a scribe, recorded this note contemporaneously in the presence of Dr. Erick Blinks, M.D. on 05/14/2015 at 7:44 AM   I have reviewed the above documentation for accuracy and completeness, and I agree with the above.  Erick Blinks, MD

## 2015-05-14 NOTE — Clinical Social Work Note (Signed)
CSW met with pt's daughter and son to discuss d/c planning further. They are aware that [facility] is unable to offer a bed. Pt's daughter plans to visit [facility] this afternoon to see if this may be an option, but does not want clinicals faxed there until after her visit. They are aware of other options in and outside of the county, but feel it is important to be as close to home as possible so family can visit often. Daughter agreed to notify CSW after visiting [facility] today.   Benay Pike, [place]

## 2015-05-14 NOTE — Evaluation (Signed)
Clinical/Bedside Swallow Evaluation Patient Details  Name: Angela [name] MRN: [id number] Date of Birth: 12-03-1920  Today's Date: 05/14/2015 Time: SLP Start Time (ACUTE ONLY): 1800 SLP Stop Time (ACUTE ONLY): 1831 SLP Time Calculation (min) (ACUTE ONLY): 31 min  Past Medical History:  Past Medical History  Diagnosis Date  . ASCVD (arteriosclerotic cardiovascular disease)     acute MI in 1993;40%left main,80%LAD, 100%RCA-PTCA; EF of 40%;stress nuclear in2007; normal EF;  mixed inferolateral ischemia and infarction; CHF with normal EF in 2011  . CHF (congestive heart failure)     normal EF;mixed inferolateral ischemic and infarction;chf with normal EF in 2011  . Hypertension   . Hyperlipidemia   . Cerebrovascular disease     CVA by CT in 2007,but not noted in 2009;duplex in 12/2006 plaque without stenosis; Emergency Room evaluation in 12/2010 for transient aphasia and paresis-clopidogrel initially added to ASA Rx, but subsequently warfarin was substituted for these 2 agents  . Tobacco abuse, in remission     quit in 1995  . Claudication   . Kyphosis     severe  . Atrial fibrillation   . Chronic anticoagulation   . TIA (transient ischemic attack)   . Renal disorder   . Glass eye     right-1990  . HOH (hard of hearing)   . Wears glasses   . GERD (gastroesophageal reflux disease)   . Alzheimer's dementia   . COPD (chronic obstructive pulmonary disease)    Past Surgical History:  Past Surgical History  Procedure Laterality Date  . Abdominal hysterectomy    . Appendectomy    . Tonsillectomy    . Enucleation      Right-resulted from infection  . Hardware removal Left 08/16/2013    Procedure: REMOVAL 2 K WIRES FROM LEFT ANKLE. ;  Surgeon: Harvie Junior, MD;  Location: [facility];  Service: Orthopedics;  Laterality: Left;  REMOVAL 2 K WIRES FROM LEFT ANKLE.     HPI:  Angela [name] is a 81 yo woman who has a past medical history significant for HTN, HLD, CAD,  Aortic stenosis, diastolic CHF, is being brought to the hospital by her family after a sudden onset of malaise, fever, and tachypnea. Pt is a poor historian due to advanced demetia, history is obtained from daughter/ son. Pneumonia with tachypnea. Seems to be improving. Hemodynamically stable, oxygen stable. Will continue oxygen via [place] and Vanc and Zosyn for coverage. Will transition to oral abx tomorrow as she shows improvement. Acute on chronic respiratory failure with hypoxia. Pt uses oxygen prn at home. Will continue on supplemental oxygen for now as she recovers from PNA. Will continue on home dose of Lasix. COPD. Has some evidence of wheezing, but may be related to aspiration. Continue with bronchodilators and pulmonary hygiene. Possible aspiration. Will order swallow test and switch to soft food diet.   Assessment / Plan / Recommendation Clinical Impression  Angela [name] was seen at bedside with dinner meal with son and daughter present. Daughter provided background information and stated that pt lives with son and typically eats soft foods at home. Son reports that she expectorates foods that are stringy or difficult to masticate. Both deny coughing with food/liquids at home. Pt dislikes cold foods/beverages. Daughter held cup with straw for pt to drink from and audible swallows elicited. No overt coughing or throat clearing, however daughter encouraged to pinch the straw slightly and hold cup down towards her stomach to encourage head down position. SLP also encouraged  daughter to have pt self feed as much as possible with guidance from caregiver. Pt tolerated self presented cup sips thin with SLP support to elbow for elevation. Minimal po intake, however pt had no difficulties with puree; decreased rotary chewing and lingual manipulation noted with mech soft, however pt tolerated with small bites. SLP explained trajectory of dementia related to self feeding and swallowing and family receptive to  recommendations. Would continue diet as ordered: D3/mech soft with thin liquids; no straws. Encourage pt to self feed with support from caregiver as needed. Ensure pt with clear oral cavity and swallow 2x for each sip. Family in agreement with plan. Follow up at SNF for diet tolerance and pt/family education.    Aspiration Risk  Mild    Diet Recommendation Dysphagia 3 (Mech soft);Thin   Medication Administration: Whole meds with liquid Compensations: Slow rate;Small sips/bites;Check for pocketing;Multiple dry swallows after each bite/sip    Other  Recommendations Oral Care Recommendations: Oral care BID;Staff/trained caregiver to provide oral care Other Recommendations: Clarify dietary restrictions   Follow Up Recommendations  Skilled Nursing facility    Frequency and Duration        Pertinent Vitals/Pain VSS       Swallow Study Prior Functional Status   Lives at home with son.    General Date of Onset: 05/11/15 Other Pertinent Information: Angela [name] is a 81 yo woman who has a past medical history significant for HTN, HLD, CAD, Aortic stenosis, diastolic CHF, is being brought to the hospital by her family after a sudden onset of malaise, fever, and tachypnea. Pt is a poor historian due to advanced demetia, history is obtained from daughter/ son. Pneumonia with tachypnea. Seems to be improving. Hemodynamically stable, oxygen stable. Will continue oxygen via [place] and Vanc and Zosyn for coverage. Will transition to oral abx tomorrow as she shows improvement. Acute on chronic respiratory failure with hypoxia. Pt uses oxygen prn at home. Will continue on supplemental oxygen for now as she recovers from PNA. Will continue on home dose of Lasix. COPD. Has some evidence of wheezing, but may be related to aspiration. Continue with bronchodilators and pulmonary hygiene. Possible aspiration. Will order swallow test and switch to soft food diet. Type of Study: Bedside swallow  evaluation Previous Swallow Assessment: None on record Diet Prior to this Study: Dysphagia 3 (soft);Thin liquids Temperature Spikes Noted: No Respiratory Status: Supplemental O2 delivered via (comment) History of Recent Intubation: No Behavior/Cognition: Alert;Cooperative;Pleasant mood;Confused Oral Cavity - Dentition: Adequate natural dentition/normal for age Self-Feeding Abilities: Able to feed self;Needs set up Patient Positioning: Upright in bed Baseline Vocal Quality: Normal Volitional Cough: Weak Volitional Swallow: Able to elicit    Oral/Motor/Sensory Function Overall Oral Motor/Sensory Function: Appears within functional limits for tasks assessed (Pt had difficulty following directions due to [facility] and dementia) Labial ROM: Within Functional Limits Labial Symmetry: Within Functional Limits Labial Strength: Within Functional Limits Labial Sensation: Within Functional Limits Lingual ROM: Within Functional Limits Lingual Symmetry: Within Functional Limits Facial Symmetry: Within Functional Limits Velum: Within Functional Limits Mandible: Within Functional Limits   Ice Chips Ice chips: Not tested   Thin Liquid Thin Liquid: Impaired Presentation: Cup;Self Fed;Straw Oral Phase Impairments: Poor awareness of bolus Pharyngeal  Phase Impairments: Other (comments) (audible swallow with straw sips) Other Comments: Pt seemed to have difficulty controlling the amount with straw, did better with straw pinched and with cup sips (self presented with elbow support)    Nectar Thick Nectar Thick Liquid: Within functional limits Presentation:  Spoon   Honey Thick Honey Thick Liquid: Within functional limits Presentation: Spoon   Puree Puree: Within functional limits Presentation: Spoon   Solid    Thank you,  Havery Moros, CCC-SLP [phone]     Solid: Impaired Presentation: Spoon Oral Phase Impairments: Reduced lingual movement/coordination;Impaired anterior to posterior  transit Other Comments: decreased rotary mastication       Angela [name] 05/14/2015,6:51 PM

## 2015-05-15 ENCOUNTER — Inpatient Hospital Stay (HOSPITAL_COMMUNITY): Admission: RE | Admit: 2015-05-15 | Source: Hospice | Admitting: Internal Medicine

## 2015-05-15 ENCOUNTER — Inpatient Hospital Stay (HOSPITAL_COMMUNITY): Admit: 2015-05-15 | Payer: Self-pay

## 2015-05-15 LAB — BASIC METABOLIC PANEL
Anion gap: 8 (ref 5–15)
BUN: 15 mg/dL (ref 6–20)
CO2: 33 mmol/L — ABNORMAL HIGH (ref 22–32)
CREATININE: 1.28 mg/dL — AB (ref 0.44–1.00)
Calcium: 9 mg/dL (ref 8.9–10.3)
Chloride: 105 mmol/L (ref 101–111)
GFR calc Af Amer: 41 mL/min — ABNORMAL LOW (ref >60)
GFR, EST NON AFRICAN AMERICAN: 35 mL/min — AB (ref >60)
Glucose, Bld: 108 mg/dL — ABNORMAL HIGH (ref 65–99)
Potassium: 4.2 mmol/L (ref 3.5–5.1)
Sodium: 146 mmol/L — ABNORMAL HIGH (ref 135–145)

## 2015-05-15 LAB — PROTIME-INR
INR: 5.03 — AB (ref 0.00–1.49)
Prothrombin Time: 45.2 seconds — ABNORMAL HIGH (ref 11.6–15.2)

## 2015-05-15 MED ORDER — MORPHINE SULFATE (CONCENTRATE) 10 MG/0.5ML PO SOLN
10.0000 mg | ORAL | Status: DC | PRN
  Administered 2015-05-15 – 2015-05-16 (×3): 10 mg via ORAL
  Filled 2015-05-15 (×3): qty 0.5

## 2015-05-15 MED ORDER — WARFARIN - PHARMACIST DOSING INPATIENT: Status: DC

## 2015-05-15 MED ORDER — LORAZEPAM 0.5 MG PO TABS
0.5000 mg | ORAL_TABLET | Freq: Three times a day (TID) | ORAL | Status: DC
  Administered 2015-05-15 (×2): 0.5 mg via SUBLINGUAL
  Filled 2015-05-15 (×3): qty 1

## 2015-05-15 NOTE — Progress Notes (Signed)
ANTICOAGULATION CONSULT NOTE  Pharmacy Consult for coumadin Indication: atrial fibrillation  Allergies  Allergen Reactions  . Haldol [Haloperidol Decanoate]     Over sedation  . Sulfa Antibiotics Other (See Comments)    uknown   Patient Measurements: Height: 5\' 4"  (162.6 cm) Weight: 119 lb 4.3 oz (54.1 kg) IBW/kg (Calculated) : 54.7   Vital Signs: Temp: 98.7 F (37.1 C) (08/12 0016) Temp Source: Oral (08/12 0016) BP: 128/81 mmHg (08/12 0016) Pulse Rate: 93 (08/12 0016)  Labs:  Recent Labs  05/12/15 1615 05/13/15 0658 05/14/15 0644 05/15/15 0550  HGB 12.3  --  12.3  --   HCT 39.6  --  40.3  --   PLT 210  --  217  --   LABPROT 48.0* 44.8* 46.0* 45.2*  INR 5.39* 4.98* 5.15* 5.03*  CREATININE 0.97 0.98 1.10* 1.28*  TROPONINI <0.03  --   --   --     Estimated Creatinine Clearance: 24 mL/min (by C-G formula based on Cr of 1.28).  Medical History: Past Medical History  Diagnosis Date  . ASCVD (arteriosclerotic cardiovascular disease)     acute MI in 1993;40%left main,80%LAD, 100%RCA-PTCA; EF of 40%;stress nuclear in2007; normal EF;  mixed inferolateral ischemia and infarction; CHF with normal EF in 2011  . CHF (congestive heart failure)     normal EF;mixed inferolateral ischemic and infarction;chf with normal EF in 2011  . Hypertension   . Hyperlipidemia   . Cerebrovascular disease     CVA by CT in 2007,but not noted in 2009;duplex in 12/2006 plaque without stenosis; Emergency Room evaluation in 12/2010 for transient aphasia and paresis-clopidogrel initially added to ASA Rx, but subsequently warfarin was substituted for these 2 agents  . Tobacco abuse, in remission     quit in 1995  . Claudication   . Kyphosis     severe  . Atrial fibrillation   . Chronic anticoagulation   . TIA (transient ischemic attack)   . Renal disorder   . Glass eye     right-1990  . HOH (hard of hearing)   . Wears glasses   . GERD (gastroesophageal reflux disease)   . Alzheimer's  dementia   . COPD (chronic obstructive pulmonary disease)     Medications:  Prescriptions prior to admission  Medication Sig Dispense Refill Last Dose  . albuterol (PROVENTIL) (2.5 MG/3ML) 0.083% nebulizer solution Take 2.5 mg by nebulization every 4 (four) hours as needed. For shortness of breath/cough   unknown  . alendronate (FOSAMAX) 70 MG tablet Take 70 mg by mouth every 7 (seven) days. Take with a full glass of water on an empty stomach.    05/08/2015 at Unknown time  . cetirizine (ZYRTEC) 10 MG tablet Take 10 mg by mouth at bedtime.    05/10/2015 at Unknown time  . Cranberry-Cholecalciferol (SUPER CRANBERRY/VITAMIN D3) 4200-500 MG-UNIT CAPS Take 1 capsule by mouth every morning.   05/10/2015 at Unknown time  . Cyanocobalamin (VITAMIN B-12) 5000 MCG SUBL Place 5,000 mcg under the tongue every morning.    05/10/2015 at Unknown time  . docusate sodium (COLACE) 100 MG capsule Take 100 mg by mouth 2 (two) times daily.   05/10/2015 at Unknown time  . donepezil (ARICEPT) 5 MG tablet Take 5 mg by mouth at bedtime.   05/10/2015 at Unknown time  . feeding supplement, ENSURE ENLIVE, (ENSURE ENLIVE) LIQD Take 237 mLs by mouth 2 (two) times daily between meals. 237 mL 12 Past Month at Unknown time  . furosemide (LASIX)  40 MG tablet Take 0.5 tablets (20 mg total) by mouth as needed for fluid or edema. 30 tablet 0 unknown  . lisinopril (PRINIVIL,ZESTRIL) 2.5 MG tablet Take 2.5 mg by mouth every morning.    05/10/2015 at Unknown time  . LORazepam (ATIVAN) 0.5 MG tablet Take 1 tablet (0.5 mg total) by mouth every 12 (twelve) hours as needed for anxiety. Anxiety and or agitation. *Last dose is not to be given, if patient receives sleeping medication HALCION (TRIAZOLAM)* (Patient taking differently: Take 0.5 mg by mouth 3 (three) times daily as needed for anxiety. Anxiety and or agitation. *Last dose is not to be given, if patient receives sleeping medication HALCION (TRIAZOLAM)*) 30 tablet 0 05/10/2015 at Unknown time  .  memantine (NAMENDA) 10 MG tablet Take 10 mg by mouth 2 (two) times daily.    05/10/2015 at Unknown time  . Multiple Vitamins-Minerals (MULTIVITAMIN WITH MINERALS) tablet Take 1 tablet by mouth every morning.    05/10/2015 at Unknown time  . nitroGLYCERIN (NITROSTAT) 0.4 MG SL tablet Place 0.4 mg under the tongue every 5 (five) minutes as needed. For chest pain    unknown  . omeprazole (PRILOSEC) 40 MG capsule Take 40 mg by mouth daily.   05/10/2015 at Unknown time  . potassium chloride SA (K-DUR,KLOR-CON) 20 MEQ tablet Take 1 tablet (20 mEq total) by mouth daily. 1 tablet on days that lasix is taken   unknown  . warfarin (COUMADIN) 2.5 MG tablet Take 1 tablet daily except 1/2 tablet on Wednesdays (Patient taking differently: Take 1.25-2.5 mg by mouth See admin instructions. Take 1 tablet daily except 1/2 tablet twice weekly) 30 tablet 3 05/10/2015 at Unknown time   Assessment: 81 yo lady on coumadin for afib to continue.  INR today remains elevated  Goal of Therapy:  INR 2-3 Monitor platelets by anticoagulation protocol: Yes   Plan:  No coumadin today Check daily INR/PT.  Margo Aye, Raesean Bartoletti A 05/15/2015,8:19 AM

## 2015-05-15 NOTE — Progress Notes (Signed)
RN Lucrezia Europe and I admitted pt to a virtual bed under Hospice care. Plan will be for pt to transfer to the [facility] when bed becomes available. Hospice services/philosopy explained to pt.'s two daughters and pt.'s son Ivar Drape. Hospice forms read to and signed by son who is the pcg. Pt has a DNR on her chart. PCG is pt.'s POA. Pt will use [facility]. According to pt.'s family pt had decrease in appetite. Pt became dehydrated last weekend and was taken to the hospital. Pt was diagnosed with pneumonia and pt began IV fluids. Doctor talked with family today and informed them fluids were no longer benefiting the pt and made referral to hospice. Pt diagnosed with Alzheimers over 10 years ago. Pt was lying in bed sleeping when I entered her room. Pt has verbally responded to family prior to my arrival. [facility] financials discussed with family. Signs and Symptoms book given family and reviewed. Plan of care to visit 1-5 times a week to provide emotional support/reassurance and assess coping. Famly verbalizd agreement.

## 2015-05-15 NOTE — Care Management Important Message (Signed)
Important Message  Patient Details  Name: Angela [name] MRN: [id number] Date of Birth: 05-Nov-1920   Medicare Important Message Given:  Yes-second notification given    Malcolm Metro, RN 05/15/2015, 2:36 PM

## 2015-05-15 NOTE — Progress Notes (Signed)
1339 Patient's daughter reported to this writer her concerns for her mother taking morphine reporting that she and her sister are allergic to morphine and worry that the patient (their mother) may have a reaction as well. MD notified.

## 2015-05-15 NOTE — Care Management Note (Signed)
Case Management Note  Patient Details  Name: LATESHA CHESNEY MRN: [id number] Date of Birth: Dec 12, 1920  Expected Discharge Date:  05/14/15               Expected Discharge Plan:  Hospice Medical Facility  In-House Referral:  Clinical Social Work  Discharge planning Services  CM Consult  Post Acute Care Choice:  NA Choice offered to:  NA  DME Arranged:    DME Agency:     HH Arranged:    HH Agency:     Status of Service:  Completed, signed off  Medicare Important Message Given:    Date Medicare IM Given:    Medicare IM give by:    Date Additional Medicare IM Given:    Additional Medicare Important Message give by:     If discussed at Long Length of Stay Meetings, dates discussed:    Additional Comments: Pt's family have made the decision to admit the patient to hospice. CSW has made hospice referral. [facility] currently [facility] available, pt will remain in hospital until bed available and will be converted to GIP. No CM needs.  Malcolm Metro, RN 05/15/2015, 2:34 PM

## 2015-05-15 NOTE — Progress Notes (Signed)
[facility] PROGRESS NOTE  Angela [name] JWJ:[id number] DOB: 1921/02/20 DOA: 05/11/2015 PCP: Cassell Smiles., MD  Assessment/Plan: 1. Pneumonia with tachypnea.Clinically improved. Hemodynamically stable, oxygen stable.   2. Acute on chronic respiratory failure with hypoxia. Pt uses oxygen prn at home. Will continue on supplemental oxygen for now as she recovers from PNA. 3. COPD. Currently stable. Continue with bronchodilators and pulmonary hygiene.  4. Possible aspiration. Seen by speech therapy.  Swallow test shows dysphagia 3.  Recommend whole meds with liquid, oral care BID 5. Fall. Family reports pt fell at home prior to admission and has complained of right lower abdominal and hip pain upon movement. Tylenol ordered q4-6 hours for pain management. CT A&P reveals DDD in the lower L-spine, cholelithiasis without cholecystitis, atherosclerosis of abdominal aorta, and 6.7 cm simple left adnexal cyst. Evaluated by PT today. Recommend therapy prior to discharge and SNF;Supervision for mobility/OOB;Supervision/Assistance - 24 hour. Placed on fall precautions. 6. Abdominal pain. CT scan of abdomen and pelvis showed no acute findings. Continue pain management.  7. Advanced Alzheimer's dementia.  8. Failure to thrive. Pt. Has been eating 5 - 10% of her meals every day.  Discussed with daughter concerns that pt. Is not eating enough to sustain life, and encouraged pursuing comfort measures.  Will consult with palliative care and recommend Hospice. 9. Acute on Chronic diastolic congestive heart failure. Last echocardiogram on file from May 2016 with normal EF. CXR done on 05/11/15 reveals possible pulmonary edema vs conslidation. Exam suggestive of PNA although she does have evidence of volume overload. Will hold Lasix due to increased creatinine. 10. Hypokalemia. Resolved with potassium repletion.  11. Atrial fibrillation. On warfarin as outpatient, but will hold warfarin until she trends  down, as INR has increased with antibiotics, 5.03 today. No obvious evidence of bleeding. 12. 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 The wound is to the anterior aspect of the extremity. Evaluated today by [facility], will continue to use pressure redistribution chair pad and heel floatation for pressure injury prevention..   Discussion:  Patient's care was discussed with her daughter.  We reviewed past history, comorbidities, lack of progression and future prognosis.  Her PO intake has been minimal.  She appears to be more lethargic today.  Her long-term prognosis appears to be poor.  I have recommended palliative care/Hospice for end of life care.  Her daughter is agreeable to meet with Palliative Care later today.  I suspect she would be an appropriate candidate for residential Hospice.  Will simplify her medication regimen to include medications directly related to her comfort.  Code Status: DNR/DNI DVT prophylaxis: on coumadin Family Communication: family (daughters and son) is at bedside. Case discussed in detail, no concerns at this time.  Disposition Plan: SNF when improved.     Consultants:  PT, recommends therapy prior to discharge   WOC  Nutrition   Hospice  LCSW  Procedures:    Antibiotics: 13. Vancomycin 8/9>>8/12 14. Zosyn 8/9 >> 8/12  HPI/Subjective: Daughter reports needs to wash glass eye, but PT screams when they try to do so.  Sts hard to get pt to eat.  Seems worse today, less alert and has not bn able to feed.  Fed pt all day, [employer] from home yesterday.  Moans as if in pain; this is not usual.    Objective: Filed Vitals:   05/15/15 0016  BP: 128/81  Pulse: 93  Temp: 98.7 F (37.1 C)  Resp: 15    Intake/Output Summary (Last 24 hours) at  05/15/15 0825 Last data filed at 05/15/15 4098  Gross per 24 hour  Intake   1240 ml  Output      0 ml  Net   1240 ml   Filed Weights   05/11/15 1130 05/11/15 1556 05/13/15 0633  Weight: 53.978 kg (119 lb) 53.978 kg (119  lb) 54.1 kg (119 lb 4.3 oz)    Exam:  NAD. VSS. General:  Appears comfortable, calm, asleep. Cardiovascular: RRR. 2-3/6 systolic ejection murmur. 1+ lower extremity edema. Respiratory: Coarse breath sounds bilaterally, diminished at bases. Normal respiratory effort. Abdomen: soft, TTP in the RLQ. Bowel sounds are active.   Musculoskeletal: grossly normal tone bilateral upper and lower extremities Neurologic: grossly non-focal.  Data Reviewed: Basic Metabolic Panel:  Recent Labs Lab 05/11/15 1155 05/12/15 1615 05/13/15 0658 05/14/15 0644 05/15/15 0550  NA 144 141 145 143 146*  K 2.2* 2.6* 3.1* 2.8* 4.2  CL 101 100* 100* 101 105  CO2 35* 32 35* 36* 33*  GLUCOSE 104* 93 108* 98 108*  BUN 11 11 11 12 15   CREATININE 0.97 0.97 0.98 1.10* 1.28*  CALCIUM 8.9 8.0* 8.6* 8.3* 9.0  MG  --   --  2.1  --   --    Liver Function Tests:  Recent Labs Lab 05/11/15 1155  AST 18  ALT 12*  ALKPHOS 77  BILITOT 0.9  PROT 6.2*  ALBUMIN 3.4*   No results for input(s): LIPASE, AMYLASE in the last 168 hours. No results for input(s): AMMONIA in the last 168 hours. CBC:  Recent Labs Lab 05/11/15 1155 05/12/15 1615 05/14/15 0644  WBC 11.2* 11.1* 9.7  HGB 13.8 12.3 12.3  HCT 43.7 39.6 40.3  MCV 91.4 91.9 93.3  PLT 230 210 217   Cardiac Enzymes:  Recent Labs Lab 05/11/15 1155 05/12/15 1615  TROPONINI 0.04* <0.03   BNP (last 3 results)  Recent Labs  05/11/15 1207  BNP 399.0*    ProBNP (last 3 results) No results for input(s): PROBNP in the last 8760 hours.  CBG: No results for input(s): GLUCAP in the last 168 hours.  Recent Results (from the past 240 hour(s))  Urine culture     Status: None   Collection Time: 05/11/15 11:38 AM  Result Value Ref Range Status   Specimen Description URINE, CATHETERIZED  Final   Special Requests NONE  Final   Culture   Final    MULTIPLE SPECIES PRESENT, SUGGEST RECOLLECTION Performed at [facility]    Report Status  05/13/2015 FINAL  Final  Blood Culture (routine x 2)     Status: None (Preliminary result)   Collection Time: 05/11/15 11:55 AM  Result Value Ref Range Status   Specimen Description BLOOD RIGHT ARM DRAWN BY RN  Final   Special Requests BOTTLES DRAWN AEROBIC AND ANAEROBIC 4CC  Final   Culture NO GROWTH 3 DAYS  Final   Report Status PENDING  Incomplete  Blood Culture (routine x 2)     Status: None (Preliminary result)   Collection Time: 05/11/15 12:10 PM  Result Value Ref Range Status   Specimen Description BLOOD LEFT ANTECUBITAL  Final   Special Requests BOTTLES DRAWN AEROBIC AND ANAEROBIC 10CC  Final   Culture NO GROWTH 3 DAYS  Final   Report Status PENDING  Incomplete     Studies: Ct Abdomen Pelvis Wo Contrast  05/13/2015   CLINICAL DATA:  Back pain.  EXAM: CT ABDOMEN AND PELVIS WITHOUT CONTRAST  TECHNIQUE: Multidetector CT imaging of the abdomen  and pelvis was performed following the standard protocol without IV contrast.  COMPARISON:  None.  FINDINGS: Multilevel degenerative disc disease is noted in the lumbar spine. Moderate bilateral pleural effusions are noted.  Cholelithiasis is noted. No focal abnormality is noted in the liver or pancreas on these unenhanced images. Multiple splenic granulomata are noted. Adrenal glands appear normal. No hydronephrosis or renal obstruction is noted. 3.5 cm left renal cyst is noted. Atherosclerosis of abdominal aorta is noted without aneurysm formation. No renal or ureteral calculi are noted. There is no evidence of bowel obstruction. Status post hysterectomy. Urinary bladder appears normal. 6.7 x 5.2 cm simple left adnexal cyst is noted. No significant adenopathy is noted.  IMPRESSION: Multilevel degenerative disc disease is noted in the lower lumbar spine.  Moderate bilateral pleural effusions.  Cholelithiasis without definite evidence of cholecystitis.  Atherosclerosis of abdominal aorta without aneurysm formation.  No hydronephrosis or renal obstruction  is noted. No renal or ureteral calculi are noted.  Status post hysterectomy.  6.7 cm simple left adnexal cyst is noted.   Electronically Signed   By: Lupita Raider, M.D.   On: 05/13/2015 16:14    Scheduled Meds: . acetaminophen  650 mg Oral Q6H  . donepezil  5 mg Oral QHS  . ipratropium-albuterol  3 mL Nebulization TID  . lisinopril  2.5 mg Oral q morning - 10a  . loratadine  10 mg Oral Daily  . LORazepam  0.5 mg Oral TID  . memantine  10 mg Oral BID  . pantoprazole  40 mg Oral Daily  . piperacillin-tazobactam (ZOSYN)  IV  3.375 g Intravenous Q8H  . potassium chloride SA  20 mEq Oral Daily  . sodium chloride  3 mL Intravenous Q12H  . vancomycin  750 mg Intravenous Q24H  . Warfarin - Pharmacist Dosing Inpatient   Does not apply Q24H   Continuous Infusions:   Principal Problem:   Pneumonia Active Problems:   Dementia   Chronic anticoagulation   Atrial fibrillation   FTT (failure to thrive) in adult   Hypokalemia   Acute on chronic respiratory failure   Acute on chronic diastolic CHF (congestive heart failure)    Time spent: 40 minutes  Erick Blinks, M.D. [facility] Pager [phone]. If 7PM-7AM, please contact night-coverage at [email or web address], password [facility] 05/15/2015, 8:25 AM  LOS: 4 days     I, Lucretia Roers, acting a scribe, recorded this note contemporaneously in the presence of Dr. Erick Blinks, M.D. on 05/15/2015 at 8:25 AM   I have reviewed the above documentation for accuracy and completeness, and I agree with the above.  Erick Blinks, MD

## 2015-05-15 NOTE — Progress Notes (Signed)
1610 Lab called to report INR - 5.03 MD notified.

## 2015-05-15 NOTE — Clinical Social Work Note (Signed)
After discussion with MD, pt's daughter requests to take more comfort approach. Palliative care and hospice referrals made. Hospice to discuss GIP with daughter as no beds at [facility] at this time. Support provided.   Derenda Fennel, LCSW [phone]

## 2015-05-16 LAB — CULTURE, BLOOD (ROUTINE X 2)
Culture: NO GROWTH
Culture: NO GROWTH

## 2015-05-16 MED ORDER — LORAZEPAM 2 MG/ML IJ SOLN
0.5000 mg | Freq: Four times a day (QID) | INTRAMUSCULAR | Status: DC | PRN
  Administered 2015-05-16 – 2015-05-18 (×6): 0.5 mg via INTRAVENOUS
  Filled 2015-05-16 (×6): qty 1

## 2015-05-16 MED ORDER — MORPHINE SULFATE 2 MG/ML IJ SOLN
2.0000 mg | INTRAMUSCULAR | Status: DC | PRN
  Administered 2015-05-16 – 2015-05-17 (×6): 2 mg via INTRAVENOUS
  Filled 2015-05-16 (×6): qty 1

## 2015-05-16 NOTE — Progress Notes (Signed)
SN visit made. Pt lying in hospital bed with eyes closed. Did not respond during visit. Nurse states she recently medicated pt with ativan and morphine d/t increased agitation. Pt appears to be in no distress. VS taken.Marland KitchenMarland KitchenT-98.4, HR-96, BP-93/51, R-18 on room air. O2 sats 91%. Respirations even but shallow. LBM 8/12. Coolness noted to right knee. No mottling. Pt's 2 daughters are in the room at this time. All questions and concerns addressed. Informed daughters that pt was 2nd on the list for the [facility]. Understanding verbalized.

## 2015-05-16 NOTE — Progress Notes (Signed)
Patient refused to let RT obtain SpO2, listen to her lungs or take breathing treatment. She did swat at the therapist.

## 2015-05-16 NOTE — Progress Notes (Signed)
[facility] PROGRESS NOTE  Angela [name] HFS:[id number] DOB: [birth year] DOA: 05/11/2015 PCP: Glo Herring., MD  Assessment/Plan: 1. Pneumonia with tachypnea.Clinically improved. Hemodynamically stable, oxygen stable.   2. Acute on chronic respiratory failure with hypoxia. Pt uses oxygen prn at home. Will continue on supplemental oxygen for now as she recovers from PNA. 3. COPD. Pt is actively wheezing. Continue with bronchodilators and pulmonary hygiene.  4. Possible aspiration. Seen by speech therapy.  Swallow test shows dysphagia 3.  Recommend whole meds with liquid, oral care BID 5. Fall. Family reports pt fell at home prior to admission and has complained of right lower abdominal and hip pain upon movement. Tylenol ordered q4-6 hours for pain management. CT A&P reveals DDD in the lower L-spine, cholelithiasis without cholecystitis, atherosclerosis of abdominal aorta, and 6.7 cm simple left adnexal cyst. Evaluated by PT. Recommended therapy prior to discharge and SNF;Supervision for mobility/OOB;Supervision/Assistance - 24 hour. Placed on fall precautions. 6. Abdominal pain. CT scan of abdomen and pelvis showed no acute findings. Continue pain management.  7. Advanced Alzheimer's dementia.  8. Failure to thrive. Pt. Has been eating 5 - 10% of her meals every day.  Discussed with daughter concerns that pt. Is not eating enough to sustain life, and encouraged pursuing comfort measures.  Consulted with palliative care and recommend Hospice. Waiting on bed to become available at Hospice.  9. Acute on Chronic diastolic congestive heart failure. Last echocardiogram on file from May 2016 with normal EF. CXR done on 05/11/15 reveals possible pulmonary edema vs conslidation. Exam suggestive of PNA although she does have evidence of volume overload. Continue to hold Lasix due to increased creatinine. 10. Hypokalemia. Resolved with potassium repletion.  11. Atrial fibrillation. On warfarin as  outpatient, but will hold warfarin until she trends down, as INR has increased with antibiotics, 5.03 today. No obvious evidence of bleeding. 12. 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 The wound is to the anterior aspect of the extremity. Evaluated today by [facility], will continue to use pressure redistribution chair pad and heel floatation for pressure injury prevention..   Discussion:  Patient's care was discussed with her daughter. She met with hospice yesterday and agrees to pursue comfort care.  Plan will be to transition to residential hospice facility once a bed becomes available.    Code Status: DNR/DNI - comfort measures only  DVT prophylaxis: on coumadin Family Communication: family (daughters) at bedside. Case discussed in detail, no concerns at this time. 8/13 Disposition Plan: Hospice     Consultants:  PT, recommends therapy prior to discharge   [place]  LCSW  Procedures:    Antibiotics: Vancomycin 8/9>>8/12 Zosyn 8/9 >> 8/12  HPI/Subjective: Daughter reports her being very agitated earlier today.  Conversation limited due to pt condition.   Objective: Filed Vitals:   05/16/15 0657  BP: 127/72  Pulse: 71  Temp: 98.7 F (37.1 C)  Resp: 16    Intake/Output Summary (Last 24 hours) at 05/16/15 0750 Last data filed at 05/15/15 1700  Gross per 24 hour  Intake    180 ml  Output      0 ml  Net    180 ml   Filed Weights   05/11/15 1130 05/11/15 1556 05/13/15 0633  Weight: 53.978 kg (119 lb) 53.978 kg (119 lb) 54.1 kg (119 lb 4.3 oz)    Exam:   General:  Laying in bed, asleep/sedated, VSS, does not respond to voice or touch Cardiovascular: RRR. 2-3/3 systolic ejection murmur.  1+ LE edema. Respiratory: Bilateral wheezing, mostly in upper airways, diminished breath sounds throughout, mildly increased respiratory effort. Abdomen: soft, Bowel sounds are active.   Musculoskeletal: grossly normal tone bilateral UE and LE  Data Reviewed: Basic  Metabolic Panel:  Recent Labs Lab 05/11/15 1155 05/12/15 1615 05/13/15 0658 05/14/15 0644 05/15/15 0550  NA 144 141 145 143 146*  K 2.2* 2.6* 3.1* 2.8* 4.2  CL 101 100* 100* 101 105  CO2 35* 32 35* 36* 33*  GLUCOSE 104* 93 108* 98 108*  BUN $Re'11 11 11 12 15  'Qpv$ CREATININE 0.97 0.97 0.98 1.10* 1.28*  CALCIUM 8.9 8.0* 8.6* 8.3* 9.0  MG  --   --  2.1  --   --    Liver Function Tests:  Recent Labs Lab 05/11/15 1155  AST 18  ALT 12*  ALKPHOS 77  BILITOT 0.9  PROT 6.2*  ALBUMIN 3.4*   No results for input(s): LIPASE, AMYLASE in the last 168 hours. No results for input(s): AMMONIA in the last 168 hours. CBC:  Recent Labs Lab 05/11/15 1155 05/12/15 1615 05/14/15 0644  WBC 11.2* 11.1* 9.7  HGB 13.8 12.3 12.3  HCT 43.7 39.6 40.3  MCV 91.4 91.9 93.3  PLT 230 210 217   Cardiac Enzymes:  Recent Labs Lab 05/11/15 1155 05/12/15 1615  TROPONINI 0.04* <0.03   BNP (last 3 results)  Recent Labs  05/11/15 1207  BNP 399.0*    ProBNP (last 3 results) No results for input(s): PROBNP in the last 8760 hours.  CBG: No results for input(s): GLUCAP in the last 168 hours.  Recent Results (from the past 240 hour(s))  Urine culture     Status: None   Collection Time: 05/11/15 11:38 AM  Result Value Ref Range Status   Specimen Description URINE, CATHETERIZED  Final   Special Requests NONE  Final   Culture   Final    MULTIPLE SPECIES PRESENT, SUGGEST RECOLLECTION Performed at [facility]    Report Status 05/13/2015 FINAL  Final  Blood Culture (routine x 2)     Status: None (Preliminary result)   Collection Time: 05/11/15 11:55 AM  Result Value Ref Range Status   Specimen Description BLOOD RIGHT ARM DRAWN BY RN  Final   Special Requests BOTTLES DRAWN AEROBIC AND ANAEROBIC 4CC  Final   Culture NO GROWTH 3 DAYS  Final   Report Status PENDING  Incomplete  Blood Culture (routine x 2)     Status: None (Preliminary result)   Collection Time: 05/11/15 12:10 PM   Result Value Ref Range Status   Specimen Description BLOOD LEFT ANTECUBITAL  Final   Special Requests BOTTLES DRAWN AEROBIC AND ANAEROBIC 10CC  Final   Culture NO GROWTH 3 DAYS  Final   Report Status PENDING  Incomplete     Studies: No results found.  Scheduled Meds: . acetaminophen  650 mg Oral Q6H  . ipratropium-albuterol  3 mL Nebulization TID  . LORazepam  0.5 mg Sublingual TID  . sodium chloride  3 mL Intravenous Q12H   Continuous Infusions:   Principal Problem:   Pneumonia Active Problems:   Dementia   Chronic anticoagulation   Atrial fibrillation   FTT (failure to thrive) in adult   Hypokalemia   Acute on chronic respiratory failure   Acute on chronic diastolic CHF (congestive heart failure)    Time spent: 15 minutes  Kathie Dike, M.D. [facility] Pager [phone]. If 7PM-7AM, please contact night-coverage at [email or web address], password [facility] 05/16/2015, 7:50  AM  LOS: 5 days     I, Arielle Khosrowpour, acting a scribe, recorded this note contemporaneously in the presence of Dr. Kathie Dike, M.D. On 05/16/2015 at 7:51 AM  I have reviewed the above documentation for accuracy and completeness, and I agree with the above.  Kathie Dike, MD

## 2015-05-17 MED ORDER — HYDROMORPHONE HCL 1 MG/ML IJ SOLN
0.5000 mg | INTRAMUSCULAR | Status: DC | PRN
  Administered 2015-05-17: 1 mg via INTRAVENOUS
  Administered 2015-05-17: 0.5 mg via INTRAVENOUS
  Administered 2015-05-17: 1 mg via INTRAVENOUS
  Administered 2015-05-18 (×2): 0.5 mg via INTRAVENOUS
  Filled 2015-05-17 (×5): qty 1

## 2015-05-17 NOTE — Progress Notes (Signed)
[facility] PROGRESS NOTE  Angela [name] ELF:[id number] DOB: [birth year] DOA: 05/11/2015 PCP: Glo Herring., MD  Assessment/Plan:  Pneumonia with tachypnea.Clinically improved. Hemodynamically stable, oxygen stable.    Acute on chronic respiratory failure with hypoxia. Pt uses oxygen prn at home. Will continue on supplemental oxygen for now as she recovers from PNA.  COPD. Wheezing is improving. Continue with bronchodilators and pulmonary hygiene.   Possible aspiration. Seen by speech therapy.  Swallow test shows dysphagia 3.  Recommend whole meds with liquid, oral care BID  Fall. Family reports pt fell at home prior to admission and has complained of right lower abdominal and hip pain upon movement. Tylenol ordered q4-6 hours for pain management. CT A&P reveals DDD in the lower L-spine, cholelithiasis without cholecystitis, atherosclerosis of abdominal aorta, and 6.7 cm simple left adnexal cyst. Evaluated by PT. Recommended therapy prior to discharge and SNF;Supervision for mobility/OOB;Supervision/Assistance - 24 hour. Placed on fall precautions.  Abdominal pain. CT scan of abdomen and pelvis showed no acute findings. Continue pain management.   Advanced Alzheimer's dementia.   Failure to thrive. Pt. Has been eating 5 - 10% of her meals every day.  Discussed with daughter concerns that pt. Is not eating enough to sustain life, and encouraged pursuing comfort measures.  Consulted with palliative care and recommend Hospice. Waiting on bed to become available at Hospice.   Acute on Chronic diastolic congestive heart failure. Last echocardiogram on file from May 2016 with normal EF. CXR done on 05/11/15 reveals possible pulmonary edema vs conslidation. Exam suggestive of PNA although she does have evidence of volume overload. Continue to hold Lasix due to increased creatinine.  Hypokalemia. Resolved with potassium repletion.   Atrial fibrillation. On warfarin as outpatient,  but will hold warfarin until she trends down, as INR has increased with antibiotics, 5.03 today. No obvious evidence of bleeding.  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 The wound is to the anterior aspect of the extremity. Evaluated today by [place], will continue to use pressure redistribution chair pad and heel floatation for pressure injury prevention..   Discussion:  Patient's care was discussed with her daughter. She has met with hospice and agrees to pursue comfort care.  Plan will be to transition to residential hospice facility once a bed becomes available.  Continue current treatments.  Code Status: DNR/DNI - comfort measures only  DVT prophylaxis: on coumadin Family Communication: Daughter and son at bedside. Case discussed in detail, no concerns at this time. 8/14 Disposition Plan: Hospice once a bed is available.    Consultants:  PT, recommends therapy prior to discharge   [place]  Nutrition   Hospice  LCSW  Procedures:    Antibiotics:  Vancomycin 8/9>>8/12  Zosyn 8/9 >> 8/12  HPI/Subjective: Daughter reports that she has not been eating or drinking. She also has been extremely agitated and aggressive, screaming when she is moved and during breathing treatment. Wheezing has improved with breathing treatment. No reports of chest pain, shortness of breath, n/v/d, or abd pain.  Objective: Filed Vitals:   05/17/15 0543  BP: 129/98  Pulse: 125  Temp: 98.5 F (36.9 C)  Resp: 18    Intake/Output Summary (Last 24 hours) at 05/17/15 0644 Last data filed at 05/17/15 0544  Gross per 24 hour  Intake    200 ml  Output    150 ml  Net     50 ml   Filed Weights   05/11/15 1130 05/11/15 1556 05/13/15 0633  Weight: 53.978 kg (119 lb)  53.978 kg (119 lb) 54.1 kg (119 lb 4.3 oz)    Exam:  General: NAD, looks comfortable   Cardiovascular: Tachycardic regular rhythm.  Respiratory: Bilateral wheezes mostly in the upper airways  Abdomen: soft, non tender, no distention , bowel  sounds normal  Musculoskeletal: No edema b/l    Data Reviewed: Basic Metabolic Panel:  Recent Labs Lab 05/11/15 1155 05/12/15 1615 05/13/15 0658 05/14/15 0644 05/15/15 0550  NA 144 141 145 143 146*  K 2.2* 2.6* 3.1* 2.8* 4.2  CL 101 100* 100* 101 105  CO2 35* 32 35* 36* 33*  GLUCOSE 104* 93 108* 98 108*  BUN _0 CREATININE 0.97 0.97 0.98 1.10* 1.28*  CALCIUM 8.9 8.0* 8.6* 8.3* 9.0  MG  --   --  2.1  --   --    Liver Function Tests:  Recent Labs Lab 05/11/15 1155  AST 18  ALT 12*  ALKPHOS 77  BILITOT 0.9  PROT 6.2*  ALBUMIN 3.4*   No results for input(s): LIPASE, AMYLASE in the last 168 hours. No results for input(s): AMMONIA in the last 168 hours. CBC:  Recent Labs Lab 05/11/15 1155 05/12/15 1615 05/14/15 0644  WBC 11.2* 11.1* 9.7  HGB 13.8 12.3 12.3  HCT 43.7 39.6 40.3  MCV 91.4 91.9 93.3  PLT 230 210 217   Cardiac Enzymes:  Recent Labs Lab 05/11/15 1155 05/12/15 1615  TROPONINI 0.04* <0.03   BNP (last 3 results)  Recent Labs  05/11/15 1207  BNP 399.0*    ProBNP (last 3 results) No results for input(s): PROBNP in the last 8760 hours.  CBG: No results for input(s): GLUCAP in the last 168 hours.  Recent Results (from the past 240 hour(s))  Urine culture     Status: None   Collection Time: 05/11/15 11:38 AM  Result Value Ref Range Status   Specimen Description URINE, CATHETERIZED  Final   Special Requests NONE  Final   Culture   Final    MULTIPLE SPECIES PRESENT, SUGGEST RECOLLECTION Performed at [facility]    Report Status 05/13/2015 FINAL  Final  Blood Culture (routine x 2)     Status: None   Collection Time: 05/11/15 11:55 AM  Result Value Ref Range Status   Specimen Description BLOOD RIGHT ARM DRAWN BY RN  Final   Special Requests BOTTLES DRAWN AEROBIC AND ANAEROBIC 4CC  Final   Culture NO GROWTH 5 DAYS  Final   Report Status 05/16/2015 FINAL  Final  Blood Culture (routine x 2)     Status: None    Collection Time: 05/11/15 12:10 PM  Result Value Ref Range Status   Specimen Description BLOOD LEFT ANTECUBITAL  Final   Special Requests BOTTLES DRAWN AEROBIC AND ANAEROBIC 10CC  Final   Culture NO GROWTH 5 DAYS  Final   Report Status 05/16/2015 FINAL  Final     Studies: No results found.  Scheduled Meds: . ipratropium-albuterol  3 mL Nebulization TID  . sodium chloride  3 mL Intravenous Q12H   Continuous Infusions:   Principal Problem:   Pneumonia Active Problems:   Dementia   Chronic anticoagulation   Atrial fibrillation   FTT (failure to thrive) in adult   Hypokalemia   Acute on chronic respiratory failure   Acute on chronic diastolic CHF (congestive heart failure)    Time spent: 15 minutes  Kathie Dike, M.D. [facility] Pager [phone]. If 7PM-7AM, please contact night-coverage at [email or web address], password [facility] 05/17/2015, 6:44  AM  LOS: 6 days     I, Jessica D. Leonie Green, acting as scribe, recorded this note contemporaneously in the presence of Dr. Kathie Dike, M.D. on 05/17/2015.  I have reviewed the above documentation for accuracy and completeness, and I agree with the above.  Kathie Dike, MD

## 2015-05-17 NOTE — Progress Notes (Signed)
SN visit made. Pt lying in hospital bed with eyes closed. Did not respond during visit. Nurse states she recently medicated pt with ativan and dilaudid d/t increased agitation and facial grimacing. Pt appears to be in no distress at this time. VS taken.Marland KitchenMarland KitchenT-98.9, HR-96, BP-95/62, R-18 on 2L. O2 sats 97%. Respirations even but shallow. Coolness and mottling noted to bilateral knees.  Pt's son is in the room at this time. All questions and concerns addressed. Informed son that pt was 1st on the list for the Hospice Home. Understanding verbalized.

## 2015-05-17 NOTE — Progress Notes (Signed)
1337 Patient noted groaning, moaning and yelling out. Facial grimacing noted. Dilaudid 0.5mg  IV push given as ordered PRN and appears to be more effective than the previous ordered Morphine. Patient is currently resting quietly in bed with her eyes closed, no s/s of distress noted at this time. Patient's son at bedside.

## 2015-05-18 DIAGNOSIS — J9622 Acute and chronic respiratory failure with hypercapnia: Secondary | ICD-10-CM

## 2015-05-18 MED ORDER — HYDROMORPHONE HCL 1 MG/ML IJ SOLN: 0.5000 mg | INTRAMUSCULAR | Status: AC | PRN

## 2015-05-18 MED ORDER — LORAZEPAM 2 MG/ML IJ SOLN: 0.5000 mg | Freq: Four times a day (QID) | INTRAMUSCULAR | Status: AC | PRN

## 2015-05-18 NOTE — Progress Notes (Signed)
Hospice rep has called and indicated they have a bed for patient today. Dr Kerry Hough advised and awaiting final dc orders and paperwork for dc. Family updated- son, Ivar Drape, to meet patient at the hospice home- EMS to transport patient around 1 pm.    Reece Levy, MSW, Theresia Majors  [phone]

## 2015-05-18 NOTE — Progress Notes (Signed)
Pt transported via [facility] to [facility]. Per hospice nurse request IV catheter to remain in place.

## 2015-05-18 NOTE — H&P (Signed)
[facility] History and Physical  Angela [name] ZOX:[id number] DOB: 07-28-1921 DOA: 05/11/2015  PCP: Cassell Smiles., MD   Chief Complaint: admission for hospice  HPI: Angela [name] is a 81 y.o. female was admitted to the hospital with pneumonia and shortness of breath. She was treated in the hospital with antibiotic's overall felt improved. Her by mouth intake has been minimal. She has advanced dementia and is failing to thrive. After discussion with the family it was determined to pursue comfort measures and hospice care. She was seen by hospice and accepted for residential hospice facility. She is currently awaiting a bed and has been made GI P status.   Review of Systems:  Could not accurately assess due to dementia  Past Medical History  Diagnosis Date  . ASCVD (arteriosclerotic cardiovascular disease)     acute MI in 1993;40%left main,80%LAD, 100%RCA-PTCA; EF of 40%;stress nuclear in2007; normal EF;  mixed inferolateral ischemia and infarction; CHF with normal EF in 2011  . CHF (congestive heart failure)     normal EF;mixed inferolateral ischemic and infarction;chf with normal EF in 2011  . Hypertension   . Hyperlipidemia   . Cerebrovascular disease     CVA by CT in 2007,but not noted in 2009;duplex in 12/2006 plaque without stenosis; Emergency Room evaluation in 12/2010 for transient aphasia and paresis-clopidogrel initially added to ASA Rx, but subsequently warfarin was substituted for these 2 agents  . Tobacco abuse, in remission     quit in 1995  . Claudication   . Kyphosis     severe  . Atrial fibrillation   . Chronic anticoagulation   . TIA (transient ischemic attack)   . Renal disorder   . Glass eye     right-1990  . HOH (hard of hearing)   . Wears glasses   . GERD (gastroesophageal reflux disease)   . Alzheimer's dementia   . COPD (chronic obstructive pulmonary disease)    Past Surgical History  Procedure Laterality Date  . Abdominal hysterectomy    .  Appendectomy    . Tonsillectomy    . Enucleation      Right-resulted from infection  . Hardware removal Left 08/16/2013    Procedure: REMOVAL 2 K WIRES FROM LEFT ANKLE. ;  Surgeon: Harvie Junior, MD;  Location: [facility];  Service: Orthopedics;  Laterality: Left;  REMOVAL 2 K WIRES FROM LEFT ANKLE.     Social History:  reports that she quit smoking about 21 years ago. She started smoking about 60 years ago. She has never used smokeless tobacco. She reports that she does not drink alcohol or use illicit drugs.  Allergies  Allergen Reactions  . Haldol [Haloperidol Decanoate]     Over sedation  . Sulfa Antibiotics Other (See Comments)    uknown    Family History  Problem Relation Age of Onset  . Dementia Mother   . Atrial fibrillation Son   . Hypertension Son     Prior to Admission medications   Medication Sig Start Date End Date Taking? Authorizing Provider  albuterol (PROVENTIL) (2.5 MG/3ML) 0.083% nebulizer solution Take 2.5 mg by nebulization every 4 (four) hours as needed. For shortness of breath/cough 08/09/12  Yes Historical Provider, MD  alendronate (FOSAMAX) 70 MG tablet Take 70 mg by mouth every 7 (seven) days. Take with a full glass of water on an empty stomach.    Yes Historical Provider, MD  cetirizine (ZYRTEC) 10 MG tablet Take 10 mg by mouth at bedtime.  Yes Historical Provider, MD  Cranberry-Cholecalciferol (SUPER CRANBERRY/VITAMIN D3) 4200-500 MG-UNIT CAPS Take 1 capsule by mouth every morning.   Yes Historical Provider, MD  Cyanocobalamin (VITAMIN B-12) 5000 MCG SUBL Place 5,000 mcg under the tongue every morning.    Yes Historical Provider, MD  docusate sodium (COLACE) 100 MG capsule Take 100 mg by mouth 2 (two) times daily.   Yes Historical Provider, MD  donepezil (ARICEPT) 5 MG tablet Take 5 mg by mouth at bedtime.   Yes Historical Provider, MD  feeding supplement, ENSURE ENLIVE, (ENSURE ENLIVE) LIQD Take 237 mLs by mouth 2 (two) times daily between  meals. 02/18/15  Yes Lesle Chris Black, NP  furosemide (LASIX) 40 MG tablet Take 0.5 tablets (20 mg total) by mouth as needed for fluid or edema. 02/18/15  Yes Lesle Chris Black, NP  lisinopril (PRINIVIL,ZESTRIL) 2.5 MG tablet Take 2.5 mg by mouth every morning.    Yes Historical Provider, MD  LORazepam (ATIVAN) 0.5 MG tablet Take 1 tablet (0.5 mg total) by mouth every 12 (twelve) hours as needed for anxiety. Anxiety and or agitation. *Last dose is not to be given, if patient receives sleeping medication HALCION (TRIAZOLAM)* Patient taking differently: Take 0.5 mg by mouth 3 (three) times daily as needed for anxiety. Anxiety and or agitation. *Last dose is not to be given, if patient receives sleeping medication HALCION (TRIAZOLAM)* 02/18/15  Yes Lesle Chris Black, NP  memantine (NAMENDA) 10 MG tablet Take 10 mg by mouth 2 (two) times daily.    Yes Historical Provider, MD  Multiple Vitamins-Minerals (MULTIVITAMIN WITH MINERALS) tablet Take 1 tablet by mouth every morning.    Yes Historical Provider, MD  nitroGLYCERIN (NITROSTAT) 0.4 MG SL tablet Place 0.4 mg under the tongue every 5 (five) minutes as needed. For chest pain  05/25/11  Yes Jodelle Gross, NP  omeprazole (PRILOSEC) 40 MG capsule Take 40 mg by mouth daily.   Yes Historical Provider, MD  potassium chloride SA (K-DUR,KLOR-CON) 20 MEQ tablet Take 1 tablet (20 mEq total) by mouth daily. 1 tablet on days that lasix is taken 02/18/15  Yes Gwenyth Bender, NP  warfarin (COUMADIN) 2.5 MG tablet Take 1 tablet daily except 1/2 tablet on Wednesdays Patient taking differently: Take 1.25-2.5 mg by mouth See admin instructions. Take 1 tablet daily except 1/2 tablet twice weekly 04/13/15  Yes Laqueta Linden, MD  HYDROmorphone (DILAUDID) 1 MG/ML injection Inject 0.5-1 mLs (0.5-1 mg total) into the vein every 2 (two) hours as needed for severe pain. 05/18/15   Erick Blinks, MD  LORazepam (ATIVAN) 2 MG/ML injection Inject 0.25 mLs (0.5 mg total) into the vein every 6  (six) hours as needed for anxiety. 05/18/15   Erick Blinks, MD   Physical Exam: Filed Vitals:   05/17/15 2216 05/18/15 0342 05/18/15 0650 05/18/15 0725  BP: 152/89  143/85   Pulse: 104  100   Temp: 100.2 F (37.9 C)  99 F (37.2 C)   TempSrc: Axillary  Axillary   Resp: 16  19   Height:      Weight:      SpO2: 99% 95% 96% 85%    Wt Readings from Last 3 Encounters:  05/13/15 54.1 kg (119 lb 4.3 oz)  02/16/15 54 kg (119 lb 0.8 oz)  06/21/14 54.432 kg (120 lb)    General:  Appears calm and comfortable ENT: grossly normal hearing, lips & tongue Neck: no LAD, masses or thyromegaly Cardiovascular: RRR, no m/r/g. No LE edema. Telemetry: SR,  no arrhythmias  Respiratory: bilateral wheezing. Normal respiratory effort. Abdomen: soft, ntnd Skin: no rash or induration seen on limited exam Musculoskeletal: grossly normal tone BUE/BLE Psychiatric: dementia Neurologic: could not assess due to dementia          Labs on Admission:  Basic Metabolic Panel:  Recent Labs Lab 05/12/15 1615 05/13/15 0658 05/14/15 0644 05/15/15 0550  NA 141 145 143 146*  K 2.6* 3.1* 2.8* 4.2  CL 100* 100* 101 105  CO2 32 35* 36* 33*  GLUCOSE 93 108* 98 108*  BUN 11 11 12 15   CREATININE 0.97 0.98 1.10* 1.28*  CALCIUM 8.0* 8.6* 8.3* 9.0  MG  --  2.1  --   --    Liver Function Tests: No results for input(s): AST, ALT, ALKPHOS, BILITOT, PROT, ALBUMIN in the last 168 hours. No results for input(s): LIPASE, AMYLASE in the last 168 hours. No results for input(s): AMMONIA in the last 168 hours. CBC:  Recent Labs Lab 05/12/15 1615 05/14/15 0644  WBC 11.1* 9.7  HGB 12.3 12.3  HCT 39.6 40.3  MCV 91.9 93.3  PLT 210 217   Cardiac Enzymes:  Recent Labs Lab 05/12/15 1615  TROPONINI <0.03    BNP (last 3 results)  Recent Labs  05/11/15 1207  BNP 399.0*    ProBNP (last 3 results) No results for input(s): PROBNP in the last 8760 hours.  CBG: No results for input(s): GLUCAP in the last  168 hours.  Radiological Exams on Admission: No results found.  Assessment/Plan Principal Problem:   Pneumonia Active Problems:   Dementia   Chronic anticoagulation   Atrial fibrillation   FTT (failure to thrive) in adult   Hypokalemia   Acute on chronic respiratory failure   Acute on chronic diastolic CHF (congestive heart failure)   1. Acute on chronic respiratory failure related to pneumonia and acute on chronic diastolic congestive heart failure. Patient was treated with antibiotic's but has failed to improve. She does show evidence of congestive heart failure, but has worsening creatinine/hypernatremia when receiving Lasix. 2. Failure to thrive. Patient's by mouth intake has been minimal likely related to her advanced dementia and acute illness. 3. We will provide supportive treatment for the patient with symptom management. Hospice will follow the patient while in the hospital. She'll be transferred to a residential hospice facility once a bed is available. She said her GIP status.  Code Status: DO NOT RESUSCITATE, comfort measures Family Communication: Discussed with daughter Disposition Plan: Discharge to residential hospice once bed is available  Time spent:  [facility] Pager [phone]

## 2015-05-18 NOTE — Discharge Summary (Signed)
Physician Discharge Summary  Angela [name]:[id number] DOB: [birth year] DOA: 05/11/2015  PCP: Cassell Smiles., MD  Admit date: 05/11/2015 Discharge date: 05/18/2015  Time spent: 35 minutes  Recommendations for Outpatient Follow-up:  1. PT being d/c to residential Hospice for end of life care.    Discharge Diagnoses:  Principal Problem:   Pneumonia Active Problems:   Dementia   Chronic anticoagulation   Atrial fibrillation   FTT (failure to thrive) in adult   Hypokalemia   Acute on chronic respiratory failure   Acute on chronic diastolic CHF (congestive heart failure)   Discharge Condition: Terminal  Diet recommendation: D3/Mechanical Soft  Filed Weights   05/11/15 1130 05/11/15 1556 05/13/15 0633  Weight: 53.978 kg (119 lb) 53.978 kg (119 lb) 54.1 kg (119 lb 4.3 oz)    History of present illness:  Angela [name] that was admitted to the hospital for suspected PNA and worsening SOB, which failed to improve during her hospital course.  She was evaluated by Hospice services and felt to be appropriate for residential Hospice.  She was subsequently d/c and readmitted under GIP status.    Hospital Course:  1. Pt was treated in the hospital under GIP status.  Her pain was treated with IV narcotics and she received IV Ativan for agitation.  She has advanced dementia and her PO intake has remained minimal.  She is increasingly somnolent/irritable.  She continues to remain appropriate for residential Hospice.  She will be transferred to Hospice later today.  Consultants:    Procedures:    Antibiotics:    Discharge Exam: Filed Vitals:   05/18/15 0650  BP: 143/85  Pulse: 100  Temp: 99 F (37.2 C)  Resp: 19     General: Asleep when arrived in room, but wakes up to voice.  Cardiovascular: Tachycardic regular rhythm.  Respiratory: CTAB.  Discharge Instructions    Current Discharge Medication List    START taking these medications   Details  HYDROmorphone  (DILAUDID) 1 MG/ML injection Inject 0.5-1 mLs (0.5-1 mg total) into the vein every 2 (two) hours as needed for severe pain. Qty: 1 mL, Refills: 0    LORazepam (ATIVAN) 2 MG/ML injection Inject 0.25 mLs (0.5 mg total) into the vein every 6 (six) hours as needed for anxiety. Qty: 1 mL, Refills: 0      CONTINUE these medications which have NOT CHANGED   Details  albuterol (PROVENTIL) (2.5 MG/3ML) 0.083% nebulizer solution Take 2.5 mg by nebulization every 4 (four) hours as needed. For shortness of breath/cough      STOP taking these medications     alendronate (FOSAMAX) 70 MG tablet      cetirizine (ZYRTEC) 10 MG tablet      Cranberry-Cholecalciferol (SUPER CRANBERRY/VITAMIN D3) 4200-500 MG-UNIT CAPS      Cyanocobalamin (VITAMIN B-12) 5000 MCG SUBL      docusate sodium (COLACE) 100 MG capsule      donepezil (ARICEPT) 5 MG tablet      feeding supplement, ENSURE ENLIVE, (ENSURE ENLIVE) LIQD      furosemide (LASIX) 40 MG tablet      lisinopril (PRINIVIL,ZESTRIL) 2.5 MG tablet      LORazepam (ATIVAN) 0.5 MG tablet      memantine (NAMENDA) 10 MG tablet      Multiple Vitamins-Minerals (MULTIVITAMIN WITH MINERALS) tablet      nitroGLYCERIN (NITROSTAT) 0.4 MG SL tablet      omeprazole (PRILOSEC) 40 MG capsule      potassium chloride SA (K-DUR,KLOR-CON) 20  MEQ tablet      warfarin (COUMADIN) 2.5 MG tablet        Allergies  Allergen Reactions  . Haldol [Haloperidol Decanoate]     Over sedation  . Sulfa Antibiotics Other (See Comments)    uknown      The results of significant diagnostics from this hospitalization (including imaging, microbiology, ancillary and laboratory) are listed below for reference.    Significant Diagnostic Studies: Ct Abdomen Pelvis Wo Contrast  05/13/2015   CLINICAL DATA:  Back pain.  EXAM: CT ABDOMEN AND PELVIS WITHOUT CONTRAST  TECHNIQUE: Multidetector CT imaging of the abdomen and pelvis was performed following the standard protocol without  IV contrast.  COMPARISON:  None.  FINDINGS: Multilevel degenerative disc disease is noted in the lumbar spine. Moderate bilateral pleural effusions are noted.  Cholelithiasis is noted. No focal abnormality is noted in the liver or pancreas on these unenhanced images. Multiple splenic granulomata are noted. Adrenal glands appear normal. No hydronephrosis or renal obstruction is noted. 3.5 cm left renal cyst is noted. Atherosclerosis of abdominal aorta is noted without aneurysm formation. No renal or ureteral calculi are noted. There is no evidence of bowel obstruction. Status post hysterectomy. Urinary bladder appears normal. 6.7 x 5.2 cm simple left adnexal cyst is noted. No significant adenopathy is noted.  IMPRESSION: Multilevel degenerative disc disease is noted in the lower lumbar spine.  Moderate bilateral pleural effusions.  Cholelithiasis without definite evidence of cholecystitis.  Atherosclerosis of abdominal aorta without aneurysm formation.  No hydronephrosis or renal obstruction is noted. No renal or ureteral calculi are noted.  Status post hysterectomy.  6.7 cm simple left adnexal cyst is noted.   Electronically Signed   By: Lupita Raider, M.D.   On: 05/13/2015 16:14   Ct Head Wo Contrast  05/11/2015   CLINICAL DATA:  81 year old [name] with lethargy. Subsequent encounter.  EXAM: CT HEAD WITHOUT CONTRAST  TECHNIQUE: Contiguous axial images were obtained from the base of the skull through the vertex without intravenous contrast.  COMPARISON:  02/15/2015.  FINDINGS: Exam is motion degraded.  No intracranial hemorrhage.  Remote left medial temporal lobe infarct.  Prominent small vessel disease type changes without CT evidence of large acute infarct.  Global atrophy without hydrocephalus.  Prominent falx and tentorial calcifications.  Vascular calcifications.  Post right orbital exoneration.  No calvarial abnormality. Mastoid air cells, middle ear cavities and visualized paranasal sinuses are clear.   IMPRESSION: Exam is motion degraded.  No intracranial hemorrhage.  Remote left medial temporal lobe infarct.  Prominent small vessel disease type changes without CT evidence of large acute infarct.  Global atrophy without hydrocephalus.   Electronically Signed   By: Angela [name] Duverney M.D.   On: 05/11/2015 13:16   Dg Chest Portable 1 View  05/11/2015   CLINICAL DATA:  81 year old [name] post fall. Lethargic. Initial encounter.  EXAM: PORTABLE CHEST - 1 VIEW  COMPARISON:  02/15/2015.  FINDINGS: Cardiomegaly.  Pulmonary vascular congestion/ pulmonary edema with pleural effusions suspected.  Basilar consolidation may represent pleural fluid and atelectasis although infiltrate or mass cannot be excluded.  No obvious pneumothorax or rib fracture.  Calcified tortuous aorta.  IMPRESSION: Cardiomegaly.  Pulmonary vascular congestion/ pulmonary edema with pleural effusions suspected.  Basilar consolidation may represent pleural fluid and atelectasis although infiltrate or mass cannot be excluded.   Electronically Signed   By: Angela [name] Duverney M.D.   On: 05/11/2015 12:20    Microbiology: Recent Results (from the past 240 hour(s))  Urine  culture     Status: None   Collection Time: 05/11/15 11:38 AM  Result Value Ref Range Status   Specimen Description URINE, CATHETERIZED  Final   Special Requests NONE  Final   Culture   Final    MULTIPLE SPECIES PRESENT, SUGGEST RECOLLECTION Performed at [facility]    Report Status 05/13/2015 FINAL  Final  Blood Culture (routine x 2)     Status: None   Collection Time: 05/11/15 11:55 AM  Result Value Ref Range Status   Specimen Description BLOOD RIGHT ARM DRAWN BY RN  Final   Special Requests BOTTLES DRAWN AEROBIC AND ANAEROBIC 4CC  Final   Culture NO GROWTH 5 DAYS  Final   Report Status 05/16/2015 FINAL  Final  Blood Culture (routine x 2)     Status: None   Collection Time: 05/11/15 12:10 PM  Result Value Ref Range Status   Specimen Description BLOOD LEFT  ANTECUBITAL  Final   Special Requests BOTTLES DRAWN AEROBIC AND ANAEROBIC 10CC  Final   Culture NO GROWTH 5 DAYS  Final   Report Status 05/16/2015 FINAL  Final     Labs: Basic Metabolic Panel:  Recent Labs Lab 05/11/15 1155 05/12/15 1615 05/13/15 0658 05/14/15 0644 05/15/15 0550  NA 144 141 145 143 146*  K 2.2* 2.6* 3.1* 2.8* 4.2  CL 101 100* 100* 101 105  CO2 35* 32 35* 36* 33*  GLUCOSE 104* 93 108* 98 108*  BUN CREATININE 0.97 0.97 0.98 1.10* 1.28*  CALCIUM 8.9 8.0* 8.6* 8.3* 9.0  MG  --   --  2.1  --   --    Liver Function Tests:  Recent Labs Lab 05/11/15 1155  AST 18  ALT 12*  ALKPHOS 77  BILITOT 0.9  PROT 6.2*  ALBUMIN 3.4*   No results for input(s): LIPASE, AMYLASE in the last 168 hours. No results for input(s): AMMONIA in the last 168 hours. CBC:  Recent Labs Lab 05/11/15 1155 05/12/15 1615 05/14/15 0644  WBC 11.2* 11.1* 9.7  HGB 13.8 12.3 12.3  HCT 43.7 39.6 40.3  MCV 91.4 91.9 93.3  PLT 230 210 217   Cardiac Enzymes:  Recent Labs Lab 05/11/15 1155 05/12/15 1615  TROPONINI 0.04* <0.03   BNP: BNP (last 3 results)  Recent Labs  05/11/15 1207  BNP 399.0*    ProBNP (last 3 results) No results for input(s): PROBNP in the last 8760 hours.  CBG: No results for input(s): GLUCAP in the last 168 hours.     Signed:  Erick Blinks, M.D. [facility] 05/18/2015, 11:29 AM   I, Lucretia Roers, acting a scribe, recorded this note contemporaneously in the presence of Dr. Erick Blinks, M.D. on 05/18/2015 at 11:29 AM.   I have reviewed the above documentation for accuracy and completeness, and I agree with the above.  Kenya Shiraishi

## 2015-05-18 NOTE — Discharge Summary (Signed)
Physician Discharge Summary  Angela [name] ZOX:[id number] DOB: 1921/03/23 DOA: 05/11/2015  PCP: Cassell Smiles., MD  Admit date: 05/11/2015 Discharge date: 05/18/2015  Time spent: 40 minutes  Recommendations for Outpatient Follow-up:  1. Admit patient to GIP status for comfort care until residential hospice bed available  Discharge Diagnoses:  Principal Problem:   Pneumonia Active Problems:   Dementia   Chronic anticoagulation   Atrial fibrillation   FTT (failure to thrive) in adult   Hypokalemia   Acute on chronic respiratory failure   Acute on chronic diastolic CHF (congestive heart failure)   Discharge Condition: terminal  Diet recommendation: regular diet for comfort  Filed Weights   05/11/15 1130 05/11/15 1556 05/13/15 0633  Weight: 53.978 kg (119 lb) 53.978 kg (119 lb) 54.1 kg (119 lb 4.3 oz)    History of present illness:  This patient was admitted to the hospital after suffering a fall. She was evaluated and found to have a possible pneumonia with hypoxia. She was admitted to the hospital for further treatments.  Hospital Course:  Patient was treated with intravenous antibiotics for pneumonia and did initially improve. She continued to have shortness of breath and imaging revealed bilateral pleural effusions. She was started on intravenous Lasix which caused her creatinine to rise and she developed hypernatremia. She continues to call my abdominal pain and low back pain. CT scan of the abdomen and pelvis did not show any acute findings. Her by mouth intake was minimal during her hospital stay at 5-10% of her meals. Her overall functional/nutritional status continued to decline and she became increasingly weak. After an honest discussion with the patient's daughter, it was determined that hospice/palliative care. Most appropriate course of action. She was evaluated by hospice who agreed to admit her to a residential hospice pending bed availability. She was transitioned to  GIP status until hospice bed is available . Focus of care was on the patient's comfort.  Procedures:    Consultations:  PT, recommends therapy prior to discharge   WOC  Nutrition   Hospice  LCSW  Discharge Exam: Filed Vitals:   05/18/15 0650  BP: 143/85  Pulse: 100  Temp: 99 F (37.2 C)  Resp: 19    General: NAD Cardiovascular: S1, S2 RR Respiratory: bilateral wheezes  Discharge Instructions   Discharge Instructions    Diet - low sodium heart healthy    Complete by:  As directed      Diet - low sodium heart healthy    Complete by:  As directed      Increase activity slowly    Complete by:  As directed      Increase activity slowly    Complete by:  As directed           Current Discharge Medication List    START taking these medications   Details  HYDROmorphone (DILAUDID) 1 MG/ML injection Inject 0.5-1 mLs (0.5-1 mg total) into the vein every 2 (two) hours as needed for severe pain. Qty: 1 mL, Refills: 0    LORazepam (ATIVAN) 2 MG/ML injection Inject 0.25 mLs (0.5 mg total) into the vein every 6 (six) hours as needed for anxiety. Qty: 1 mL, Refills: 0      CONTINUE these medications which have NOT CHANGED   Details  albuterol (PROVENTIL) (2.5 MG/3ML) 0.083% nebulizer solution Take 2.5 mg by nebulization every 4 (four) hours as needed. For shortness of breath/cough      STOP taking these medications     alendronate (  FOSAMAX) 70 MG tablet      cetirizine (ZYRTEC) 10 MG tablet      Cranberry-Cholecalciferol (SUPER CRANBERRY/VITAMIN D3) 4200-500 MG-UNIT CAPS      Cyanocobalamin (VITAMIN B-12) 5000 MCG SUBL      docusate sodium (COLACE) 100 MG capsule      donepezil (ARICEPT) 5 MG tablet      feeding supplement, ENSURE ENLIVE, (ENSURE ENLIVE) LIQD      furosemide (LASIX) 40 MG tablet      lisinopril (PRINIVIL,ZESTRIL) 2.5 MG tablet      LORazepam (ATIVAN) 0.5 MG tablet      memantine (NAMENDA) 10 MG tablet      Multiple  Vitamins-Minerals (MULTIVITAMIN WITH MINERALS) tablet      nitroGLYCERIN (NITROSTAT) 0.4 MG SL tablet      omeprazole (PRILOSEC) 40 MG capsule      potassium chloride SA (K-DUR,KLOR-CON) 20 MEQ tablet      warfarin (COUMADIN) 2.5 MG tablet        Allergies  Allergen Reactions  . Haldol [Haloperidol Decanoate]     Over sedation  . Sulfa Antibiotics Other (See Comments)    uknown      The results of significant diagnostics from this hospitalization (including imaging, microbiology, ancillary and laboratory) are listed below for reference.    Significant Diagnostic Studies: Ct Abdomen Pelvis Wo Contrast  05/13/2015   CLINICAL DATA:  Back pain.  EXAM: CT ABDOMEN AND PELVIS WITHOUT CONTRAST  TECHNIQUE: Multidetector CT imaging of the abdomen and pelvis was performed following the standard protocol without IV contrast.  COMPARISON:  None.  FINDINGS: Multilevel degenerative disc disease is noted in the lumbar spine. Moderate bilateral pleural effusions are noted.  Cholelithiasis is noted. No focal abnormality is noted in the liver or pancreas on these unenhanced images. Multiple splenic granulomata are noted. Adrenal glands appear normal. No hydronephrosis or renal obstruction is noted. 3.5 cm left renal cyst is noted. Atherosclerosis of abdominal aorta is noted without aneurysm formation. No renal or ureteral calculi are noted. There is no evidence of bowel obstruction. Status post hysterectomy. Urinary bladder appears normal. 6.7 x 5.2 cm simple left adnexal cyst is noted. No significant adenopathy is noted.  IMPRESSION: Multilevel degenerative disc disease is noted in the lower lumbar spine.  Moderate bilateral pleural effusions.  Cholelithiasis without definite evidence of cholecystitis.  Atherosclerosis of abdominal aorta without aneurysm formation.  No hydronephrosis or renal obstruction is noted. No renal or ureteral calculi are noted.  Status post hysterectomy.  6.7 cm simple left adnexal  cyst is noted.   Electronically Signed   By: Lupita Raider, M.D.   On: 05/13/2015 16:14   Ct Head Wo Contrast  05/11/2015   CLINICAL DATA:  81 year old female with lethargy. Subsequent encounter.  EXAM: CT HEAD WITHOUT CONTRAST  TECHNIQUE: Contiguous axial images were obtained from the base of the skull through the vertex without intravenous contrast.  COMPARISON:  02/15/2015.  FINDINGS: Exam is motion degraded.  No intracranial hemorrhage.  Remote left medial temporal lobe infarct.  Prominent small vessel disease type changes without CT evidence of large acute infarct.  Global atrophy without hydrocephalus.  Prominent falx and tentorial calcifications.  Vascular calcifications.  Post right orbital exoneration.  No calvarial abnormality. Mastoid air cells, middle ear cavities and visualized paranasal sinuses are clear.  IMPRESSION: Exam is motion degraded.  No intracranial hemorrhage.  Remote left medial temporal lobe infarct.  Prominent small vessel disease type changes without CT evidence of large acute  infarct.  Global atrophy without hydrocephalus.   Electronically Signed   By: Lacy Duverney M.D.   On: 05/11/2015 13:16   Dg Chest Portable 1 View  05/11/2015   CLINICAL DATA:  81 year old female post fall. Lethargic. Initial encounter.  EXAM: PORTABLE CHEST - 1 VIEW  COMPARISON:  02/15/2015.  FINDINGS: Cardiomegaly.  Pulmonary vascular congestion/ pulmonary edema with pleural effusions suspected.  Basilar consolidation may represent pleural fluid and atelectasis although infiltrate or mass cannot be excluded.  No obvious pneumothorax or rib fracture.  Calcified tortuous aorta.  IMPRESSION: Cardiomegaly.  Pulmonary vascular congestion/ pulmonary edema with pleural effusions suspected.  Basilar consolidation may represent pleural fluid and atelectasis although infiltrate or mass cannot be excluded.   Electronically Signed   By: Lacy Duverney M.D.   On: 05/11/2015 12:20    Microbiology: Recent Results (from  the past 240 hour(s))  Urine culture     Status: None   Collection Time: 05/11/15 11:38 AM  Result Value Ref Range Status   Specimen Description URINE, CATHETERIZED  Final   Special Requests NONE  Final   Culture   Final    MULTIPLE SPECIES PRESENT, SUGGEST RECOLLECTION Performed at [facility]    Report Status 05/13/2015 FINAL  Final  Blood Culture (routine x 2)     Status: None   Collection Time: 05/11/15 11:55 AM  Result Value Ref Range Status   Specimen Description BLOOD RIGHT ARM DRAWN BY RN  Final   Special Requests BOTTLES DRAWN AEROBIC AND ANAEROBIC 4CC  Final   Culture NO GROWTH 5 DAYS  Final   Report Status 05/16/2015 FINAL  Final  Blood Culture (routine x 2)     Status: None   Collection Time: 05/11/15 12:10 PM  Result Value Ref Range Status   Specimen Description BLOOD LEFT ANTECUBITAL  Final   Special Requests BOTTLES DRAWN AEROBIC AND ANAEROBIC 10CC  Final   Culture NO GROWTH 5 DAYS  Final   Report Status 05/16/2015 FINAL  Final     Labs: Basic Metabolic Panel:  Recent Labs Lab 05/12/15 1615 05/13/15 0658 05/14/15 0644 05/15/15 0550  NA 141 145 143 146*  K 2.6* 3.1* 2.8* 4.2  CL 100* 100* 101 105  CO2 32 35* 36* 33*  GLUCOSE 93 108* 98 108*  BUN CREATININE 0.97 0.98 1.10* 1.28*  CALCIUM 8.0* 8.6* 8.3* 9.0  MG  --  2.1  --   --    Liver Function Tests: No results for input(s): AST, ALT, ALKPHOS, BILITOT, PROT, ALBUMIN in the last 168 hours. No results for input(s): LIPASE, AMYLASE in the last 168 hours. No results for input(s): AMMONIA in the last 168 hours. CBC:  Recent Labs Lab 05/12/15 1615 05/14/15 0644  WBC 11.1* 9.7  HGB 12.3 12.3  HCT 39.6 40.3  MCV 91.9 93.3  PLT 210 217   Cardiac Enzymes:  Recent Labs Lab 05/12/15 1615  TROPONINI <0.03   BNP: BNP (last 3 results)  Recent Labs  05/11/15 1207  BNP 399.0*    ProBNP (last 3 results) No results for input(s): PROBNP in the last 8760 hours.  CBG: No  results for input(s): GLUCAP in the last 168 hours.     Signed:  MEMON,JEHANZEB  [facility] 05/18/2015, 12:48 PM

## 2015-05-18 NOTE — Care Management Note (Signed)
Case Management Note  Patient Details  Name: Angela [name] MRN: [id number] Date of Birth: [birth year]  Subjective/Objective:                    Action/Plan:   Expected Discharge Date:  05/14/15               Expected Discharge Plan:  Hospice Medical Facility  In-House Referral:  Clinical Social Work  Discharge planning Services  CM Consult  Post Acute Care Choice:  NA Choice offered to:  NA  DME Arranged:    DME Agency:     HH Arranged:    HH Agency:     Status of Service:  Completed, signed off  Medicare Important Message Given:  Yes-second notification given Date Medicare IM Given:    Medicare IM give by:    Date Additional Medicare IM Given:    Additional Medicare Important Message give by:     If discussed at Long Length of Stay Meetings, dates discussed:    Additional Comments: Pt changed to GIP on 05/15/15 once admitted by [facility]. Pt now has bed at [facility]. CSW to arrange discharge to facility. Arlyss Queen Steele, RN 05/18/2015, 11:10 AM

## 2015-05-18 NOTE — Progress Notes (Signed)
Patient has slept most of night , gave her blow by neb at 0340. Patient has advanced dementia and becomes combative when upset. Patient slept through treatment but wheezing increased with treatment mostly upper airway.

## 2015-05-18 NOTE — Progress Notes (Signed)
Pt has declined in medical status and is awaiting transfer to Hospice Home.  We will d/c from PT service.

## 2015-05-18 NOTE — Progress Notes (Signed)
Report given to [place], [facility]. Pt will be transported via [facility]. Pt in stable condition and in no acute distress at time of discharge.

## 2015-06-04 DEATH — deceased

## 2015-07-02 ENCOUNTER — Ambulatory Visit: Payer: Medicare Other | Admitting: Cardiovascular Disease

## 2016-01-28 IMAGING — CT CT HEAD W/O CM
4 series · 16 of 47 positions shown, 18 images · non-contrast
Comparison: Head CT, 02/06/2013

CLINICAL DATA: Fell today. Altered level of consciousness. Blood
from the left nostril.

EXAM:
CT HEAD WITHOUT CONTRAST
CT CERVICAL SPINE WITHOUT CONTRAST
TECHNIQUE: Multidetector CT imaging of the head and cervical spine was
performed following the standard protocol without intravenous
contrast. Multiplanar CT image reconstructions of the cervical spine
were also generated.

[Series 2: headseq 4.8 h37s · axial · 0.47mm/px · z∈[+172,+307]mm · 8 of 36 slices shown, 10 images]
[im 4/36  brain]
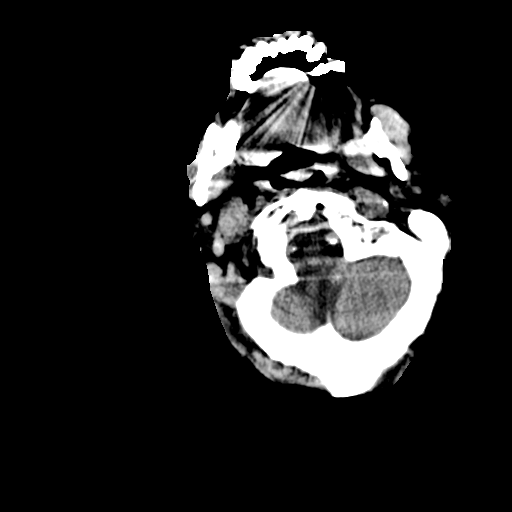
[im 4/36  bone]
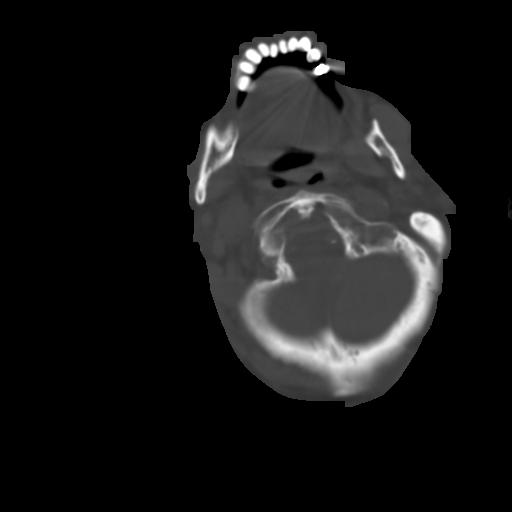
[im 8/36  brain]
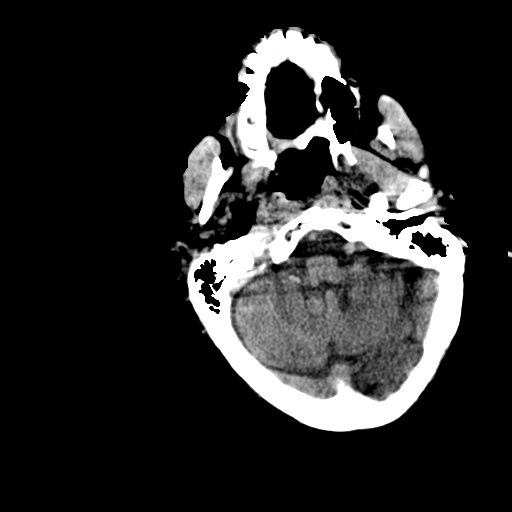
[im 12/36  brain]
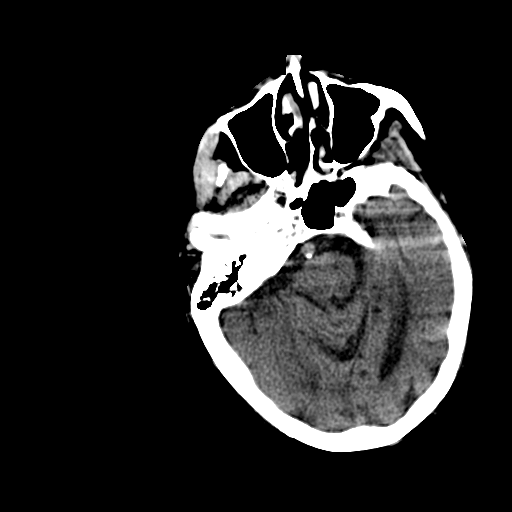
[im 16/36  brain]
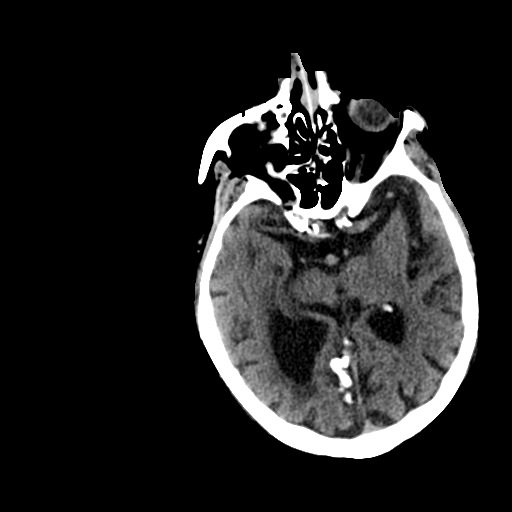
[im 20/36  brain]
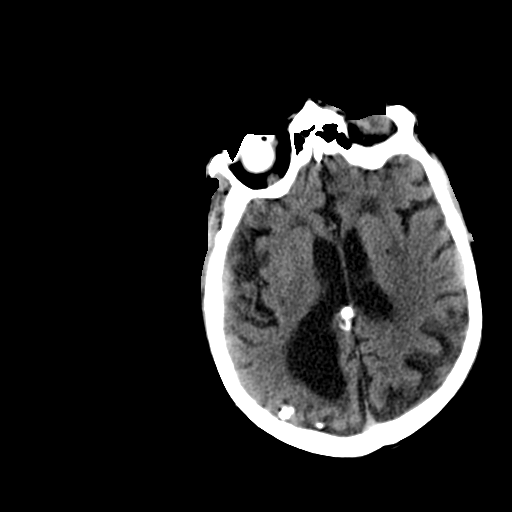
[im 20/36  bone]
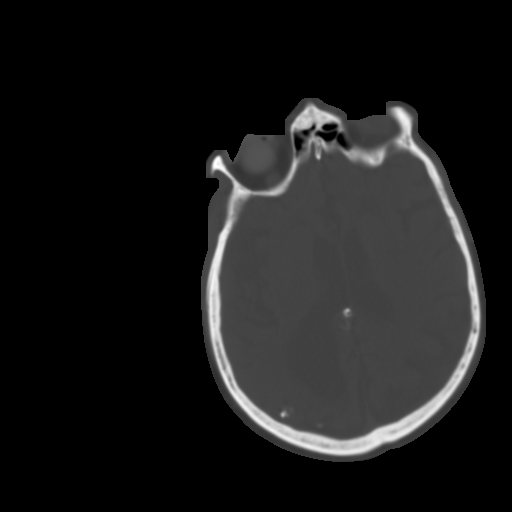
[im 24/36  brain]
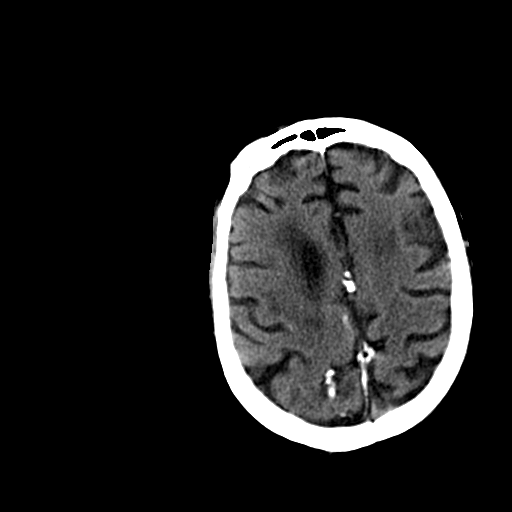
[im 28/36  brain]
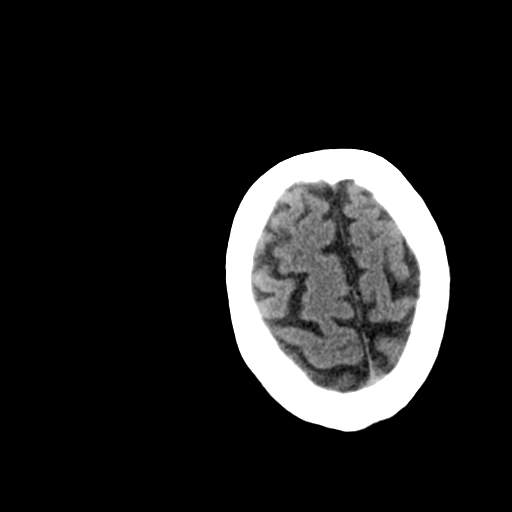
[im 32/36  brain]
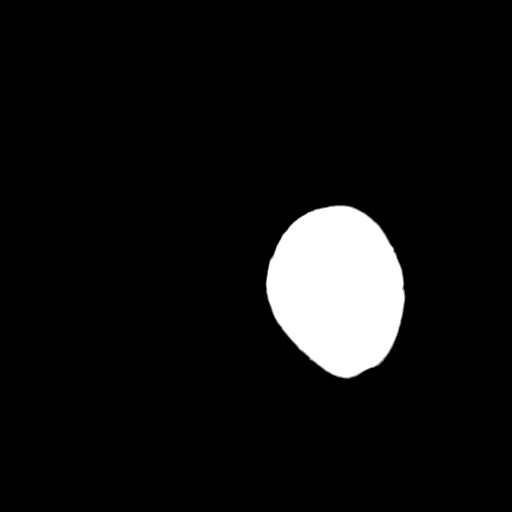

[Series 5: cervical st 2.0 b31s · axial · 0.37mm/px · z∈[+51,+67]mm · 2 of 77 slices shown]
[im 9/77  brain]
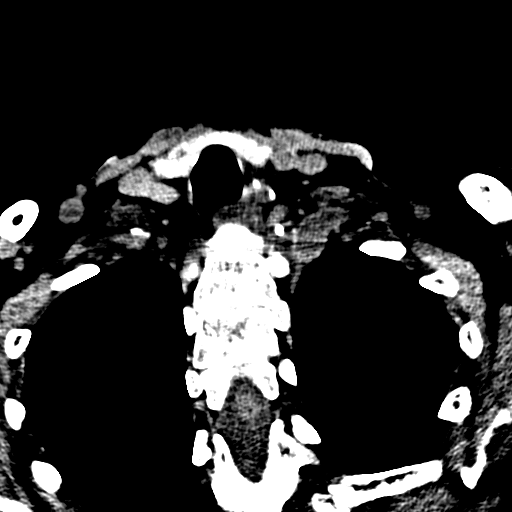
[im 17/77  brain]
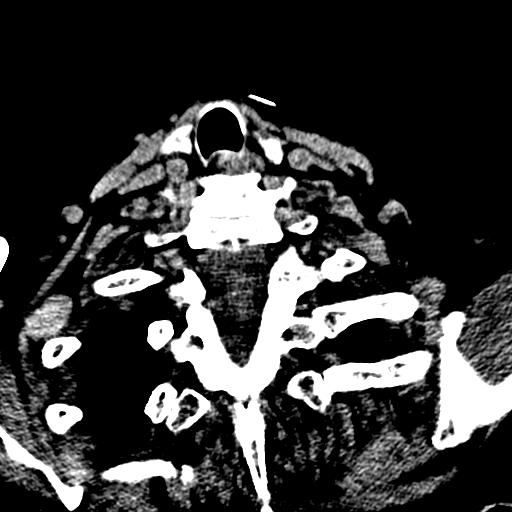

[Series 7: sagittal bone 2.0 · sagittal · 0.27mm/px · 3 of 59 slices shown]
[im 20/59  brain]
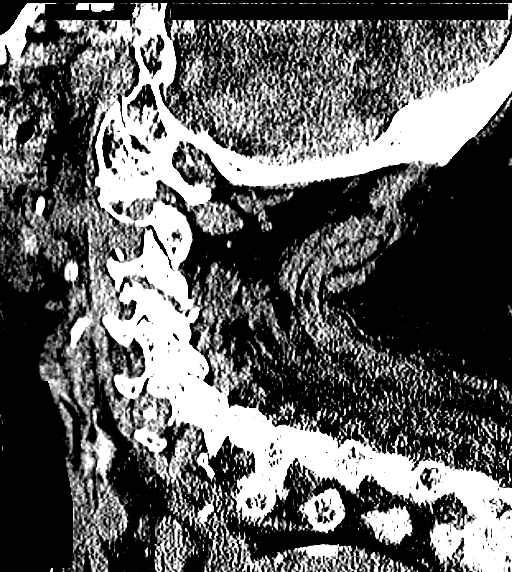
[im 30/59  brain]
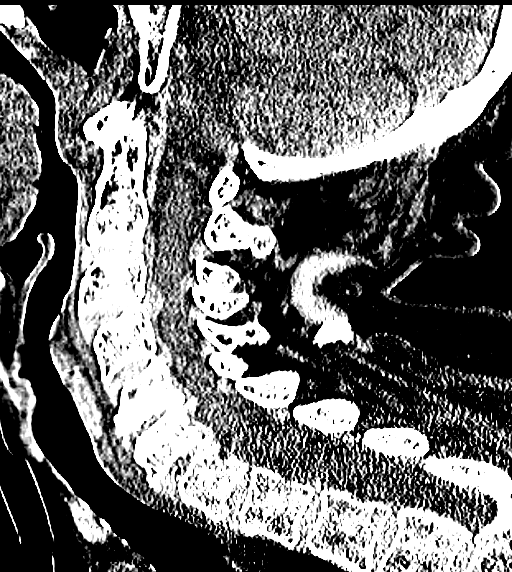
[im 39/59  brain]
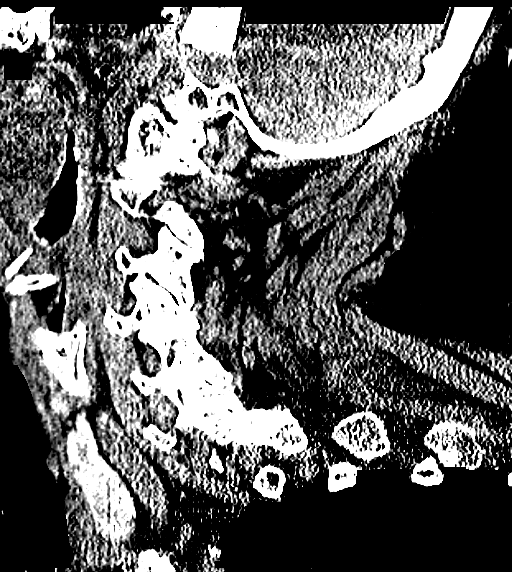

[Series 8: coronal bone 2.0 · coronal · 0.32mm/px · 3 of 52 slices shown]
[im 18/52  brain]
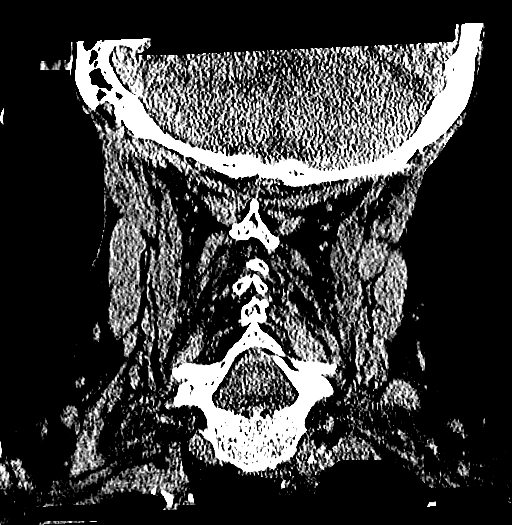
[im 23/52  brain]
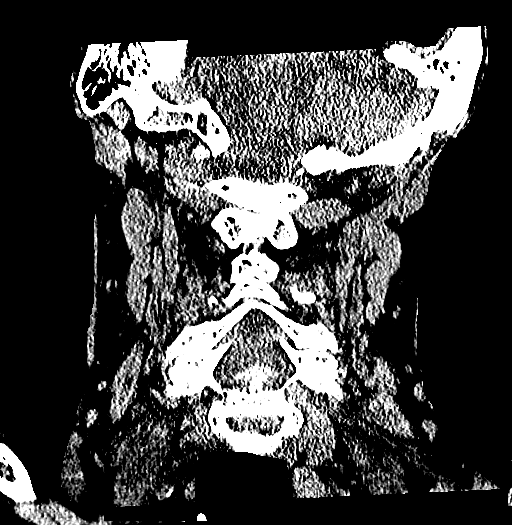
[im 29/52  brain]
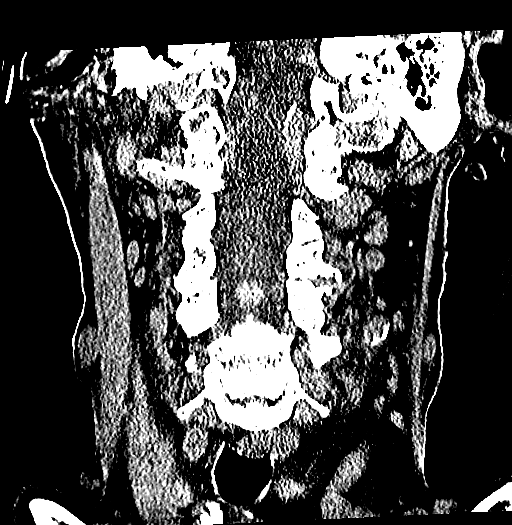

[16 of 47 positions shown; findings below may reference images not displayed]

FINDINGS: CT HEAD FINDINGS

Ventricles are normal in overall configuration. There is ventricular
and sulcal enlargement reflecting moderate atrophy. No parenchymal
masses or mass effect. No evidence of an infarct. There are patchy
areas of white matter hypoattenuation consistent with chronic
microvascular ischemic change. Stable parenchymal calcifications are
noted on the right.

No extra-axial masses or abnormal fluid collections.

No intracranial hemorrhage.

There is a prosthetic right globe.

Sinuses and mastoid air cells are clear.  No skull fracture.

CT CERVICAL SPINE FINDINGS

No fracture. No spondylolisthesis. Moderate loss disc height at
C5-C6 with endplate sclerosis and osteophytes. Minor loss of disc
height at C6-C7. There are facet degenerative changes most prominent
on the left at C3-C4. There are mild degrees of neural foraminal
narrowing most evident at C5-C6. Bones are diffusely demineralized.

No soft tissue masses or adenopathy. There are carotid vascular
calcifications. A few small low-density areas are noted in the
thyroid gland consistent with sub cm nodules. Lung apices show mild
interstitial thickening and scarring but are otherwise clear.
IMPRESSION: HEAD CT: No acute intracranial abnormalities. Atrophy and chronic
microvascular ischemic change.

CERVICAL CT:  No fracture or acute finding.

## 2016-01-28 IMAGING — CR DG PORTABLE PELVIS
1 series · 1 of 1 positions shown · non-contrast
Comparison: Lumbar spine series of May 07, 2010

CLINICAL DATA: Status post unwitnessed fall; found on the floor by
the bed

EXAM:
PORTABLE PELVIS 1-2 VIEWS

[ap]
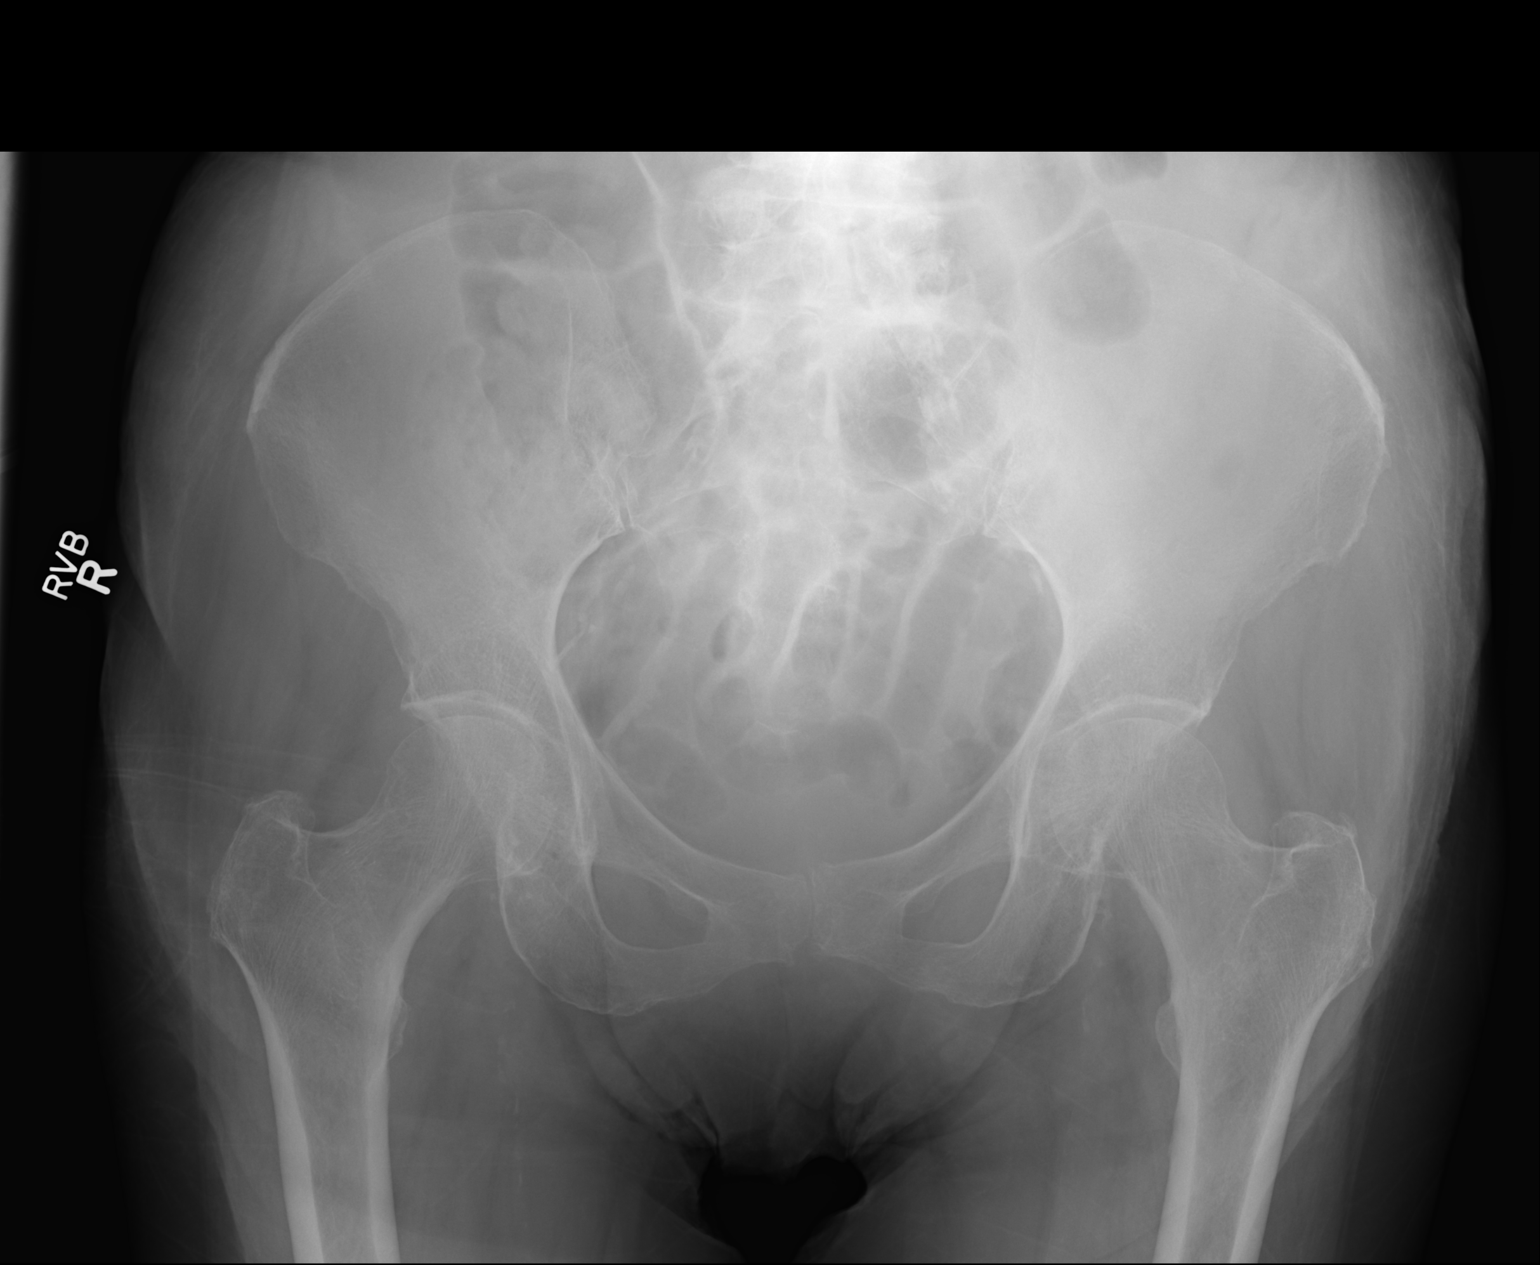

[1 of 1 positions shown; findings below may reference images not displayed]

FINDINGS: The bony pelvis is osteopenic. There is no acute fracture. The hip
joint spaces are reasonably well maintained. The femoral heads and
necks and intertrochanteric regions are grossly normal. The sacrum
is obscured by bowel gas. There are mild degenerative changes of the
lumbar spine.
IMPRESSION: There is no acute bony abnormality of the pelvis.

## 2016-01-28 IMAGING — CR DG CHEST 1V PORT
1 series · 1 of 1 positions shown · non-contrast
Comparison: 02/27/2013.

CLINICAL DATA: Fell.

EXAM:
PORTABLE CHEST - 1 VIEW

[pa]
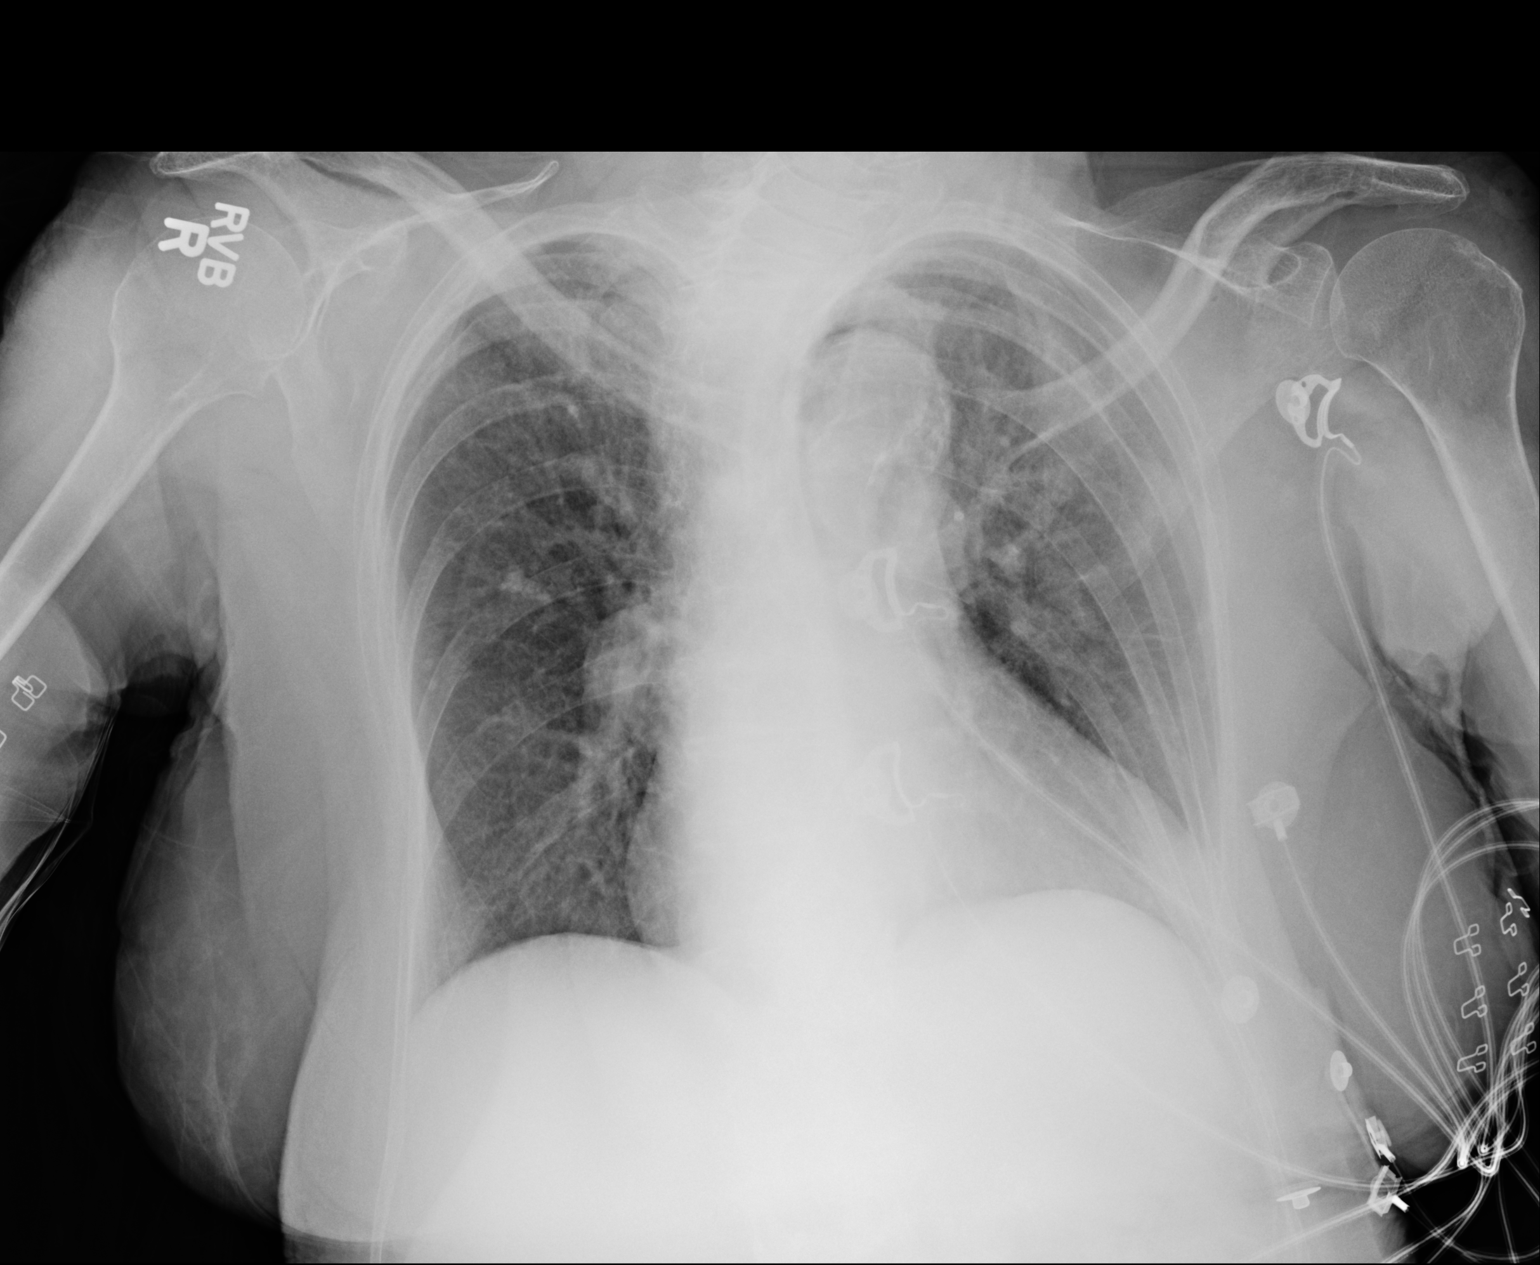

[1 of 1 positions shown; findings below may reference images not displayed]

FINDINGS: Stable enlarged cardiac silhouette. Clear lungs. Diffusely prominent
interstitial markings. No fracture or pneumothorax seen.
IMPRESSION: No acute abnormality. Cardiomegaly and chronic interstitial lung
disease.
